# Patient Record
Sex: Male | Born: 1950 | Race: White | Hispanic: No | Marital: Married | Smoking: Never smoker
Health system: Southern US, Community
[De-identification: ages and names within clinical notes are randomized; demographics above are authoritative.]

## PROBLEM LIST (undated history)

## (undated) DIAGNOSIS — E872 Acidosis, unspecified: Secondary | ICD-10-CM

## (undated) DIAGNOSIS — Z87442 Personal history of urinary calculi: Secondary | ICD-10-CM

## (undated) DIAGNOSIS — I209 Angina pectoris, unspecified: Secondary | ICD-10-CM

## (undated) DIAGNOSIS — R296 Repeated falls: Secondary | ICD-10-CM

## (undated) DIAGNOSIS — I255 Ischemic cardiomyopathy: Secondary | ICD-10-CM

## (undated) DIAGNOSIS — K810 Acute cholecystitis: Secondary | ICD-10-CM

## (undated) DIAGNOSIS — I509 Heart failure, unspecified: Secondary | ICD-10-CM

## (undated) DIAGNOSIS — M545 Low back pain, unspecified: Secondary | ICD-10-CM

## (undated) DIAGNOSIS — K219 Gastro-esophageal reflux disease without esophagitis: Secondary | ICD-10-CM

## (undated) DIAGNOSIS — I2510 Atherosclerotic heart disease of native coronary artery without angina pectoris: Secondary | ICD-10-CM

## (undated) DIAGNOSIS — L03211 Cellulitis of face: Secondary | ICD-10-CM

## (undated) DIAGNOSIS — I10 Essential (primary) hypertension: Secondary | ICD-10-CM

## (undated) DIAGNOSIS — E119 Type 2 diabetes mellitus without complications: Secondary | ICD-10-CM

## (undated) DIAGNOSIS — E113492 Type 2 diabetes mellitus with severe nonproliferative diabetic retinopathy without macular edema, left eye: Secondary | ICD-10-CM

## (undated) DIAGNOSIS — R7989 Other specified abnormal findings of blood chemistry: Secondary | ICD-10-CM

## (undated) DIAGNOSIS — Z87820 Personal history of traumatic brain injury: Secondary | ICD-10-CM

## (undated) DIAGNOSIS — I639 Cerebral infarction, unspecified: Secondary | ICD-10-CM

## (undated) DIAGNOSIS — G629 Polyneuropathy, unspecified: Secondary | ICD-10-CM

## (undated) DIAGNOSIS — G8929 Other chronic pain: Secondary | ICD-10-CM

## (undated) DIAGNOSIS — E113391 Type 2 diabetes mellitus with moderate nonproliferative diabetic retinopathy without macular edema, right eye: Secondary | ICD-10-CM

## (undated) HISTORY — PX: CORONARY ARTERY BYPASS GRAFT: SHX141

## (undated) SURGERY — LOWER EXTREMITY ANGIOGRAPHY
Anesthesia: Moderate Sedation | Laterality: Right

---

## 2005-05-24 ENCOUNTER — Ambulatory Visit: Payer: Self-pay | Admitting: Family Medicine

## 2008-01-25 ENCOUNTER — Other Ambulatory Visit: Payer: Self-pay

## 2008-01-25 ENCOUNTER — Observation Stay: Payer: Self-pay | Admitting: Internal Medicine

## 2009-03-03 ENCOUNTER — Emergency Department: Payer: Self-pay | Admitting: Emergency Medicine

## 2009-03-05 ENCOUNTER — Emergency Department: Payer: Self-pay | Admitting: Emergency Medicine

## 2009-11-19 ENCOUNTER — Emergency Department: Payer: Self-pay | Admitting: Internal Medicine

## 2010-08-27 DIAGNOSIS — E119 Type 2 diabetes mellitus without complications: Secondary | ICD-10-CM

## 2010-08-27 DIAGNOSIS — E1169 Type 2 diabetes mellitus with other specified complication: Secondary | ICD-10-CM

## 2012-10-24 DIAGNOSIS — I639 Cerebral infarction, unspecified: Secondary | ICD-10-CM

## 2012-10-24 HISTORY — DX: Cerebral infarction, unspecified: I63.9

## 2014-07-04 ENCOUNTER — Emergency Department: Payer: Self-pay | Admitting: Emergency Medicine

## 2014-07-20 ENCOUNTER — Emergency Department: Payer: Self-pay | Admitting: Emergency Medicine

## 2014-08-27 ENCOUNTER — Ambulatory Visit: Payer: Self-pay | Admitting: Nurse Practitioner

## 2014-08-29 ENCOUNTER — Ambulatory Visit: Payer: Self-pay | Admitting: Nurse Practitioner

## 2014-10-16 ENCOUNTER — Ambulatory Visit: Payer: Self-pay | Admitting: Vascular Surgery

## 2015-10-25 DIAGNOSIS — Z951 Presence of aortocoronary bypass graft: Secondary | ICD-10-CM

## 2015-10-25 HISTORY — PX: CORONARY ARTERY BYPASS GRAFT: SHX141

## 2015-10-25 HISTORY — DX: Presence of aortocoronary bypass graft: Z95.1

## 2015-12-11 DIAGNOSIS — E113492 Type 2 diabetes mellitus with severe nonproliferative diabetic retinopathy without macular edema, left eye: Secondary | ICD-10-CM | POA: Insufficient documentation

## 2015-12-11 DIAGNOSIS — E113391 Type 2 diabetes mellitus with moderate nonproliferative diabetic retinopathy without macular edema, right eye: Secondary | ICD-10-CM | POA: Insufficient documentation

## 2016-07-29 DIAGNOSIS — I420 Dilated cardiomyopathy: Secondary | ICD-10-CM | POA: Insufficient documentation

## 2016-07-29 DIAGNOSIS — I255 Ischemic cardiomyopathy: Secondary | ICD-10-CM | POA: Insufficient documentation

## 2016-08-09 ENCOUNTER — Encounter
Admission: RE | Disposition: A | Discharge status: Home / Self Care | Payer: Self-pay | Source: Ambulatory Visit | Attending: Cardiology

## 2016-08-09 ENCOUNTER — Other Ambulatory Visit: Payer: Self-pay | Admitting: Cardiology

## 2016-08-09 ENCOUNTER — Ambulatory Visit
Admission: RE | Admit: 2016-08-09 | Discharge: 2016-08-09 | Disposition: A | Discharge status: Home / Self Care | Payer: BLUE CROSS/BLUE SHIELD | Source: Ambulatory Visit | Attending: Cardiology | Admitting: Cardiology

## 2016-08-09 ENCOUNTER — Ambulatory Visit: Admit: 2016-08-09 | Payer: BLUE CROSS/BLUE SHIELD | Admitting: Cardiology

## 2016-08-09 DIAGNOSIS — I255 Ischemic cardiomyopathy: Secondary | ICD-10-CM | POA: Insufficient documentation

## 2016-08-09 DIAGNOSIS — E113391 Type 2 diabetes mellitus with moderate nonproliferative diabetic retinopathy without macular edema, right eye: Secondary | ICD-10-CM | POA: Insufficient documentation

## 2016-08-09 DIAGNOSIS — Z8673 Personal history of transient ischemic attack (TIA), and cerebral infarction without residual deficits: Secondary | ICD-10-CM | POA: Diagnosis not present

## 2016-08-09 DIAGNOSIS — R809 Proteinuria, unspecified: Secondary | ICD-10-CM | POA: Insufficient documentation

## 2016-08-09 DIAGNOSIS — I420 Dilated cardiomyopathy: Secondary | ICD-10-CM | POA: Diagnosis not present

## 2016-08-09 DIAGNOSIS — I2510 Atherosclerotic heart disease of native coronary artery without angina pectoris: Secondary | ICD-10-CM | POA: Insufficient documentation

## 2016-08-09 DIAGNOSIS — Z7982 Long term (current) use of aspirin: Secondary | ICD-10-CM | POA: Diagnosis not present

## 2016-08-09 DIAGNOSIS — Z794 Long term (current) use of insulin: Secondary | ICD-10-CM | POA: Insufficient documentation

## 2016-08-09 DIAGNOSIS — R079 Chest pain, unspecified: Secondary | ICD-10-CM | POA: Diagnosis present

## 2016-08-09 DIAGNOSIS — E113492 Type 2 diabetes mellitus with severe nonproliferative diabetic retinopathy without macular edema, left eye: Secondary | ICD-10-CM | POA: Diagnosis not present

## 2016-08-09 DIAGNOSIS — E1169 Type 2 diabetes mellitus with other specified complication: Secondary | ICD-10-CM | POA: Diagnosis not present

## 2016-08-09 DIAGNOSIS — I10 Essential (primary) hypertension: Secondary | ICD-10-CM | POA: Insufficient documentation

## 2016-08-09 DIAGNOSIS — Z79899 Other long term (current) drug therapy: Secondary | ICD-10-CM | POA: Diagnosis not present

## 2016-08-09 DIAGNOSIS — I2582 Chronic total occlusion of coronary artery: Secondary | ICD-10-CM | POA: Insufficient documentation

## 2016-08-09 HISTORY — PX: CARDIAC CATHETERIZATION: SHX172

## 2016-08-09 HISTORY — DX: Atherosclerotic heart disease of native coronary artery without angina pectoris: I25.10

## 2016-08-09 HISTORY — DX: Cerebral infarction, unspecified: I63.9

## 2016-08-09 HISTORY — DX: Type 2 diabetes mellitus without complications: E11.9

## 2016-08-09 HISTORY — DX: Angina pectoris, unspecified: I20.9

## 2016-08-09 HISTORY — DX: Essential (primary) hypertension: I10

## 2016-08-09 SURGERY — LEFT HEART CATH AND CORONARY ANGIOGRAPHY
Anesthesia: Moderate Sedation | Laterality: Left

## 2016-08-09 SURGERY — LEFT HEART CATH
Anesthesia: Moderate Sedation

## 2016-08-09 MED ORDER — FUROSEMIDE 10 MG/ML IJ SOLN
INTRAMUSCULAR | Status: DC | PRN
  Administered 2016-08-09: 20 mg via INTRAVENOUS

## 2016-08-09 MED ORDER — SODIUM CHLORIDE 0.9% FLUSH: 3.0000 mL | INTRAVENOUS | Status: DC | PRN

## 2016-08-09 MED ORDER — SODIUM CHLORIDE 0.9 % WEIGHT BASED INFUSION: 1.0000 mL/kg/h | INTRAVENOUS | Status: DC

## 2016-08-09 MED ORDER — NITROGLYCERIN 2 % TD OINT
TOPICAL_OINTMENT | TRANSDERMAL | Status: DC | PRN
  Administered 2016-08-09: 2 [in_us] via TOPICAL

## 2016-08-09 MED ORDER — ASPIRIN EC 325 MG PO TBEC: 325.0000 mg | DELAYED_RELEASE_TABLET | Freq: Every day | ORAL | Status: DC

## 2016-08-09 MED ORDER — FUROSEMIDE 10 MG/ML IJ SOLN
INTRAMUSCULAR | Status: AC
  Filled 2016-08-09: qty 2

## 2016-08-09 MED ORDER — MIDAZOLAM HCL 2 MG/2ML IJ SOLN
INTRAMUSCULAR | Status: DC | PRN
  Administered 2016-08-09: 0.5 mg via INTRAVENOUS
  Administered 2016-08-09: 1.5 mg

## 2016-08-09 MED ORDER — SODIUM CHLORIDE 0.9 % IV SOLN: 250.0000 mL | INTRAVENOUS | Status: DC | PRN

## 2016-08-09 MED ORDER — SODIUM CHLORIDE 0.9% FLUSH: 3.0000 mL | Freq: Two times a day (BID) | INTRAVENOUS | Status: DC

## 2016-08-09 MED ORDER — IOPAMIDOL (ISOVUE-300) INJECTION 61%
INTRAVENOUS | Status: DC | PRN
  Administered 2016-08-09: 100 mL via INTRA_ARTERIAL

## 2016-08-09 MED ORDER — ONDANSETRON HCL 4 MG/2ML IJ SOLN: 4.0000 mg | Freq: Four times a day (QID) | INTRAMUSCULAR | Status: DC | PRN

## 2016-08-09 MED ORDER — MIDAZOLAM HCL 2 MG/2ML IJ SOLN
INTRAMUSCULAR | Status: AC
Start: 2016-08-09 — End: 2016-08-09
  Filled 2016-08-09: qty 2

## 2016-08-09 MED ORDER — FENTANYL CITRATE (PF) 100 MCG/2ML IJ SOLN
INTRAMUSCULAR | Status: DC | PRN
  Administered 2016-08-09: 25 ug
  Administered 2016-08-09: 75 ug

## 2016-08-09 MED ORDER — FENTANYL CITRATE (PF) 100 MCG/2ML IJ SOLN
INTRAMUSCULAR | Status: AC
  Filled 2016-08-09: qty 2

## 2016-08-09 MED ORDER — ASPIRIN 81 MG PO CHEW
81.0000 mg | CHEWABLE_TABLET | ORAL | Status: DC
Start: 2016-08-10 — End: 2016-08-09

## 2016-08-09 MED ORDER — ACETAMINOPHEN 325 MG PO TABS: 650.0000 mg | ORAL_TABLET | ORAL | Status: DC | PRN

## 2016-08-09 MED ORDER — ATORVASTATIN CALCIUM 20 MG PO TABS: 80.0000 mg | ORAL_TABLET | Freq: Every day | ORAL | Status: DC

## 2016-08-09 MED ORDER — SODIUM CHLORIDE 0.9 % IV SOLN: INTRAVENOUS | Status: DC

## 2016-08-09 MED ORDER — HEPARIN (PORCINE) IN NACL 2-0.9 UNIT/ML-% IJ SOLN
INTRAMUSCULAR | Status: AC
Start: 2016-08-09 — End: 2016-08-09
  Filled 2016-08-09: qty 500

## 2016-08-09 MED ORDER — NITROGLYCERIN 2 % TD OINT
TOPICAL_OINTMENT | TRANSDERMAL | Status: AC
Start: 2016-08-09 — End: 2016-08-09
  Filled 2016-08-09: qty 1

## 2016-08-09 SURGICAL SUPPLY — 8 items
CATH 5FR JL4 DIAGNOSTIC (CATHETERS) ×3 IMPLANT
CATH 5FR PIGTAIL DIAGNOSTIC (CATHETERS) ×2 IMPLANT
CATH INFINITI JR4 5F (CATHETERS) ×3 IMPLANT
KIT MANI 3VAL PERCEP (MISCELLANEOUS) ×3 IMPLANT
NEEDLE PERC 18GX7CM (NEEDLE) ×3 IMPLANT
PACK CARDIAC CATH (CUSTOM PROCEDURE TRAY) ×3 IMPLANT
SHEATH AVANTI 5FR X 11CM (SHEATH) ×3 IMPLANT
WIRE EMERALD 3MM-J .035X150CM (WIRE) ×3 IMPLANT

## 2016-08-09 NOTE — H&P (Signed)
Chief Complaint: Chief Complaint  Patient presents with  . Follow-up  stress echo  Date of Service: 07/29/2016 Date of Birth: [birth year] PCP: Maralyn Sago Burnis Medin, NP  History of Present Illness: [name] is a 65 y.o.male patient who presents for evaluation. Has history of exertional chest pain and shortness of breath. Patient underwent a stress echo which revealed reduced LV function EF 25%. EF reduces globally with exercise. He has mild MR trivial TR. Etiology of this is unclear. Will need cardiac catheterization to evaluate coronary anatomy as etiology. Risk and benefits of left cardiac catheterization were explained. Will need to improve medical therapy including evidence-based beta-blocker and afterload reduction. Low-sodium diet is recommended along with daily weights. Past Medical and Surgical History  Past Medical History Past Medical History:  Diagnosis Date  . Carotid artery stenosis 11/15  . Chronic lower back pain  w/ weakness down Rt leg & inability to flex Rt foot  . Concussion 09/15  2/2 slip & fall  . Diabetes mellitus, type 2 (CMS-HCC) ~2006  . Frequent falls  2/2 inability to flex Rt foot  . Hypertension ~2013  . Microalbuminuria due to type 2 diabetes mellitus (CMS-HCC)  . 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 Followed by [facility]  . Protrusion of lumbar intervertebral disc 11/15  w/ spinal stenosis. L3-S1  . Severe nonproliferative diabetic retinopathy of left eye (CMS-HCC) 12/11/2015  s/p antiveg-f injections. Followed by [facility].  . Stroke (CMS-HCC) June 2014  Lt parietal lobe lesion seen on Head CT, but no lesions seen on flu MRI.   Past Surgical History He has no past surgical history on file.   Medications and Allergies  Current Medications  Current Outpatient Prescriptions  Medication Sig Dispense Refill  . aspirin 81 MG EC tablet Take 81 mg by mouth once daily.  . BD  INSULIN SYRINGE HALF UNIT 0.3 mL 31 gauge x 5/16" Syrg Inject 1 Syringe subcutaneously 2 (two) times daily. 100 Syringe 5  . enalapril (VASOTEC) 10 MG tablet TAKE ONE (1) TABLET BY MOUTH EVERY DAY 90 tablet 1  . FREESTYLE LITE STRIPS test strip USE TWICE A DAY (TEST IN ROTATING PATTERN THROUGHOUT DAY) 100 each 5  . ibuprofen (ADVIL,MOTRIN) 200 MG tablet Take 200 mg by mouth every 6 (six) hours as needed for Pain.  . metFORMIN (GLUCOPHAGE) 1000 MG tablet TAKE ONE TABLET BY MOUTH TWICE DAILY WITH MEALS 180 tablet 1  . NOVOLIN 70/30 100 unit/mL (70-30) injection INJECT 20-24 UNITS SUBQ TWICE DAILY BEFORE MEALS. 24 UNITS BEFORE BREAKFAST, 20 UNITS BEFORE SUPPER 40 mL 6  . carvedilol (COREG) 3.125 MG tablet Take 1 tablet (3.125 mg total) by mouth 2 (two) times daily with meals. 60 tablet 11   No current facility-administered medications for this visit.   Allergies: Review of patient's allergies indicates no known allergies.  Social and Family History  Social History reports that he has never smoked. He has never used smokeless tobacco. He reports that he does not drink alcohol or use illicit drugs.  Family History Family History  Problem Relation Age of Onset  . Diabetes type II Mother  . Hypertension Mother  . Stroke Mother  Cause of death vs DM coma  . Alcohol abuse Father  . Heart attack Brother  . Coronary artery disease Brother  s/p CABG x3 (11/15)   Review of Systems  Review of Systems  Constitutional: Negative for chills, diaphoresis, fever, malaise/fatigue and weight loss.  HENT: Negative for congestion, ear discharge, hearing loss and tinnitus.  Eyes: Negative for blurred vision.  Respiratory: Positive for shortness of breath. Negative for cough, hemoptysis, sputum production and wheezing.  Cardiovascular: Positive for chest pain. Negative for palpitations, orthopnea, claudication, leg swelling and PND.  Gastrointestinal: Negative for abdominal pain, blood in stool,  constipation, diarrhea, heartburn, melena, nausea and vomiting.  Genitourinary: Negative for dysuria, frequency, hematuria and urgency.  Musculoskeletal: Negative for back pain, falls, joint pain and myalgias.  Skin: Negative for itching and rash.  Neurological: Negative for dizziness, tingling, focal weakness, loss of consciousness, weakness and headaches.  Endo/Heme/Allergies: Negative for polydipsia. Does not bruise/bleed easily.  Psychiatric/Behavioral: Negative for depression, memory loss and substance abuse. The patient is not nervous/anxious.   Physical Examination   Vitals:BP 140/82  Pulse 76  Resp 12  Ht 182.9 cm (6')  Wt (!) 115.7 kg (255 lb)  BMI 34.58 kg/m2 Ht:182.9 cm (6') Wt:(!) 115.7 kg (255 lb) ZOX:WRUE surface area is 2.42 meters squared. Body mass index is 34.58 kg/(m^2).  Wt Readings from Last 3 Encounters:  07/29/16 (!) 115.7 kg (255 lb)  07/21/16 (!) 115.7 kg (255 lb)  06/03/16 (!) 113.9 kg (251 lb)   BP Readings from Last 3 Encounters:  07/29/16 140/82  07/21/16 148/80  06/03/16 120/74   General appearance appears in no acute distress  Head Mouth and Eye exam Normocephalic, without obvious abnormality, atraumatic Dentition is good Eyes appear anicteric   Neck exam Thyroid: normal  Nodes: no obvious adenopathy  LUNGS Breath Sounds: Normal Percussion: Normal  CARDIOVASCULAR JVP CV wave: no HJR: no Elevation at 90 degrees: None Carotid Pulse: normal pulsation bilaterally Bruit: None Apex: apical impulse normal  Auscultation Rhythm: normal sinus rhythm S1: normal S2: normal Clicks: no Rub: no Murmurs: no murmurs  Gallop: None ABDOMEN Liver enlargement: no Pulsatile aorta: no Ascites: no Bruits: no  EXTREMITIES Clubbing: no Edema: trace to 1+ bilateral pedal edema Pulses: peripheral pulses symmetrical Femoral Bruits: no Amputation: no SKIN Rash: no Cyanosis: no Embolic phemonenon: no Bruising: no NEURO Alert and  Oriented to person, place and time: yes Non focal: yes  PSYCH: Pt appears to have normal affect  LABS REVIEWED Last 3 CBC results: Lab Results  Component Value Date  WBC 7.6 07/29/2016  WBC 6.9 11/25/2015  WBC 8.2 08/20/2014   Lab Results  Component Value Date  HGB 14.7 07/29/2016  HGB 13.9 (L) 11/25/2015  HGB 14.8 08/20/2014   Lab Results  Component Value Date  HCT 41.1 07/29/2016  HCT 39.9 (L) 11/25/2015  HCT 41.9 08/20/2014   Lab Results  Component Value Date  PLT 196 07/29/2016  PLT 206 11/25/2015  PLT 228 08/20/2014   Lab Results  Component Value Date  CREATININE 0.9 07/29/2016  BUN 21 07/29/2016  NA 136 07/29/2016  K 4.7 07/29/2016  CL 102 07/29/2016  CO2 27.9 07/29/2016   Lab Results  Component Value Date  HGBA1C 8.5 (H) 06/03/2016   Lab Results  Component Value Date  HDL 47.2 11/25/2015  HDL 37.0 08/20/2014   Lab Results  Component Value Date  LDLCALC 68 11/25/2015  LDLCALC 72 08/20/2014   Lab Results  Component Value Date  TRIG 75 11/25/2015  TRIG 131 08/20/2014   Lab Results  Component Value Date  ALT 30 11/25/2015  AST 23 11/25/2015  ALKPHOS 55 11/25/2015   Lab Results  Component Value Date  TSH 2.469 11/25/2015   Diagnostic Studies Reviewed:  EKG EKG demonstrated normal sinus rhythm, nonspecific  ST and T waves changes, Q waves in Inferior leads.  Assessment and Plan   65 y.o. male with  ICD-10-CM ICD-9-CM  1. Essential hypertension-low-sodium diet. Will follow for now I10 401.9  2. SOB (shortness of breath) on exertion-EF 25%. This is global but worsens with exercise. Concern over possible three-vessel coronary disease as etiology. We will proceed with left cardiac catheterization to evaluate. Will start on carvedilol at 3.125 mg twice daily and enalapril at 10 mg daily which had been previously started. R06.02 786.05  3. Type 2 diabetes mellitus without complication, without long-term current use of insulin  (CMS-HCC)-continue with metformin and insulin. Will hold metformin prior to catheterization until renal function can be documented post procedure. E11.9 250.00  4. Atypical chest pain--EF is reduced to 25%. There is some worsening with exercise. Concerned over possible ischemic etiology. Will treat with aspirin, evidence-based beta-blocker and afterload reduction proceed with left cardiac catheterization to evaluate coronary anatomy with further recognitions after this is complete. Will need to add a statin however will do this after the cardiac catheterization so that we are not starting 2 medications at once. Given his diabetes regardless of the coronary anatomy, his LDL needs to be less than 409. His current LDL is 68. R07.89 786.59   5. Essential (primary) hypertension-treated with carvedilol and enalapril and low-sodium DASH diet. I10 401.9  6. Cardiomyopathy, dilated (CMS-HCC)-we will pursue left cardiac catheterization to evaluate. As per above, carvedilol and ACE inhibitor. I42.0 425.4   Return in about 4 weeks (around 08/26/2016).  These notes generated with voice recognition software. I apologize for typographical errors.  Denton Ar, MD    H and P reviewed. No changes.

## 2016-08-09 NOTE — Discharge Instructions (Signed)
Angiogram, Care After °Refer to this sheet in the next few weeks. These instructions provide you with information about caring for yourself after your procedure. Your health care provider may also give you more specific instructions. Your treatment has been planned according to current medical practices, but problems sometimes occur. Call your health care provider if you have any problems or questions after your procedure. °WHAT TO EXPECT AFTER THE PROCEDURE °After your procedure, it is typical to have the following: °· Bruising at the catheter insertion site that usually fades within 1-2 weeks. °· Blood collecting in the tissue (hematoma) that may be painful to the touch. It should usually decrease in size and tenderness within 1-2 weeks. °HOME CARE INSTRUCTIONS °· Take medicines only as directed by your health care provider. °· You may shower 24-48 hours after the procedure or as directed by your health care provider. Remove the bandage (dressing) and gently wash the site with plain soap and water. Pat the area dry with a clean towel. Do not rub the site, because this may cause bleeding. °· Do not take baths, swim, or use a hot tub until your health care provider approves. °· Check your insertion site every day for redness, swelling, or drainage. °· Do not apply powder or lotion to the site. °· Do not lift over 10 lb (4.5 kg) for 5 days after your procedure or as directed by your health care provider. °· Ask your health care provider when it is okay to: °¨ Return to work or school. °¨ Resume usual physical activities or sports. °¨ Resume sexual activity. °· Do not drive home if you are discharged the same day as the procedure. Have someone else drive you. °· You may drive 24 hours after the procedure unless otherwise instructed by your health care provider. °· Do not operate machinery or power tools for 24 hours after the procedure or as directed by your health care provider. °· If your procedure was done as an  outpatient procedure, which means that you went home the same day as your procedure, a responsible adult should be with you for the first 24 hours after you arrive home. °· Keep all follow-up visits as directed by your health care provider. This is important. °SEEK MEDICAL CARE IF: °· You have a fever. °· You have chills. °· You have increased bleeding from the catheter insertion site. Hold pressure on the site. °SEEK IMMEDIATE MEDICAL CARE IF: °· You have unusual pain at the catheter insertion site. °· You have redness, warmth, or swelling at the catheter insertion site. °· You have drainage (other than a small amount of blood on the dressing) from the catheter insertion site. °· The catheter insertion site is bleeding, and the bleeding does not stop after 30 minutes of holding steady pressure on the site. °· The area near or just beyond the catheter insertion site becomes pale, cool, tingly, or numb. °  °This information is not intended to replace advice given to you by your health care provider. Make sure you discuss any questions you have with your health care provider. °  °Document Released: 04/28/2005 Document Revised: 10/31/2014 Document Reviewed: 03/13/2013 °Elsevier Interactive Patient Education ©2016 [employer] ° °

## 2016-08-10 ENCOUNTER — Encounter: Payer: Self-pay | Admitting: Cardiology

## 2016-08-24 DIAGNOSIS — I5022 Chronic systolic (congestive) heart failure: Secondary | ICD-10-CM | POA: Diagnosis present

## 2018-01-01 ENCOUNTER — Other Ambulatory Visit: Payer: Self-pay | Admitting: Student

## 2018-01-01 DIAGNOSIS — M5416 Radiculopathy, lumbar region: Secondary | ICD-10-CM

## 2018-01-10 ENCOUNTER — Ambulatory Visit
Admission: RE | Admit: 2018-01-10 | Discharge: 2018-01-10 | Disposition: A | Discharge status: Home / Self Care | Payer: Medicare Other | Source: Ambulatory Visit | Attending: Student | Admitting: Student

## 2018-01-10 DIAGNOSIS — M5127 Other intervertebral disc displacement, lumbosacral region: Secondary | ICD-10-CM | POA: Insufficient documentation

## 2018-01-10 DIAGNOSIS — M48061 Spinal stenosis, lumbar region without neurogenic claudication: Secondary | ICD-10-CM | POA: Diagnosis not present

## 2018-01-10 DIAGNOSIS — M5116 Intervertebral disc disorders with radiculopathy, lumbar region: Secondary | ICD-10-CM | POA: Diagnosis not present

## 2018-01-10 DIAGNOSIS — M5416 Radiculopathy, lumbar region: Secondary | ICD-10-CM

## 2018-01-24 DIAGNOSIS — G629 Polyneuropathy, unspecified: Secondary | ICD-10-CM | POA: Insufficient documentation

## 2019-03-16 ENCOUNTER — Emergency Department: Payer: Medicare Other

## 2019-03-16 ENCOUNTER — Encounter: Payer: Self-pay | Admitting: Emergency Medicine

## 2019-03-16 ENCOUNTER — Inpatient Hospital Stay
Admission: EM | Admit: 2019-03-16 | Discharge: 2019-03-18 | DRG: 603 | Disposition: A | Discharge status: Home / Self Care | Payer: Medicare Other | Attending: Internal Medicine | Admitting: Internal Medicine

## 2019-03-16 ENCOUNTER — Other Ambulatory Visit: Payer: Self-pay

## 2019-03-16 DIAGNOSIS — L03211 Cellulitis of face: Principal | ICD-10-CM | POA: Diagnosis present

## 2019-03-16 DIAGNOSIS — E871 Hypo-osmolality and hyponatremia: Secondary | ICD-10-CM | POA: Diagnosis present

## 2019-03-16 DIAGNOSIS — L03213 Periorbital cellulitis: Secondary | ICD-10-CM

## 2019-03-16 DIAGNOSIS — Z794 Long term (current) use of insulin: Secondary | ICD-10-CM

## 2019-03-16 DIAGNOSIS — R112 Nausea with vomiting, unspecified: Secondary | ICD-10-CM | POA: Diagnosis not present

## 2019-03-16 DIAGNOSIS — E1165 Type 2 diabetes mellitus with hyperglycemia: Secondary | ICD-10-CM | POA: Diagnosis present

## 2019-03-16 DIAGNOSIS — Z791 Long term (current) use of non-steroidal anti-inflammatories (NSAID): Secondary | ICD-10-CM

## 2019-03-16 DIAGNOSIS — I509 Heart failure, unspecified: Secondary | ICD-10-CM | POA: Diagnosis present

## 2019-03-16 DIAGNOSIS — I2510 Atherosclerotic heart disease of native coronary artery without angina pectoris: Secondary | ICD-10-CM | POA: Diagnosis present

## 2019-03-16 DIAGNOSIS — Z79899 Other long term (current) drug therapy: Secondary | ICD-10-CM

## 2019-03-16 DIAGNOSIS — K529 Noninfective gastroenteritis and colitis, unspecified: Secondary | ICD-10-CM | POA: Diagnosis present

## 2019-03-16 DIAGNOSIS — R21 Rash and other nonspecific skin eruption: Secondary | ICD-10-CM | POA: Diagnosis present

## 2019-03-16 DIAGNOSIS — M21371 Foot drop, right foot: Secondary | ICD-10-CM | POA: Diagnosis present

## 2019-03-16 DIAGNOSIS — Z1159 Encounter for screening for other viral diseases: Secondary | ICD-10-CM

## 2019-03-16 DIAGNOSIS — E860 Dehydration: Secondary | ICD-10-CM | POA: Diagnosis present

## 2019-03-16 DIAGNOSIS — Z7982 Long term (current) use of aspirin: Secondary | ICD-10-CM

## 2019-03-16 DIAGNOSIS — I110 Hypertensive heart disease with heart failure: Secondary | ICD-10-CM | POA: Diagnosis present

## 2019-03-16 DIAGNOSIS — I420 Dilated cardiomyopathy: Secondary | ICD-10-CM | POA: Diagnosis present

## 2019-03-16 DIAGNOSIS — M21372 Foot drop, left foot: Secondary | ICD-10-CM | POA: Diagnosis present

## 2019-03-16 DIAGNOSIS — Z8673 Personal history of transient ischemic attack (TIA), and cerebral infarction without residual deficits: Secondary | ICD-10-CM

## 2019-03-16 LAB — CBC
HCT: 50.7 % (ref 39.0–52.0)
Hemoglobin: 17.2 g/dL — ABNORMAL HIGH (ref 13.0–17.0)
MCH: 32.1 pg (ref 26.0–34.0)
MCHC: 33.9 g/dL (ref 30.0–36.0)
MCV: 94.6 fL (ref 80.0–100.0)
Platelets: 154 10*3/uL (ref 150–400)
RBC: 5.36 MIL/uL (ref 4.22–5.81)
RDW: 12.9 % (ref 11.5–15.5)
WBC: 14.7 10*3/uL — ABNORMAL HIGH (ref 4.0–10.5)
nRBC: 0 % (ref 0.0–0.2)

## 2019-03-16 LAB — COMPREHENSIVE METABOLIC PANEL
ALT: 34 U/L (ref 0–44)
AST: 22 U/L (ref 15–41)
Albumin: 4.2 g/dL (ref 3.5–5.0)
Alkaline Phosphatase: 86 U/L (ref 38–126)
Anion gap: 15 (ref 5–15)
BUN: 33 mg/dL — ABNORMAL HIGH (ref 8–23)
CO2: 22 mmol/L (ref 22–32)
Calcium: 9.2 mg/dL (ref 8.9–10.3)
Chloride: 93 mmol/L — ABNORMAL LOW (ref 98–111)
Creatinine, Ser: 1.17 mg/dL (ref 0.61–1.24)
GFR calc Af Amer: >60 mL/min (ref >60)
GFR calc non Af Amer: >60 mL/min (ref >60)
Glucose, Bld: 276 mg/dL — ABNORMAL HIGH (ref 70–99)
Potassium: 4.4 mmol/L (ref 3.5–5.1)
Sodium: 130 mmol/L — ABNORMAL LOW (ref 135–145)
Total Bilirubin: 2.3 mg/dL — ABNORMAL HIGH (ref 0.3–1.2)
Total Protein: 8.6 g/dL — ABNORMAL HIGH (ref 6.5–8.1)

## 2019-03-16 LAB — LIPASE, BLOOD: Lipase: 23 U/L (ref 11–51)

## 2019-03-16 LAB — TROPONIN I: Troponin I: 0.03 ng/mL (ref <0.03)

## 2019-03-16 LAB — LACTIC ACID, PLASMA: Lactic Acid, Venous: 2.7 mmol/L (ref 0.5–1.9)

## 2019-03-16 MED ORDER — ACETAMINOPHEN 500 MG PO TABS
ORAL_TABLET | ORAL | Status: AC
  Administered 2019-03-16: 1000 mg via ORAL
  Filled 2019-03-16: qty 2

## 2019-03-16 MED ORDER — SODIUM CHLORIDE 0.9 % IV BOLUS
1000.0000 mL | Freq: Once | INTRAVENOUS | Status: AC
  Administered 2019-03-16: 1000 mL via INTRAVENOUS

## 2019-03-16 MED ORDER — SODIUM CHLORIDE 0.9% FLUSH: 3.0000 mL | Freq: Once | INTRAVENOUS | Status: DC

## 2019-03-16 MED ORDER — ONDANSETRON HCL 4 MG/2ML IJ SOLN
4.0000 mg | Freq: Once | INTRAMUSCULAR | Status: DC | PRN
  Filled 2019-03-16: qty 2

## 2019-03-16 MED ORDER — ACETAMINOPHEN 500 MG PO TABS
1000.0000 mg | ORAL_TABLET | Freq: Once | ORAL | Status: AC
  Administered 2019-03-16: 1000 mg via ORAL

## 2019-03-16 NOTE — ED Notes (Signed)
CRITICAL LAB: LACTIC is 2.7, [employer], Dr. Alphonzo Lemmings notified, orders received

## 2019-03-16 NOTE — ED Triage Notes (Signed)
Patient presents to Emergency Department via [name] from home with complaints of  N/V/D//fever/poor appetite and fatigue since last Thursday. Pt reports falling on Wednesday without injury.     Pt reports/denies taking insulin and tylenol this morning.   Speaking in complete coherent sentences. No acute breathing distress noted.  Pt appears with red mark on forehead.  Mask on.   History of DM

## 2019-03-16 NOTE — ED Provider Notes (Signed)
[facility] Emergency Department Provider Note  ____________________________________________   I have reviewed the triage vital signs and the nursing notes. Where available I have reviewed prior notes and, if possible and indicated, outside hospital notes.    HISTORY  Chief Complaint Emesis; Nausea; Diarrhea; and Fever    HPI [name] is a 68 y.o. male  Who has a history of CAD, CHF, DM hypertension CVA, patient seen and evaluated during the coronavirus epidemic during a time with low staffing states over last 3 days has had vomiting and occasional fevers.  No significant diarrhea but also loose stools.  He only vomited x2 today but he feels dehydrated.  His sugars have been a little bit elevated as well.  He denies any abdominal pain chest pain or shortness of breath or cough.  He states that he has been vomiting for 3 days.  He states he had a noncyclical fall couple days ago because he felt generally weak.  He did not hit his head is an opinion he did not pass out that was on Wednesday.  It was a day he started feeling bad.  He has not had any headaches or focal numbness or weakness.    Past Medical History:  Diagnosis Date  . Anginal pain (HCC)   . Coronary artery disease   . Diabetes mellitus without complication (HCC)   . Hypertension   . Stroke [facility](HCC)     Patient Active Problem List   Diagnosis Date Noted  . Hypertension 08/09/2016  . Cardiomyopathy, dilated (HCC) 07/29/2016    Past Surgical History:  Procedure Laterality Date  . CARDIAC CATHETERIZATION Left 08/09/2016   Procedure: Left Heart Cath and Coronary Angiography;  Surgeon: Dalia HeadingKenneth A Fath, MD;  Location: [facility] INVASIVE CV LAB;  Service: Cardiovascular;  Laterality: Left;    Prior to Admission medications   Medication Sig Start Date End Date Taking? Authorizing Provider  aspirin EC 81 MG tablet Take 81 mg by mouth daily.   Yes [provider]  carvedilol (COREG) 3.125 MG  tablet Take 3.125 mg by mouth 2 (two) times daily with a meal.   Yes [provider]  empagliflozin (JARDIANCE) 25 MG TABS tablet Take 25 mg by mouth daily. 12/27/18  Yes [provider]  enalapril (VASOTEC) 10 MG tablet Take 10 mg by mouth every other day.   Yes [provider]  furosemide (LASIX) 80 MG tablet Take 80 mg by mouth 2 (two) times a day. 08/25/16  Yes [provider]  insulin NPH-regular Human (NOVOLIN 70/30) (70-30) 100 UNIT/ML injection Inject 20 Units into the skin daily with supper.   Yes [provider]  acetaminophen (TYLENOL) 325 MG tablet Take 650 mg by mouth every 4 (four) hours as needed for pain.    [provider]  atorvastatin (LIPITOR) 80 MG tablet Take 80 mg by mouth daily. 02/04/19   [provider]  hydrochlorothiazide (HYDRODIURIL) 12.5 MG tablet Take 12.5 mg by mouth daily. 02/20/19   [provider]  ibuprofen (ADVIL,MOTRIN) 200 MG tablet Take 200 mg by mouth every 6 (six) hours as needed.    [provider]  insulin NPH-regular Human (NOVOLIN 70/30) (70-30) 100 UNIT/ML injection Inject 24 Units into the skin daily with breakfast.    [provider]  metFORMIN (GLUCOPHAGE) 1000 MG tablet Take 1,000 mg by mouth 2 (two) times daily with a meal.    [provider]    Allergies Patient has no known allergies.  History reviewed. No pertinent family history.  Social History Social History   Tobacco Use  . Smoking status: Never Smoker  . Smokeless tobacco: Never Used  Substance Use Topics  . Alcohol use: No  . Drug use: No    Review of Systems Constitutional: No fever/chills Eyes: No visual changes. ENT: No sore throat. No stiff neck no neck pain Cardiovascular: Denies chest pain. Respiratory: Denies shortness of breath. Gastrointestinal:   no vomiting.  No diarrhea.  No constipation. Genitourinary: Negative for dysuria. Musculoskeletal: Negative lower  extremity swelling Skin: Negative for rash. Neurological: Negative for severe headaches, focal weakness or numbness.   ____________________________________________   PHYSICAL EXAM:  VITAL SIGNS: ED Triage Vitals  Enc Vitals Group     BP 03/16/19 2152 (!) 151/62     Pulse Rate 03/16/19 2152 94     Resp 03/16/19 2152 20     Temp 03/16/19 2152 (!) 102.9 F (39.4 C)     Temp Source 03/16/19 2152 Oral     SpO2 03/16/19 2152 94 %     Weight 03/16/19 2154 255 lb (115.7 kg)     Height 03/16/19 2154 6' (1.829 m)     Head Circumference --      Peak Flow --      Pain Score 03/16/19 2153 0     Pain Loc --      Pain Edu? --      Excl. in GC? --     Constitutional: Alert and oriented.  Chronically ill-appearing but not in particular distress at this moment speaking full sentences. Eyes: Conjunctivae are normal Head: Atraumatic HEENT: No congestion/rhinnorhea. Mucous membranes are moist.  Oropharynx non-erythematous Neck:   Nontender with no meningismus, no masses, no stridor Cardiovascular: Normal rate, regular rhythm. Grossly normal heart sounds.  Good peripheral circulation. Respiratory: Normal respiratory effort.  No retractions. Lungs CTAB. Abdominal: Soft and nontender. No distention. No guarding no rebound obesity noted. GU: Normal external genitalia no evidence of infection or Fournier's particularly Back:  There is no focal tenderness or step off.  there is no midline tenderness there are no lesions noted. there is no CVA tenderness Musculoskeletal: No lower extremity tenderness, no upper extremity tenderness. No joint effusions, no DVT signs strong distal pulses no edema Neurologic:  Normal speech and language. No gross focal neurologic deficits are appreciated.  Skin:  Skin is warm, dry and intact there is some erythema noted to his forehead which is nontender.  No fluctuance noted.Marland Kitchen Psychiatric: Mood and affect are normal. Speech and behavior are  normal.  ____________________________________________   LABS (all labs ordered are listed, but only abnormal results are displayed)  Labs Reviewed  COMPREHENSIVE METABOLIC PANEL - Abnormal; Notable for the following components:      Result Value   Sodium 130 (*)    Chloride 93 (*)    Glucose, Bld 276 (*)    BUN 33 (*)    Total Protein 8.6 (*)    Total Bilirubin 2.3 (*)    All other components within normal limits  CBC - Abnormal; Notable for the following components:   WBC 14.7 (*)    Hemoglobin 17.2 (*)    All other components within normal limits  LACTIC ACID, PLASMA - Abnormal; Notable for the following components:   Lactic Acid, Venous 2.7 (*)    All other components within normal limits  TROPONIN I - Abnormal; Notable for the following components:   Troponin I 0.03 (*)    All other  components within normal limits  CULTURE, BLOOD (ROUTINE X 2)  CULTURE, BLOOD (ROUTINE X 2)  SARS CORONAVIRUS 2 (HOSPITAL ORDER, PERFORMED IN [place] [facility])  LIPASE, BLOOD  URINALYSIS, COMPLETE (UACMP) WITH MICROSCOPIC  LACTIC ACID, PLASMA    Pertinent labs  results that were available during my care of the patient were reviewed by me and considered in my medical decision making (see chart for details). ____________________________________________  EKG  I personally interpreted any EKGs ordered by me or triage  ____________________________________________  RADIOLOGY  Pertinent labs & imaging results that were available during my care of the patient were reviewed by me and considered in my medical decision making (see chart for details). If possible, patient and/or family made aware of any abnormal findings.  Dg Chest Port 1 View  Result Date: 03/16/2019 CLINICAL DATA:  Nausea and vomiting EXAM: PORTABLE CHEST 1 VIEW COMPARISON:  01/25/2008 FINDINGS: Cardiac shadow is stable. Postsurgical changes are now seen. The lungs are clear bilaterally. No bony abnormality is noted.  IMPRESSION: No acute abnormality seen. Electronically Signed   By: Alcide Clever M.D.   On: 03/16/2019 22:54   ____________________________________________    PROCEDURES  Procedure(s) performed: None  Procedures  Critical Care performed: None  ____________________________________________   INITIAL IMPRESSION / ASSESSMENT AND PLAN / ED COURSE  Pertinent labs & imaging results that were available during my care of the patient were reviewed by me and considered in my medical decision making (see chart for details).  Patient here with vomiting and diarrhea for 3 days as well as fever.  Most likely viral etiology honestly.  He is nontender in his abdomen but he is a diabetic male who has unfortunately suffering from obesity which does limit my exam to some extent.  Likely will need CT.  Low suspicion for ACS, no chest pain no shortness of breath, chest x-ray is reassuring I have added a troponin which is borderline, lactic is mildly elevated although certainly not enough to indicate significant sepsis.  Given CHF we are giving him IV fluids without a 30/kg bolus to start with as his pressure and vitals are otherwise reassuring.  Coronavirus is a possibility, that is pending.  White count is somewhat elevated which is very nonspecific finding.  However usually in coronavirus the white count is not this high liver function tests are reassuring bili is slightly elevated which is nonspecific, he does not have right upper quadrant abdominal pain.  Sodium is low, we are giving him saline, this is likely secondary to vomiting illness.  Sugars are high but his CO2 is 22 and his anion gap is reassuring.  Lipase is normal.  We will continue resuscitation with fluids antiemetics antipyretics, coronavirus is pending and I will image his abdomen.  Also I will obtain EKG.    ____________________________________________   FINAL CLINICAL IMPRESSION(S) / ED DIAGNOSES  Final diagnoses:  None      This  chart was dictated using voice recognition software.  Despite best efforts to proofread,  errors can occur which can change meaning.      Jeanmarie Plant, MD 03/17/19 0001

## 2019-03-17 ENCOUNTER — Encounter: Payer: Self-pay | Admitting: Radiology

## 2019-03-17 ENCOUNTER — Emergency Department: Payer: Medicare Other

## 2019-03-17 DIAGNOSIS — I110 Hypertensive heart disease with heart failure: Secondary | ICD-10-CM | POA: Diagnosis present

## 2019-03-17 DIAGNOSIS — Z79899 Other long term (current) drug therapy: Secondary | ICD-10-CM | POA: Diagnosis not present

## 2019-03-17 DIAGNOSIS — I509 Heart failure, unspecified: Secondary | ICD-10-CM | POA: Diagnosis present

## 2019-03-17 DIAGNOSIS — E871 Hypo-osmolality and hyponatremia: Secondary | ICD-10-CM | POA: Diagnosis present

## 2019-03-17 DIAGNOSIS — Z7982 Long term (current) use of aspirin: Secondary | ICD-10-CM | POA: Diagnosis not present

## 2019-03-17 DIAGNOSIS — I420 Dilated cardiomyopathy: Secondary | ICD-10-CM | POA: Diagnosis present

## 2019-03-17 DIAGNOSIS — Z1159 Encounter for screening for other viral diseases: Secondary | ICD-10-CM | POA: Diagnosis not present

## 2019-03-17 DIAGNOSIS — R112 Nausea with vomiting, unspecified: Secondary | ICD-10-CM | POA: Diagnosis present

## 2019-03-17 DIAGNOSIS — M21371 Foot drop, right foot: Secondary | ICD-10-CM | POA: Diagnosis present

## 2019-03-17 DIAGNOSIS — Z791 Long term (current) use of non-steroidal anti-inflammatories (NSAID): Secondary | ICD-10-CM | POA: Diagnosis not present

## 2019-03-17 DIAGNOSIS — Z794 Long term (current) use of insulin: Secondary | ICD-10-CM | POA: Diagnosis not present

## 2019-03-17 DIAGNOSIS — E860 Dehydration: Secondary | ICD-10-CM | POA: Diagnosis present

## 2019-03-17 DIAGNOSIS — K529 Noninfective gastroenteritis and colitis, unspecified: Secondary | ICD-10-CM | POA: Diagnosis present

## 2019-03-17 DIAGNOSIS — E1165 Type 2 diabetes mellitus with hyperglycemia: Secondary | ICD-10-CM | POA: Diagnosis present

## 2019-03-17 DIAGNOSIS — M21372 Foot drop, left foot: Secondary | ICD-10-CM | POA: Diagnosis present

## 2019-03-17 DIAGNOSIS — L03211 Cellulitis of face: Secondary | ICD-10-CM | POA: Diagnosis present

## 2019-03-17 DIAGNOSIS — R21 Rash and other nonspecific skin eruption: Secondary | ICD-10-CM | POA: Diagnosis present

## 2019-03-17 DIAGNOSIS — I2510 Atherosclerotic heart disease of native coronary artery without angina pectoris: Secondary | ICD-10-CM | POA: Diagnosis present

## 2019-03-17 DIAGNOSIS — Z8673 Personal history of transient ischemic attack (TIA), and cerebral infarction without residual deficits: Secondary | ICD-10-CM | POA: Diagnosis not present

## 2019-03-17 LAB — BASIC METABOLIC PANEL
Anion gap: 16 — ABNORMAL HIGH (ref 5–15)
Anion gap: 13 (ref 5–15)
BUN: 32 mg/dL — ABNORMAL HIGH (ref 8–23)
BUN: 33 mg/dL — ABNORMAL HIGH (ref 8–23)
CO2: 22 mmol/L (ref 22–32)
CO2: 22 mmol/L (ref 22–32)
Calcium: 8.5 mg/dL — ABNORMAL LOW (ref 8.9–10.3)
Calcium: 8.1 mg/dL — ABNORMAL LOW (ref 8.9–10.3)
Chloride: 96 mmol/L — ABNORMAL LOW (ref 98–111)
Chloride: 97 mmol/L — ABNORMAL LOW (ref 98–111)
Creatinine, Ser: 1.08 mg/dL (ref 0.61–1.24)
Creatinine, Ser: 1.1 mg/dL (ref 0.61–1.24)
GFR calc Af Amer: >60 mL/min (ref >60)
GFR calc Af Amer: >60 mL/min (ref >60)
GFR calc non Af Amer: >60 mL/min (ref >60)
GFR calc non Af Amer: >60 mL/min (ref >60)
Glucose, Bld: 216 mg/dL — ABNORMAL HIGH (ref 70–99)
Glucose, Bld: 192 mg/dL — ABNORMAL HIGH (ref 70–99)
Potassium: 4.2 mmol/L (ref 3.5–5.1)
Potassium: 3.9 mmol/L (ref 3.5–5.1)
Sodium: 134 mmol/L — ABNORMAL LOW (ref 135–145)
Sodium: 132 mmol/L — ABNORMAL LOW (ref 135–145)

## 2019-03-17 LAB — URINALYSIS, COMPLETE (UACMP) WITH MICROSCOPIC
Bilirubin Urine: NEGATIVE
Glucose, UA: >=500 mg/dL — AB
Ketones, ur: 80 mg/dL — AB
Leukocytes,Ua: NEGATIVE
Nitrite: NEGATIVE
Protein, ur: 30 mg/dL — AB
Specific Gravity, Urine: 1.029 (ref 1.005–1.030)
Squamous Epithelial / HPF: NONE SEEN (ref 0–5)
pH: 5 (ref 5.0–8.0)

## 2019-03-17 LAB — TROPONIN I: Troponin I: 0.03 ng/mL (ref <0.03)

## 2019-03-17 LAB — GLUCOSE, CAPILLARY
Glucose-Capillary: 197 mg/dL — ABNORMAL HIGH (ref 70–99)
Glucose-Capillary: 206 mg/dL — ABNORMAL HIGH (ref 70–99)
Glucose-Capillary: 189 mg/dL — ABNORMAL HIGH (ref 70–99)
Glucose-Capillary: 147 mg/dL — ABNORMAL HIGH (ref 70–99)

## 2019-03-17 LAB — LACTIC ACID, PLASMA
Lactic Acid, Venous: 2 mmol/L (ref 0.5–1.9)
Lactic Acid, Venous: 2.3 mmol/L (ref 0.5–1.9)

## 2019-03-17 LAB — CBC
HCT: 47 % (ref 39.0–52.0)
Hemoglobin: 15.8 g/dL (ref 13.0–17.0)
MCH: 31.7 pg (ref 26.0–34.0)
MCHC: 33.6 g/dL (ref 30.0–36.0)
MCV: 94.4 fL (ref 80.0–100.0)
Platelets: 141 10*3/uL — ABNORMAL LOW (ref 150–400)
RBC: 4.98 MIL/uL (ref 4.22–5.81)
RDW: 13 % (ref 11.5–15.5)
WBC: 13.7 10*3/uL — ABNORMAL HIGH (ref 4.0–10.5)
nRBC: 0 % (ref 0.0–0.2)

## 2019-03-17 LAB — SARS CORONAVIRUS 2 BY RT PCR (HOSPITAL ORDER, PERFORMED IN ~~LOC~~ HOSPITAL LAB): SARS Coronavirus 2: NEGATIVE

## 2019-03-17 MED ORDER — SODIUM CHLORIDE 0.9 % IV SOLN: INTRAVENOUS | Status: DC

## 2019-03-17 MED ORDER — CLINDAMYCIN HCL 150 MG PO CAPS
600.0000 mg | ORAL_CAPSULE | Freq: Three times a day (TID) | ORAL | Status: DC
  Filled 2019-03-17 (×2): qty 4

## 2019-03-17 MED ORDER — ENALAPRIL MALEATE 10 MG PO TABS
10.0000 mg | ORAL_TABLET | ORAL | Status: DC
  Administered 2019-03-17: 10 mg via ORAL
  Filled 2019-03-17: qty 1

## 2019-03-17 MED ORDER — CLINDAMYCIN PHOSPHATE 600 MG/50ML IV SOLN
600.0000 mg | Freq: Once | INTRAVENOUS | Status: AC
  Administered 2019-03-17: 600 mg via INTRAVENOUS
  Filled 2019-03-17: qty 50

## 2019-03-17 MED ORDER — ENOXAPARIN SODIUM 40 MG/0.4ML ~~LOC~~ SOLN
40.0000 mg | SUBCUTANEOUS | Status: DC
  Administered 2019-03-17: 40 mg via SUBCUTANEOUS
  Filled 2019-03-17: qty 0.4

## 2019-03-17 MED ORDER — IOPAMIDOL (ISOVUE-370) INJECTION 76%
100.0000 mL | Freq: Once | INTRAVENOUS | Status: AC | PRN
  Administered 2019-03-17: 100 mL via INTRAVENOUS
  Filled 2019-03-17: qty 100

## 2019-03-17 MED ORDER — INSULIN ASPART 100 UNIT/ML ~~LOC~~ SOLN
0.0000 [IU] | Freq: Three times a day (TID) | SUBCUTANEOUS | Status: DC
  Administered 2019-03-17: 3 [IU] via SUBCUTANEOUS
  Administered 2019-03-17: 5 [IU] via SUBCUTANEOUS
  Administered 2019-03-17 – 2019-03-18 (×2): 3 [IU] via SUBCUTANEOUS
  Filled 2019-03-17 (×4): qty 1

## 2019-03-17 MED ORDER — CLINDAMYCIN HCL 300 MG PO CAPS: 600.0000 mg | ORAL_CAPSULE | Freq: Three times a day (TID) | ORAL | 0 refills | Status: AC

## 2019-03-17 MED ORDER — INSULIN ASPART 100 UNIT/ML ~~LOC~~ SOLN: 0.0000 [IU] | Freq: Every day | SUBCUTANEOUS | Status: DC

## 2019-03-17 MED ORDER — INSULIN ASPART PROT & ASPART (70-30 MIX) 100 UNIT/ML ~~LOC~~ SUSP
20.0000 [IU] | Freq: Every day | SUBCUTANEOUS | Status: DC
  Administered 2019-03-17: 20 [IU] via SUBCUTANEOUS
  Filled 2019-03-17: qty 10

## 2019-03-17 MED ORDER — ADULT MULTIVITAMIN W/MINERALS CH
1.0000 | ORAL_TABLET | Freq: Every day | ORAL | Status: DC
  Administered 2019-03-17 – 2019-03-18 (×2): 1 via ORAL
  Filled 2019-03-17 (×2): qty 1

## 2019-03-17 MED ORDER — CLINDAMYCIN PHOSPHATE 600 MG/50ML IV SOLN
600.0000 mg | Freq: Three times a day (TID) | INTRAVENOUS | Status: DC
  Filled 2019-03-17 (×2): qty 50

## 2019-03-17 MED ORDER — ATORVASTATIN CALCIUM 20 MG PO TABS
80.0000 mg | ORAL_TABLET | Freq: Every day | ORAL | Status: DC
  Administered 2019-03-17: 80 mg via ORAL
  Filled 2019-03-17: qty 4

## 2019-03-17 MED ORDER — SODIUM CHLORIDE 0.9 % IV SOLN
INTRAVENOUS | Status: DC
  Administered 2019-03-17 – 2019-03-18 (×3): via INTRAVENOUS

## 2019-03-17 MED ORDER — METFORMIN HCL 500 MG PO TABS
1000.0000 mg | ORAL_TABLET | Freq: Two times a day (BID) | ORAL | Status: DC
  Administered 2019-03-17 – 2019-03-18 (×3): 1000 mg via ORAL
  Filled 2019-03-17 (×3): qty 2

## 2019-03-17 MED ORDER — INSULIN ASPART PROT & ASPART (70-30 MIX) 100 UNIT/ML ~~LOC~~ SUSP
24.0000 [IU] | Freq: Every day | SUBCUTANEOUS | Status: DC
  Administered 2019-03-17 – 2019-03-18 (×2): 24 [IU] via SUBCUTANEOUS
  Filled 2019-03-17 (×2): qty 10

## 2019-03-17 MED ORDER — ASPIRIN EC 81 MG PO TBEC
81.0000 mg | DELAYED_RELEASE_TABLET | Freq: Every day | ORAL | Status: DC
  Administered 2019-03-17 – 2019-03-18 (×2): 81 mg via ORAL
  Filled 2019-03-17 (×2): qty 1

## 2019-03-17 MED ORDER — CARVEDILOL 3.125 MG PO TABS
3.1250 mg | ORAL_TABLET | Freq: Two times a day (BID) | ORAL | Status: DC
  Administered 2019-03-17 – 2019-03-18 (×3): 3.125 mg via ORAL
  Filled 2019-03-17 (×3): qty 1

## 2019-03-17 MED ORDER — CLINDAMYCIN PHOSPHATE 600 MG/50ML IV SOLN
600.0000 mg | Freq: Three times a day (TID) | INTRAVENOUS | Status: DC
  Administered 2019-03-17 – 2019-03-18 (×3): 600 mg via INTRAVENOUS
  Filled 2019-03-17 (×5): qty 50

## 2019-03-17 MED ORDER — IOHEXOL 240 MG/ML SOLN
50.0000 mL | Freq: Once | INTRAMUSCULAR | Status: AC | PRN
  Administered 2019-03-17: 50 mL via ORAL
  Filled 2019-03-17: qty 50

## 2019-03-17 NOTE — Progress Notes (Signed)
Patient ID: [name], male   DOB: 03-28-1951, 68 y.o.   MRN: [id number] patient seen and examined. Came in with fever nausea vomiting and diarrhea. Our feels a lot better after IV hydration. Fever is low grade 99.5. Patient is able to tolerate PO diet. No more nausea vomiting or diarrhea. Unable to obtain stool for C. diff. Has facial cellulitis continue IV clindamycin. Lactic acid remains elevated at 2.3. Will continue IV clindamycin and IV hydration. Patient wanted to go home. Recommended he stay another day get IV fluids and ensure lactic acid trends down before discharging him.

## 2019-03-17 NOTE — Plan of Care (Signed)
  Problem: Clinical Measurements: Goal: Ability to avoid or minimize complications of infection will improve Outcome: Progressing   Problem: Skin Integrity: Goal: Skin integrity will improve Outcome: Progressing   Problem: Education: Goal: Knowledge of General Education information will improve Description Including pain rating scale, medication(s)/side effects and non-pharmacologic comfort measures Outcome: Progressing   Problem: Clinical Measurements: Goal: Respiratory complications will improve Outcome: Progressing Goal: Cardiovascular complication will be avoided Outcome: Progressing   Problem: Safety: Goal: Ability to remain free from injury will improve Outcome: Progressing   Problem: Skin Integrity: Goal: Risk for impaired skin integrity will decrease Outcome: Progressing

## 2019-03-17 NOTE — Progress Notes (Signed)
Dr. Allena Katz informed of lactic acid of 2.3.  New orders received.

## 2019-03-17 NOTE — ED Notes (Signed)
Admitting provider at bedside.

## 2019-03-17 NOTE — Progress Notes (Signed)
Writer provided status update to pt's wife, Larita Fife.

## 2019-03-17 NOTE — H&P (Addendum)
[facility] -  at [facility]   PATIENT NAME: Alexander [name]    MR#:  [id number]  DATE OF BIRTH:  [birth year]  DATE OF ADMISSION:  03/16/2019  PRIMARY CARE PHYSICIAN: Bayard Males Hermenia Fiscal, NP   REQUESTING/REFERRING PHYSICIAN: Ileana Roup, MD  CHIEF COMPLAINT:   Chief Complaint  Patient presents with   Emesis   Nausea   Diarrhea   Fever    HISTORY OF PRESENT ILLNESS:  Alexander [name]  is a 68 y.o. male with a known history of coronary artery disease, diabetes mellitus, hypertension, stroke.  He presented to the emergency room complaining of fever chills with nausea, vomiting, and mild diarrhea over the last 3 days now with increasing weakness and malaise.  Patient reports having fallen on 03/13/2019 on an outdoor wooden deck.  He does not recall hitting his head or loss of consciousness at that time.  He has a history of diabetes and has noted elevated glucose levels over the last 3 days.  He denies abdominal pain, shortness of breath, cough, chest pain.  He denies headache or unilateral weakness.  He denies numbness or tingling-paresthesias.  He denies diplopia or vision disturbance.  Glucose is 276 on his arrival to the emergency room.  Lactic acid is 2.0.  However patient is noted to have leukocytosis with WBC 14.7 and fever of 103 on arrival.  Sodium is 130.  Troponin is 0.03.  Patient is noted to have a noticeable red raised rash on his face consisting of his nose, the bridge of his nose, above both eyebrows on his lower forehead and extending into bilateral temporal areas with mild tenderness on palpation as well as warmth.  There are no associated lesions seen or drainage.  Patient has had no new exposures.  He denies use of new chemicals, soaps, shampoos, detergents.  Patient denies increased shortness of breath or dysphagia.  He has no history of environmental allergies.  CT head was completed with no evidence of abnormality.  Abdominal pelvis CT was  completed with no acute abnormal findings.  We have admitted him to the hospitalist service for continued management  PAST MEDICAL HISTORY:   Past Medical History:  Diagnosis Date   Anginal pain (HCC)    Coronary artery disease    Diabetes mellitus without complication (HCC)    Hypertension    Stroke [facility])     PAST SURGICAL HISTORY:   Past Surgical History:  Procedure Laterality Date   CARDIAC CATHETERIZATION Left 08/09/2016   Procedure: Left Heart Cath and Coronary Angiography;  Surgeon: Dalia Heading, MD;  Location: [facility] INVASIVE CV LAB;  Service: Cardiovascular;  Laterality: Left;    SOCIAL HISTORY:   Social History   Tobacco Use   Smoking status: Never Smoker   Smokeless tobacco: Never Used  Substance Use Topics   Alcohol use: No    FAMILY HISTORY:  History reviewed. No pertinent family history.  DRUG ALLERGIES:  No Known Allergies  REVIEW OF SYSTEMS:   ROS  Constitutional: Positive for fever.  HENT: Negative for congestion, ear pain, sinus pain, sore throat and tinnitus.   Eyes: Negative for blurred vision, double vision, photophobia, pain, discharge and redness.  Respiratory: Negative for cough, hemoptysis, sputum production, shortness of breath, wheezing and stridor.   Cardiovascular: Negative for chest pain, palpitations, orthopnea, leg swelling and PND.  Gastrointestinal: Positive for nausea and vomiting. Negative for abdominal pain, blood in stool, constipation and diarrhea.  Genitourinary: Negative for dysuria, flank pain, frequency, hematuria  and urgency.  Musculoskeletal: Positive for falls Larey Seat on 5/20 /20). Negative for back pain, joint pain, myalgias and neck pain.  Skin: Positive for rash (facial). Negative for itching.  Neurological: Negative for dizziness, tingling, speech change, focal weakness, seizures, loss of consciousness, weakness and headaches.  Psychiatric/Behavioral: Negative.  Negative for depression.    MEDICATIONS AT  HOME:   Prior to Admission medications   Medication Sig Start Date End Date Taking? Authorizing Provider  aspirin EC 81 MG tablet Take 81 mg by mouth daily.   Yes [provider]  atorvastatin (LIPITOR) 80 MG tablet Take 80 mg by mouth daily. 02/04/19  Yes [provider]  carvedilol (COREG) 3.125 MG tablet Take 3.125 mg by mouth 2 (two) times daily with a meal.   Yes [provider]  empagliflozin (JARDIANCE) 25 MG TABS tablet Take 25 mg by mouth daily. 12/27/18  Yes [provider]  enalapril (VASOTEC) 10 MG tablet Take 10 mg by mouth every other day.   Yes [provider]  furosemide (LASIX) 80 MG tablet Take 80 mg by mouth 2 (two) times a day. 08/25/16  Yes [provider]  hydrochlorothiazide (HYDRODIURIL) 12.5 MG tablet Take 12.5 mg by mouth daily. 02/20/19  Yes [provider]  insulin NPH-regular Human (NOVOLIN 70/30) (70-30) 100 UNIT/ML injection Inject 24 Units into the skin daily with breakfast.   Yes [provider]  insulin NPH-regular Human (NOVOLIN 70/30) (70-30) 100 UNIT/ML injection Inject 20 Units into the skin daily with supper.   Yes [provider]  metFORMIN (GLUCOPHAGE) 1000 MG tablet Take 1,000 mg by mouth 2 (two) times daily with a meal.   Yes [provider]  acetaminophen (TYLENOL) 325 MG tablet Take 650 mg by mouth every 4 (four) hours as needed for pain.    [provider]  ibuprofen (ADVIL,MOTRIN) 200 MG tablet Take 200 mg by mouth every 6 (six) hours as needed.    [provider]      VITAL SIGNS:  Blood pressure (!) 132/56, pulse 86, temperature 99.6 F (37.6 C), temperature source Oral, resp. rate 16, height 6' (1.829 m), weight 115.7 kg, SpO2 95 %.  PHYSICAL EXAMINATION:  Physical Exam GENERAL:  68 y.o.-year-old patient lying in the bed with no acute distress.  EYES: Pupils equal, round, reactive to light and accommodation. No scleral icterus. Extraocular  muscles intact.  HEENT: Head atraumatic, normocephalic. Oropharynx and nasopharynx clear.  NECK:  Supple, no jugular venous distention. No thyroid enlargement, no tenderness.  LUNGS: Normal breath sounds bilaterally, no wheezing, rales,rhonchi or crepitation. No use of accessory muscles of respiration.  CARDIOVASCULAR: Regular rate and rhythm, S1, S2 normal. No murmurs, rubs, or gallops.  ABDOMEN: Soft, nondistended, nontender. Bowel sounds present. No organomegaly or mass.  EXTREMITIES: No pedal edema, cyanosis, or clubbing.  NEUROLOGIC: Cranial nerves II through XII are intact. Muscle strength 5/5 in all extremities. Sensation intact. Gait not checked.  PSYCHIATRIC: The patient is alert and oriented x 3.  Normal affect and good eye contact. SKIN: Edema and erythema of his nose and lower forehead as well as bilateral temple areas        LABORATORY PANEL:   CBC Recent Labs  Lab 03/16/19 2221  WBC 14.7*  HGB 17.2*  HCT 50.7  PLT 154   ------------------------------------------------------------------------------------------------------------------  Chemistries  Recent Labs  Lab 03/16/19 2221  NA 130*  K 4.4  CL 93*  CO2 22  GLUCOSE 276*  BUN 33*  CREATININE 1.17  CALCIUM 9.2  AST 22  ALT 34  ALKPHOS 86  BILITOT 2.3*   ------------------------------------------------------------------------------------------------------------------  Cardiac Enzymes Recent Labs  Lab 03/16/19 2221  TROPONINI 0.03*   ------------------------------------------------------------------------------------------------------------------  RADIOLOGY:  Ct Head Wo Contrast  Result Date: 03/17/2019 CLINICAL DATA:  Fall with vomiting EXAM: CT HEAD WITHOUT CONTRAST TECHNIQUE: Contiguous axial images were obtained from the base of the skull through the vertex without intravenous contrast. COMPARISON:  None. FINDINGS: Brain: No evidence of acute infarction, hemorrhage, hydrocephalus,  extra-axial collection or mass lesion/mass effect. Age related volume loss is again noted. Vascular: No hyperdense vessel or unexpected calcification. Skull: Normal. Negative for fracture or focal lesion. Sinuses/Orbits: There is mucosal thickening of the ethmoid air cells bilaterally. The remaining paranasal sinuses and mastoid air cells are essentially clear. Other: None. IMPRESSION: 1. No acute intracranial abnormality detected. 2. Chronic microvascular ischemic changes with age related volume loss. Electronically Signed   By: Katherine Mantle M.D.   On: 03/17/2019 02:10   Ct Abdomen Pelvis W Contrast  Result Date: 03/17/2019 CLINICAL DATA:  68 y/o  M; vomiting and occasional fevers. EXAM: CT ABDOMEN AND PELVIS WITH CONTRAST TECHNIQUE: Multidetector CT imaging of the abdomen and pelvis was performed using the standard protocol following bolus administration of intravenous contrast. CONTRAST:  ISOVUE-370 IOPAMIDOL (ISOVUE-370) INJECTION 76% COMPARISON:  None. FINDINGS: Lower chest: No acute abnormality. Hepatobiliary: No focal liver abnormality is seen. No gallstones, gallbladder wall thickening, or biliary dilatation. Pancreas: Unremarkable. No pancreatic ductal dilatation or surrounding inflammatory changes. Spleen: Normal in size without focal abnormality. Adrenals/Urinary Tract: Adrenal glands are unremarkable. Left kidney lower pole nonobstructing 4 mm stone. Otherwise kidneys are normal, without additional renal calculi, focal lesion, or hydronephrosis. Bladder is unremarkable. Stomach/Bowel: Stomach is within normal limits. Appendix appears normal. No evidence of bowel wall thickening, distention, or inflammatory changes. Vascular/Lymphatic: Aortic atherosclerosis. No enlarged abdominal or pelvic lymph nodes. Reproductive: Prostate is unremarkable. Other: No abdominal wall hernia or abnormality. No abdominopelvic ascites. Musculoskeletal: No acute or significant osseous findings. Thoracolumbar  spondylosis with prominent lower lumbar disc and facet degenerative changes. IMPRESSION: 1. No acute process identified. 2. Left kidney lower pole nonobstructing 4 mm stone. 3. Aortic Atherosclerosis (ICD10-I70.0). Electronically Signed   By: Mitzi Hansen M.D.   On: 03/17/2019 02:06   Dg Chest Port 1 View  Result Date: 03/16/2019 CLINICAL DATA:  Nausea and vomiting EXAM: PORTABLE CHEST 1 VIEW COMPARISON:  01/25/2008 FINDINGS: Cardiac shadow is stable. Postsurgical changes are now seen. The lungs are clear bilaterally. No bony abnormality is noted. IMPRESSION: No acute abnormality seen. Electronically Signed   By: Alcide Clever M.D.   On: 03/16/2019 22:54      IMPRESSION AND PLAN:   1. facial cellulitis - Patient received IV clindamycin while in the emergency room.  This has been continued by the hospitalist service.  Blood cultures are pending.  Will adjust therapy based on results - We will continue to monitor closely for changes in cellulitic areas - We will treat fever with Tylenol and nausea vomiting with IV antiemetic - We will treat pain if needed with analgesic  2.  Leukocytosis - Likely secondary to #1 -  IV antibiotic therapy initiated with clindamycin - We will repeat CBC and BMP in the a.m. - Await blood culture results  3. diabetes mellitus with hyperglycemia - Moderate sliding scale insulin - Hemoglobin A1c pending  4.  Dehydration - May be secondary to cellulitis - Patient received 1 L normal saline bolus in the  emergency room.  He has normal saline infusing through peripheral IV at 100 cc/h.  Will monitor hydration closely as patient has a history of hypertension, stroke, and dilated cardiomyopathy. - Telemetry monitoring - Repeat CBC and BMP in the a.m. and continue to monitor sodium level closely with mild hyponatremia (130 ) on arrival.    5.  Generalized weakness secondary to dehydration and infectious process - We will consult physical therapy for  supportive care   DVT and PPI prophylactic therapy initiated  All the records are reviewed and case discussed with ED provider. The plan of care was discussed in details with the patient (and family). I answered all questions. The patient agreed to proceed with the above mentioned plan. Further management will depend upon hospital course.   CODE STATUS: Full code  TOTAL TIME TAKING CARE OF THIS PATIENT: 45 minutes.    Alexander [name] CRNP on 03/17/2019 at 5:29 AM  Pager - [phone]  After 6pm go to [email or web address] - Social research officer, government  [facility]  Office  [phone]  CC: Primary care physician; Myrene Buddy, NP   Note: This dictation was prepared with Dragon dictation along with smaller phrase technology. Any transcriptional errors that result from this process are unintentional.

## 2019-03-17 NOTE — ED Notes (Addendum)
CRITICAL LAB: LACTIC is 2.0, [employer], Dr. Alphonzo Lemmings notified, orders received

## 2019-03-17 NOTE — Discharge Instructions (Addendum)

## 2019-03-17 NOTE — ED Notes (Addendum)
ED TO INPATIENT HANDOFF REPORT  ED Nurse Name and Phone #: Danelle Earthly [phone]  S Name/Age/Gender Alexander [name] 68 y.o. male Room/Bed: ED35A/ED35A  Code Status   Code Status: Prior  Home/SNF/Other Home Patient oriented to: self, place, time and situation Is this baseline? Yes   Triage Complete: Triage complete  Chief Complaint nvd  Triage Note Patient presents to Emergency Department via [facility] from home with complaints of  N/V/D//fever/poor appetite and fatigue since last Thursday. Pt reports falling on Wednesday without injury.     Pt reports/denies taking insulin and tylenol this morning.   Speaking in complete coherent sentences. No acute breathing distress noted.  Pt appears with red mark on forehead.  Mask on.   History of DM     Allergies No Known Allergies  Level of Care/Admitting Diagnosis ED Disposition    ED Disposition Condition Comment   Admit  Hospital Area: [facility] [[id number]]  Level of Care: Med-Surg [16]  Covid Evaluation: N/A  Diagnosis: Facial cellulitis [[id number]]  Admitting Physician: Willadean Carol DODD [[id number]]  Attending Physician: Willadean Carol DODD [[id number]]  Estimated length of stay: past midnight tomorrow  Certification:: I certify this patient will need inpatient services for at least 2 midnights  PT Class (Do Not Modify): Inpatient [101]  PT Acc Code (Do Not Modify): Private [1]       B Medical/Surgery History Past Medical History:  Diagnosis Date  . Anginal pain (HCC)   . Coronary artery disease   . Diabetes mellitus without complication (HCC)   . Hypertension   . Stroke [facility])    Past Surgical History:  Procedure Laterality Date  . CARDIAC CATHETERIZATION Left 08/09/2016   Procedure: Left Heart Cath and Coronary Angiography;  Surgeon: Dalia Heading, MD;  Location: [facility] INVASIVE CV LAB;  Service: Cardiovascular;  Laterality: Left;     A IV Location/Drains/Wounds Patient Lines/Drains/Airways Status    Active Line/Drains/Airways    Name:   Placement date:   Placement time:   Site:   Days:   Peripheral IV 03/16/19 Left Wrist   03/16/19    2148    Wrist   1   Peripheral IV 03/16/19 Right Antecubital   03/16/19    2215    Antecubital   1          Intake/Output Last 24 hours  Intake/Output Summary (Last 24 hours) at 03/17/2019 0514 Last data filed at 03/17/2019 0342 Gross per 24 hour  Intake 1050 ml  Output 500 ml  Net 550 ml    Labs/Imaging Results for orders placed or performed during the hospital encounter of 03/16/19 (from the past 48 hour(s))  Lipase, blood     Status: None   Collection Time: 03/16/19 10:21 PM  Result Value Ref Range   Lipase 23 11 - 51 U/L    Comment: Performed at [facility], [address] [place], [place] [address]  Comprehensive metabolic panel     Status: Abnormal   Collection Time: 03/16/19 10:21 PM  Result Value Ref Range   Sodium 130 (L) 135 - 145 mmol/L   Potassium 4.4 3.5 - 5.1 mmol/L   Chloride 93 (L) 98 - 111 mmol/L   CO2 22 22 - 32 mmol/L   Glucose, Bld 276 (H) 70 - 99 mg/dL   BUN 33 (H) 8 - 23 mg/dL   Creatinine, Ser 0.94 0.61 - 1.24 mg/dL   Calcium 9.2 8.9 - 07.6 mg/dL   Total Protein 8.6 (H)  6.5 - 8.1 g/dL   Albumin 4.2 3.5 - 5.0 g/dL   AST 22 15 - 41 U/L   ALT 34 0 - 44 U/L   Alkaline Phosphatase 86 38 - 126 U/L   Total Bilirubin 2.3 (H) 0.3 - 1.2 mg/dL   GFR calc non Af Amer >60 >60 mL/min   GFR calc Af Amer >60 >60 mL/min   Anion gap 15 5 - 15    Comment: Performed at [facility], [address] [place], [place] [address]  CBC     Status: Abnormal   Collection Time: 03/16/19 10:21 PM  Result Value Ref Range   WBC 14.7 (H) 4.0 - 10.5 K/uL   RBC 5.36 4.22 - 5.81 MIL/uL   Hemoglobin 17.2 (H) 13.0 - 17.0 g/dL   HCT 47.8 29.5 - 62.1 %   MCV 94.6 80.0 - 100.0 fL   MCH 32.1 26.0 - 34.0 pg   MCHC 33.9 30.0 - 36.0 g/dL   RDW 30.8 65.7 - 84.6 %   Platelets 154 150 - 400 K/uL   nRBC 0.0 0.0 - 0.2 %     Comment: Performed at [facility], [address] [place], [place] [address]  Lactic acid, plasma     Status: Abnormal   Collection Time: 03/16/19 10:21 PM  Result Value Ref Range   Lactic Acid, Venous 2.7 (HH) 0.5 - 1.9 mmol/L    Comment: CRITICAL RESULT CALLED TO, READ BACK BY AND VERIFIED WITH Bronsen Serano WEBSTER ON 03/16/19 AT [address] [facility] Performed at [facility], [address] [place], [place] [address]   SARS Coronavirus 2 (CEPHEID- Performed in [facility] hospital lab), Hosp Order     Status: None   Collection Time: 03/16/19 10:21 PM  Result Value Ref Range   SARS Coronavirus 2 NEGATIVE NEGATIVE    Comment: (NOTE) If result is NEGATIVE SARS-CoV-2 target nucleic acids are NOT DETECTED. The SARS-CoV-2 RNA is generally detectable in upper and lower  respiratory specimens during the acute phase of infection. The lowest  concentration of SARS-CoV-2 viral copies this assay can detect is 250  copies / mL. A negative result does not preclude SARS-CoV-2 infection  and should not be used as the sole basis for treatment or other  patient management decisions.  A negative result may occur with  improper specimen collection / handling, submission of specimen other  than nasopharyngeal swab, presence of viral mutation(s) within the  areas targeted by this assay, and inadequate number of viral copies  (<250 copies / mL). A negative result must be combined with clinical  observations, patient history, and epidemiological information. If result is POSITIVE SARS-CoV-2 target nucleic acids are DETECTED. The SARS-CoV-2 RNA is generally detectable in upper and lower  respiratory specimens dur ing the acute phase of infection.  Positive  results are indicative of active infection with SARS-CoV-2.  Clinical  correlation with patient history and other diagnostic information is  necessary to determine patient infection status.  Positive results do  not rule out bacterial infection or  co-infection with other viruses. If result is PRESUMPTIVE POSTIVE SARS-CoV-2 nucleic acids MAY BE PRESENT.   A presumptive positive result was obtained on the submitted specimen  and confirmed on repeat testing.  While 2019 novel coronavirus  (SARS-CoV-2) nucleic acids may be present in the submitted sample  additional confirmatory testing may be necessary for epidemiological  and / or clinical management purposes  to differentiate between  SARS-CoV-2 and other Sarbecovirus currently known  to infect humans.  If clinically indicated additional testing with an alternate test  methodology [phone] is advised. The SARS-CoV-2 RNA is generally  detectable in upper and lower respiratory sp ecimens during the acute  phase of infection. The expected result is Negative. Fact Sheet for Patients:  BoilerBrush.com.cyhttps://www.fda.gov/media/136312/download Fact Sheet for Healthcare Providers: https://pope.com/https://www.fda.gov/media/136313/download This test is not yet approved or cleared by the [place] FDA and has been authorized for detection and/or diagnosis of SARS-CoV-2 by FDA under an Emergency Use Authorization (EUA).  This EUA will remain in effect (meaning this test can be used) for the duration of the COVID-19 declaration under Section 564(b)(1) of the Act, 21 U.S.C. section 360bbb-3(b)(1), unless the authorization is terminated or revoked sooner. Performed at [facility], [address] [place], [place] [address]   Troponin I - Add-On to previous collection     Status: Abnormal   Collection Time: 03/16/19 10:21 PM  Result Value Ref Range   Troponin I 0.03 (HH) <0.03 ng/mL    Comment: CRITICAL RESULT CALLED TO, READ BACK BY AND VERIFIED WITH Oceane Fosse WEBSTER ON 03/16/19 AT [facility] [facility] Performed at [facility], [address] [place], [place] [address]   Urinalysis, Complete w Microscopic     Status: Abnormal   Collection Time: 03/17/19 12:24 AM  Result Value Ref Range   Color, Urine YELLOW  (A) YELLOW   APPearance CLEAR (A) CLEAR   Specific Gravity, Urine 1.029 1.005 - 1.030   pH 5.0 5.0 - 8.0   Glucose, UA >=500 (A) NEGATIVE mg/dL   Hgb urine dipstick MODERATE (A) NEGATIVE   Bilirubin Urine NEGATIVE NEGATIVE   Ketones, ur 80 (A) NEGATIVE mg/dL   Protein, ur 30 (A) NEGATIVE mg/dL   Nitrite NEGATIVE NEGATIVE   Leukocytes,Ua NEGATIVE NEGATIVE   RBC / HPF 0-5 0 - 5 RBC/hpf   WBC, UA 0-5 0 - 5 WBC/hpf   Bacteria, UA RARE (A) NONE SEEN   Squamous Epithelial / LPF NONE SEEN 0 - 5    Comment: Performed at [facility], [address] [place], [place] [address]  Lactic acid, plasma     Status: Abnormal   Collection Time: 03/17/19 12:24 AM  Result Value Ref Range   Lactic Acid, Venous 2.0 (HH) 0.5 - 1.9 mmol/L    Comment: CRITICAL RESULT CALLED TO, READ BACK BY AND VERIFIED WITH Geronimo Diliberto WEBSTER ON 03/17/19 AT [address] [facility] Performed at [facility], [address] [place], [place] [address]    Ct Head Wo Contrast  Result Date: 03/17/2019 CLINICAL DATA:  Fall with vomiting EXAM: CT HEAD WITHOUT CONTRAST TECHNIQUE: Contiguous axial images were obtained from the base of the skull through the vertex without intravenous contrast. COMPARISON:  None. FINDINGS: Brain: No evidence of acute infarction, hemorrhage, hydrocephalus, extra-axial collection or mass lesion/mass effect. Age related volume loss is again noted. Vascular: No hyperdense vessel or unexpected calcification. Skull: Normal. Negative for fracture or focal lesion. Sinuses/Orbits: There is mucosal thickening of the ethmoid air cells bilaterally. The remaining paranasal sinuses and mastoid air cells are essentially clear. Other: None. IMPRESSION: 1. No acute intracranial abnormality detected. 2. Chronic microvascular ischemic changes with age related volume loss. Electronically Signed   By: Katherine Mantlehristopher  Green M.D.   On: 03/17/2019 02:10   Ct Abdomen Pelvis W Contrast  Result Date: 03/17/2019 CLINICAL DATA:  68 y/o   M; vomiting and occasional fevers. EXAM: CT ABDOMEN AND PELVIS WITH CONTRAST TECHNIQUE: Multidetector CT imaging of the abdomen and  pelvis was performed using the standard protocol following bolus administration of intravenous contrast. CONTRAST:  ISOVUE-370 IOPAMIDOL (ISOVUE-370) INJECTION 76% COMPARISON:  None. FINDINGS: Lower chest: No acute abnormality. Hepatobiliary: No focal liver abnormality is seen. No gallstones, gallbladder wall thickening, or biliary dilatation. Pancreas: Unremarkable. No pancreatic ductal dilatation or surrounding inflammatory changes. Spleen: Normal in size without focal abnormality. Adrenals/Urinary Tract: Adrenal glands are unremarkable. Left kidney lower pole nonobstructing 4 mm stone. Otherwise kidneys are normal, without additional renal calculi, focal lesion, or hydronephrosis. Bladder is unremarkable. Stomach/Bowel: Stomach is within normal limits. Appendix appears normal. No evidence of bowel wall thickening, distention, or inflammatory changes. Vascular/Lymphatic: Aortic atherosclerosis. No enlarged abdominal or pelvic lymph nodes. Reproductive: Prostate is unremarkable. Other: No abdominal wall hernia or abnormality. No abdominopelvic ascites. Musculoskeletal: No acute or significant osseous findings. Thoracolumbar spondylosis with prominent lower lumbar disc and facet degenerative changes. IMPRESSION: 1. No acute process identified. 2. Left kidney lower pole nonobstructing 4 mm stone. 3. Aortic Atherosclerosis (ICD10-I70.0). Electronically Signed   By: Mitzi Hansen M.D.   On: 03/17/2019 02:06   Dg Chest Port 1 View  Result Date: 03/16/2019 CLINICAL DATA:  Nausea and vomiting EXAM: PORTABLE CHEST 1 VIEW COMPARISON:  01/25/2008 FINDINGS: Cardiac shadow is stable. Postsurgical changes are now seen. The lungs are clear bilaterally. No bony abnormality is noted. IMPRESSION: No acute abnormality seen. Electronically Signed   By: Alcide Clever M.D.   On:  03/16/2019 22:54    Pending Labs Unresulted Labs (From admission, onward)    Start     Ordered   03/17/19 0305  Gastrointestinal Panel by PCR , Stool  (Gastrointestinal Panel by PCR, Stool)  Once,   STAT     03/17/19 0304   03/17/19 0305  C Difficile Quick Screen w PCR reflex  (Gastrointestinal Panel by PCR, Stool)  Once, for 24 hours,   STAT     03/17/19 0304   03/16/19 2211  Blood culture (routine x 2)  BLOOD CULTURE X 2,   STAT     03/16/19 2210   Signed and Held  Hemoglobin A1c  Once,   R    Comments:  To assess prior glycemic control    Signed and Held   Signed and Held  HIV antibody (Routine Testing)  Once,   R     Signed and Held   Signed and Held  CBC  (enoxaparin (LOVENOX)    CrCl >/= 30 ml/min)  Once,   R    Comments:  Baseline for enoxaparin therapy IF NOT ALREADY DRAWN.  Notify MD if PLT < 100 K.    Signed and Held   Signed and Held  Creatinine, serum  (enoxaparin (LOVENOX)    CrCl >/= 30 ml/min)  Once,   R    Comments:  Baseline for enoxaparin therapy IF NOT ALREADY DRAWN.    Signed and Held   Signed and Held  Creatinine, serum  (enoxaparin (LOVENOX)    CrCl >/= 30 ml/min)  Weekly,   R    Comments:  while on enoxaparin therapy    Signed and Held   Signed and Held  Basic metabolic panel  Tomorrow morning,   R     Signed and Held   Signed and Held  CBC  Tomorrow morning,   R     Signed and Held          Vitals/Pain Today's Vitals   03/17/19 0144 03/17/19 0444 03/17/19 0502 03/17/19 0510  BP: Marland Kitchen)  151/62   (!) 132/56  Pulse: 87   86  Resp: 16   16  Temp: 99.5 F (37.5 C)   99.3 F (37.4 C)  TempSrc: Oral   Oral  SpO2: 96%   95%  Weight:      Height:      PainSc: 0-No pain Asleep 0-No pain 0-No pain    Isolation Precautions Enteric precautions (UV disinfection)  Medications Medications  sodium chloride flush (NS) 0.9 % injection 3 mL (3 mLs Intravenous Not Given 03/16/19 2333)  ondansetron (ZOFRAN) injection 4 mg (has no administration in time  range)  sodium chloride 0.9 % bolus 1,000 mL (0 mLs Intravenous Stopped 03/17/19 0100)  acetaminophen (TYLENOL) tablet 1,000 mg (1,000 mg Oral Given 03/16/19 2330)  iohexol (OMNIPAQUE) 240 MG/ML injection 50 mL (50 mLs Oral Contrast Given 03/17/19 0037)  iopamidol (ISOVUE-370) 76 % injection 100 mL (100 mLs Intravenous Contrast Given 03/17/19 0136)  clindamycin (CLEOCIN) IVPB 600 mg (0 mg Intravenous Stopped 03/17/19 0342)    Mobility walks Low fall risk   Focused Assessments Cardiac Assessment Handoff:  Cardiac Rhythm: Normal sinus rhythm Lab Results  Component Value Date   TROPONINI 0.03 (HH) 03/16/2019   No results found for: DDIMER Does the Patient currently have chest pain? No     R Recommendations: See Admitting Provider Note  Report given to:   Additional Notes:    Pt has used his cell phone to keep his wife updated

## 2019-03-17 NOTE — Evaluation (Signed)
Physical Therapy Evaluation Patient Details Name: Alexander [name] MRN: [id number] DOB: 01-31-1951 Today's Date: 03/17/2019   History of Present Illness   68 y.o. male with a known history of coronary artery disease, diabetes mellitus, hypertension, stroke.  He presented to the emergency room complaining of fever chills with nausea, vomiting, and mild diarrhea over the last 3 days now with increasing weakness and malaise.  Patient reports having fallen on 03/13/2019 on an outdoor wooden deck.     Clinical Impression  Pt alert, oriented, slightly impulsive with mobility, and unaware/unaccepting of deficits in balance/gait. Pt provided PLOF, stated he lives in a two story home with at least 20 steps to get in, wife available 24/7, works full time Statistician), independent. Does endorse 2 falls in the last 6 months.  Pt demonstrated bed mobility minA (handheld assist to scoot laterally in bed). Stated he had some dizziness sitting EOB that resolved quickly. Good sitting balance, was able to don shoes. Of note, pt unable to clear toes from ground, little to no dorsiflexion bilaterally, pt stated they have been like that for a very long time. Sit <> stand x2 this session without AD, CGA. First attempt pt lost his balance posteriorly, able to correct with use of UE and assistance from PT, PT instructed pt to return to sitting EOB. Second attempt pt able to maintain standing, CGA. When ambulating towards door, pt constantly reaching for UE bilaterally, improved with IV pole. Pt ambulated ~130ft with IV pole and CGA. Pt presented with steppage gait bilaterally due to bilateraly foot drop. Also exhibited narrow base of support, mild-mod gait path deviations, and mild LOB; able to correct with UE support against walls/ IV. Improvement noted in balance with time.  Overall the patient demonstrated deficits (see "PT Problem List") that impede the patient's functional abilities, safety, and mobility and would benefit from  skilled PT intervention. Pt educated about potential benefit of AFOs to address foot drop, pt resistant due to these restricting driving abilities. Recommendation HHPT with supervision for mobility/OOB for safety concerns.       Follow Up Recommendations Home health PT;Supervision for mobility/OOB    Equipment Recommendations  Other (comment)(SPC though pt insistent he does not need an AD)    Recommendations for Other Services       Precautions / Restrictions Precautions Precautions: Fall Restrictions Weight Bearing Restrictions: No      Mobility  Bed Mobility Overal bed mobility: Needs Assistance Bed Mobility: Supine to Sit     Supine to sit: Min assist     General bed mobility comments: PT utilized handheld assist to scoot laterally towards side of bed.  Transfers Overall transfer level: Needs assistance Equipment used: None Transfers: Sit to/from Stand Sit to Stand: Min guard         General transfer comment: first attempt pt loss balance posteriorly, able to correct with use of bed rail, PT instructed pt to sit, rest. Second attempt CGA pt reaching for outside support, no LOB noted.   Ambulation/Gait Ambulation/Gait assistance: Min guard Gait Distance (Feet): 180 Feet Assistive device: IV Pole       General Gait Details: Pt presented with steppage gait bilaterally due to bilateraly foot drop. Also exhibited narrow base of support, mild-mod gait path deviations, and mild LOB; able to correct with UE support against walls/ IV. Improvement noted in balance with time.   Stairs            Wheelchair Mobility    Modified Rankin (Stroke  Patients Only)       Balance Overall balance assessment: Needs assistance Sitting-balance support: Feet supported Sitting balance-Leahy Scale: Fair       Standing balance-Leahy Scale: Poor Standing balance comment: Pt needed at least unilateral UE support during mobility though pt not receptive to deficits                              Pertinent Vitals/Pain Pain Assessment: No/denies pain    Home Living Family/patient expects to be discharged to:: Private residence Living Arrangements: Spouse/significant other Available Help at Discharge: Family;Available 24 hours/day Type of Home: House Home Access: Stairs to enter Entrance Stairs-Rails: Left Entrance Stairs-Number of Steps: 20 Home Layout: Two level Home Equipment: None Additional Comments: endorses 2 falls in the last 6 months 1 precipitated this admission    Prior Function Level of Independence: Independent         Comments: Pt stated that he was previously independent, works full time, Administrator, [employer]   Dominant Hand: Right    Extremity/Trunk Assessment   Upper Extremity Assessment Upper Extremity Assessment: Overall WFL for tasks assessed    Lower Extremity Assessment Lower Extremity Assessment: (Pt unable to dorsiflexion bilaterally. 4/5 leg strength otherwise)    Cervical / Trunk Assessment Cervical / Trunk Assessment: Normal  Communication   Communication: No difficulties  Cognition Arousal/Alertness: Awake/alert Behavior During Therapy: WFL for tasks assessed/performed Overall Cognitive Status: Within Functional Limits for tasks assessed                                        General Comments      Exercises     Assessment/Plan    PT Assessment Patient needs continued PT services  PT Problem List Decreased strength;Decreased mobility;Decreased safety awareness;Decreased range of motion;Decreased activity tolerance;Decreased balance;Decreased knowledge of use of DME       PT Treatment Interventions DME instruction;Functional mobility training;Patient/family education;Balance training;Gait training;Therapeutic activities;Neuromuscular re-education;Stair training;Therapeutic exercise    PT Goals (Current goals can be found in the Care Plan section)  Acute Rehab PT  Goals Patient Stated Goal: to get his strength back PT Goal Formulation: With patient Time For Goal Achievement: 03/31/19 Potential to Achieve Goals: Good    Frequency Min 2X/week   Barriers to discharge        Co-evaluation               AM-PAC PT "6 Clicks" Mobility  Outcome Measure Help needed turning from your back to your side while in a flat bed without using bedrails?: A Little Help needed moving from lying on your back to sitting on the side of a flat bed without using bedrails?: A Little Help needed moving to and from a bed to a chair (including a wheelchair)?: A Little Help needed standing up from a chair using your arms (e.g., wheelchair or bedside chair)?: A Little Help needed to walk in hospital room?: A Little Help needed climbing 3-5 steps with a railing? : A Lot 6 Click Score: 17    End of Session Equipment Utilized During Treatment: Gait belt Activity Tolerance: Patient tolerated treatment well Patient left: with call bell/phone within reach;with chair alarm set;in chair Nurse Communication: Mobility status PT Visit Diagnosis: Unsteadiness on feet (R26.81);Difficulty in walking, not elsewhere classified (R26.2);History of falling (Z91.81)    Time:  7628-3151 PT Time Calculation (min) (ACUTE ONLY): 26 min   Charges:   PT Evaluation $PT Eval Low Complexity: 1 Low PT Treatments $Therapeutic Exercise: 8-22 mins      Olga Coaster PT, DPT 3:39 PM,03/17/19 [phone]

## 2019-03-17 NOTE — Progress Notes (Signed)
Family Meeting Note  Advance Directive:no  Today a meeting took place with the Patient.  Patient is able to participate.   The following clinical team members were present during this meeting:MD  The following were discussed:Patient's diagnosis: sepsis, Patient's progosis: Unable to determine and Goals for treatment: Full Code  Additional follow-up to be provided: prn  Time spent during discussion:20 minutes  Trishna Cwik D Francely Craw, MD  

## 2019-03-17 NOTE — Plan of Care (Signed)
  Problem: Clinical Measurements: Goal: Ability to avoid or minimize complications of infection will improve Outcome: Progressing   Problem: Skin Integrity: Goal: Skin integrity will improve Outcome: Progressing   Problem: Education: Goal: Knowledge of General Education information will improve Description Including pain rating scale, medication(s)/side effects and non-pharmacologic comfort measures Outcome: Progressing   Problem: Health Behavior/Discharge Planning: Goal: Ability to manage health-related needs will improve Outcome: Progressing   Problem: Clinical Measurements: Goal: Ability to maintain clinical measurements within normal limits will improve Outcome: Progressing Goal: Will remain free from infection Outcome: Progressing Goal: Diagnostic test results will improve Outcome: Progressing Goal: Respiratory complications will improve Outcome: Progressing Goal: Cardiovascular complication will be avoided Outcome: Progressing   Problem: Nutrition: Goal: Adequate nutrition will be maintained Outcome: Progressing   Problem: Safety: Goal: Ability to remain free from injury will improve Outcome: Progressing   Problem: Skin Integrity: Goal: Risk for impaired skin integrity will decrease Outcome: Progressing

## 2019-03-18 LAB — LACTIC ACID, PLASMA: Lactic Acid, Venous: 1.2 mmol/L (ref 0.5–1.9)

## 2019-03-18 LAB — GLUCOSE, CAPILLARY: Glucose-Capillary: 180 mg/dL — ABNORMAL HIGH (ref 70–99)

## 2019-03-18 NOTE — Plan of Care (Signed)
  Problem: Clinical Measurements: Goal: Ability to avoid or minimize complications of infection will improve Outcome: Completed/Met   Problem: Skin Integrity: Goal: Skin integrity will improve Outcome: Completed/Met   Problem: Education: Goal: Knowledge of General Education information will improve Description Including pain rating scale, medication(s)/side effects and non-pharmacologic comfort measures Outcome: Completed/Met   Problem: Health Behavior/Discharge Planning: Goal: Ability to manage health-related needs will improve Outcome: Completed/Met   Problem: Clinical Measurements: Goal: Ability to maintain clinical measurements within normal limits will improve Outcome: Completed/Met Goal: Will remain free from infection Outcome: Completed/Met Goal: Diagnostic test results will improve Outcome: Completed/Met Goal: Respiratory complications will improve Outcome: Completed/Met Goal: Cardiovascular complication will be avoided Outcome: Completed/Met   Problem: Nutrition: Goal: Adequate nutrition will be maintained Outcome: Completed/Met   Problem: Safety: Goal: Ability to remain free from injury will improve Outcome: Completed/Met   Problem: Skin Integrity: Goal: Risk for impaired skin integrity will decrease Outcome: Completed/Met

## 2019-03-18 NOTE — Discharge Summary (Signed)
[facility] at [facility]   PATIENT NAME: Alexander [name]    MR#:  [id number]  DATE OF BIRTH:  [birth year]  DATE OF ADMISSION:  03/16/2019 ADMITTING PHYSICIAN: Campbell Stall, MD  DATE OF DISCHARGE: 03/17/2018  PRIMARY CARE PHYSICIAN: Myrene Buddy, NP    ADMISSION DIAGNOSIS:  Dehydration [E86.0] Periorbital cellulitis, unspecified laterality [L03.213]  DISCHARGE DIAGNOSIS:   Acute gastroenteritis--suspected viral Facial cellulitis--improved SECONDARY DIAGNOSIS:   Past Medical History:  Diagnosis Date  . Anginal pain (HCC)   . Coronary artery disease   . Diabetes mellitus without complication (HCC)   . Hypertension   . Stroke [facility])     HOSPITAL COURSE:  Alexander [name]  is a 68 y.o. male with a known history of coronary artery disease, diabetes mellitus, hypertension, stroke.  He presented to the emergency room complaining of fever chills with nausea, vomiting, and mild diarrhea over the last 3 days now with increasing weakness and malaise  1. facial cellulitis - Patient received IV clindamycin while in the emergency room. --change to oral clindamycin -  Blood cultures are negative in 2 days.  Will adjust therapy based on results - much improved redness -no fever and wbc better -pt overall feels better  2.  Leukocytosis - Likely secondary to #1 -  IV antibiotic therapy initiated with clindamycin   3. diabetes mellitus with hyperglycemia - Moderate sliding scale insulin - Hemoglobin A1c pending  4. Acute gastroenteritis and mild Dehydration - May be secondary to GI losses - -appears well hydrated now -symptoms resolved -lactic acid much improved.  5.  Generalized weakness secondary to dehydration and infectious process -reviewed physical therapy note for bilateral foot drop -discussed with patient. He has had it for many years. He does not want any workup done. He'll follow-up with primary care physician if  needed.  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 Patient does not want physical therapy be arranged at home.   Overall feels at baseline. Will discharge him to home. Patient improved remarkably more quickly than expected.  CONSULTS OBTAINED:    DRUG ALLERGIES:  No Known Allergies  DISCHARGE MEDICATIONS:   Allergies as of 03/18/2019   No Known Allergies     Medication List    STOP taking these medications   ibuprofen 200 MG tablet Commonly known as:  ADVIL     TAKE these medications   acetaminophen 325 MG tablet Commonly known as:  TYLENOL Take 650 mg by mouth every 4 (four) hours as needed for pain.   aspirin EC 81 MG tablet Take 81 mg by mouth daily.   atorvastatin 80 MG tablet Commonly known as:  LIPITOR Take 80 mg by mouth daily.   carvedilol 3.125 MG tablet Commonly known as:  COREG Take 3.125 mg by mouth 2 (two) times daily with a meal.   clindamycin 300 MG capsule Commonly known as:  CLEOCIN Take 2 capsules (600 mg total) by mouth 3 (three) times daily for 7 days.   enalapril 10 MG tablet Commonly known as:  VASOTEC Take 10 mg by mouth every other day.   furosemide 80 MG tablet Commonly known as:  LASIX Take 80 mg by mouth 2 (two) times a day.   hydrochlorothiazide 12.5 MG tablet Commonly known as:  HYDRODIURIL Take 12.5 mg by mouth daily.   insulin NPH-regular Human (70-30) 100 UNIT/ML injection Inject 24 Units into the skin daily with breakfast.   insulin NPH-regular Human (70-30) 100 UNIT/ML  injection Inject 20 Units into the skin daily with supper.   Jardiance 25 MG Tabs tablet Generic drug:  empagliflozin Take 25 mg by mouth daily.   metFORMIN 1000 MG tablet Commonly known as:  GLUCOPHAGE Take 1,000 mg by mouth 2 (two) times daily with a meal.       If you experience worsening of your admission symptoms, develop shortness of breath, life threatening emergency, suicidal or homicidal thoughts you must seek  medical attention immediately by calling 911 or calling your MD immediately  if symptoms less severe.  You Must read complete instructions/literature along with all the possible adverse reactions/side effects for all the Medicines you take and that have been prescribed to you. Take any new Medicines after you have completely understood and accept all the possible adverse reactions/side effects.   Please note  You were cared for by a hospitalist during your hospital stay. If you have any questions about your discharge medications or the care you received while you were in the hospital after you are discharged, you can call the unit and asked to speak with the hospitalist on call if the hospitalist that took care of you is not available. Once you are discharged, your primary care physician will handle any further medical issues. Please note that NO REFILLS for any discharge medications will be authorized once you are discharged, as it is imperative that you return to your primary care physician (or establish a relationship with a primary care physician if you do not have one) for your aftercare needs so that they can reassess your need for medications and monitor your lab values. Today   SUBJECTIVE   Doing well good breakfast. No nausea vomiting or diarrhea.  redness on the face improved remarkably  VITAL SIGNS:  Blood pressure (!) 113/59, pulse 62, temperature (!) 97.3 F (36.3 C), temperature source Oral, resp. rate 14, height 6' (1.829 m), weight 112.7 kg, SpO2 97 %.  I/O:    Intake/Output Summary (Last 24 hours) at 03/18/2019 0944 Last data filed at 03/18/2019 0334 Gross per 24 hour  Intake 2383.92 ml  Output 450 ml  Net 1933.92 ml    PHYSICAL EXAMINATION:  GENERAL:  68 y.o.-year-old patient lying in the bed with no acute distress. Obese EYES: Pupils equal, round, reactive to light and accommodation. No scleral icterus. Extraocular muscles intact.  HEENT: Head atraumatic,  normocephalic. Oropharynx and nasopharynx clear. Facial cellulitis over the forehead and nose improving NECK:  Supple, no jugular venous distention. No thyroid enlargement, no tenderness.  LUNGS: Normal breath sounds bilaterally, no wheezing, rales,rhonchi or crepitation. No use of accessory muscles of respiration.  CARDIOVASCULAR: S1, S2 normal. No murmurs, rubs, or gallops.  ABDOMEN: Soft, non-tender, non-distended. Bowel sounds present. No organomegaly or mass.  EXTREMITIES: No pedal edema, cyanosis, or clubbing.  NEUROLOGIC: Cranial nerves II through XII are intact. Muscle strength 5/5 in all extremities. Sensation intact. Gait not checked.  PSYCHIATRIC: The patient is alert and oriented x 3.  SKIN: No obvious rash, lesion, or ulcer.   DATA REVIEW:   CBC  Recent Labs  Lab 03/17/19 0712  WBC 13.7*  HGB 15.8  HCT 47.0  PLT 141*    Chemistries  Recent Labs  Lab 03/16/19 2221  03/17/19 1306  NA 130*   < > 132*  K 4.4   < > 3.9  CL 93*   < > 97*  CO2 22   < > 22  GLUCOSE 276*   < > 192*  BUN 33*   < > 33*  CREATININE 1.17   < > 1.10  CALCIUM 9.2   < > 8.1*  AST 22  --   --   ALT 34  --   --   ALKPHOS 86  --   --   BILITOT 2.3*  --   --    < > = values in this interval not displayed.    Microbiology Results   Recent Results (from the past 240 hour(s))  Blood culture (routine x 2)     Status: None (Preliminary result)   Collection Time: 03/16/19 10:21 PM  Result Value Ref Range Status   Specimen Description BLOOD LEFT ANTECUBITAL  Final   Special Requests   Final    BOTTLES DRAWN AEROBIC AND ANAEROBIC Blood Culture adequate volume   Culture   Final    NO GROWTH 2 DAYS Performed at [facility], [address] [place], [place] [address]    Report Status PENDING  Incomplete  Blood culture (routine x 2)     Status: None (Preliminary result)   Collection Time: 03/16/19 10:21 PM  Result Value Ref Range Status   Specimen Description BLOOD LEFT WRIST  Final    Special Requests   Final    BOTTLES DRAWN AEROBIC AND ANAEROBIC Blood Culture results may not be optimal due to an excessive volume of blood received in culture bottles   Culture   Final    NO GROWTH 2 DAYS Performed at [facility], [address], [place], [place] [address]    Report Status PENDING  Incomplete  SARS Coronavirus 2 (CEPHEID- Performed in [facility] hospital lab), Hosp Order     Status: None   Collection Time: 03/16/19 10:21 PM  Result Value Ref Range Status   SARS Coronavirus 2 NEGATIVE NEGATIVE Final    Comment: (NOTE) If result is NEGATIVE SARS-CoV-2 target nucleic acids are NOT DETECTED. The SARS-CoV-2 RNA is generally detectable in upper and lower  respiratory specimens during the acute phase of infection. The lowest  concentration of SARS-CoV-2 viral copies this assay can detect is 250  copies / mL. A negative result does not preclude SARS-CoV-2 infection  and should not be used as the sole basis for treatment or other  patient management decisions.  A negative result may occur with  improper specimen collection / handling, submission of specimen other  than nasopharyngeal swab, presence of viral mutation(s) within the  areas targeted by this assay, and inadequate number of viral copies  (<250 copies / mL). A negative result must be combined with clinical  observations, patient history, and epidemiological information. If result is POSITIVE SARS-CoV-2 target nucleic acids are DETECTED. The SARS-CoV-2 RNA is generally detectable in upper and lower  respiratory specimens dur ing the acute phase of infection.  Positive  results are indicative of active infection with SARS-CoV-2.  Clinical  correlation with patient history and other diagnostic information is  necessary to determine patient infection status.  Positive results do  not rule out bacterial infection or co-infection with other viruses. If result is PRESUMPTIVE POSTIVE SARS-CoV-2 nucleic  acids MAY BE PRESENT.   A presumptive positive result was obtained on the submitted specimen  and confirmed on repeat testing.  While 2019 novel coronavirus  (SARS-CoV-2) nucleic acids may be present in the submitted sample  additional confirmatory testing may be necessary for epidemiological  and / or clinical management purposes  to differentiate between  SARS-CoV-2 and other Sarbecovirus currently  known to infect humans.  If clinically indicated additional testing with an alternate test  methodology [phone]) is advised. The SARS-CoV-2 RNA is generally  detectable in upper and lower respiratory sp ecimens during the acute  phase of infection. The expected result is Negative. Fact Sheet for Patients:  BoilerBrush.com.cy Fact Sheet for Healthcare Providers: https://pope.com/ This test is not yet approved or cleared by the [place] FDA and has been authorized for detection and/or diagnosis of SARS-CoV-2 by FDA under an Emergency Use Authorization (EUA).  This EUA will remain in effect (meaning this test can be used) for the duration of the COVID-19 declaration under Section 564(b)(1) of the Act, 21 U.S.C. section 360bbb-3(b)(1), unless the authorization is terminated or revoked sooner. Performed at [facility], [address] [place], [place] [address]     RADIOLOGY:  Ct Head Wo Contrast  Result Date: 03/17/2019 CLINICAL DATA:  Fall with vomiting EXAM: CT HEAD WITHOUT CONTRAST TECHNIQUE: Contiguous axial images were obtained from the base of the skull through the vertex without intravenous contrast. COMPARISON:  None. FINDINGS: Brain: No evidence of acute infarction, hemorrhage, hydrocephalus, extra-axial collection or mass lesion/mass effect. Age related volume loss is again noted. Vascular: No hyperdense vessel or unexpected calcification. Skull: Normal. Negative for fracture or focal lesion. Sinuses/Orbits: There is  mucosal thickening of the ethmoid air cells bilaterally. The remaining paranasal sinuses and mastoid air cells are essentially clear. Other: None. IMPRESSION: 1. No acute intracranial abnormality detected. 2. Chronic microvascular ischemic changes with age related volume loss. Electronically Signed   By: Katherine Mantle M.D.   On: 03/17/2019 02:10   Ct Abdomen Pelvis W Contrast  Result Date: 03/17/2019 CLINICAL DATA:  68 y/o  M; vomiting and occasional fevers. EXAM: CT ABDOMEN AND PELVIS WITH CONTRAST TECHNIQUE: Multidetector CT imaging of the abdomen and pelvis was performed using the standard protocol following bolus administration of intravenous contrast. CONTRAST:  ISOVUE-370 IOPAMIDOL (ISOVUE-370) INJECTION 76% COMPARISON:  None. FINDINGS: Lower chest: No acute abnormality. Hepatobiliary: No focal liver abnormality is seen. No gallstones, gallbladder wall thickening, or biliary dilatation. Pancreas: Unremarkable. No pancreatic ductal dilatation or surrounding inflammatory changes. Spleen: Normal in size without focal abnormality. Adrenals/Urinary Tract: Adrenal glands are unremarkable. Left kidney lower pole nonobstructing 4 mm stone. Otherwise kidneys are normal, without additional renal calculi, focal lesion, or hydronephrosis. Bladder is unremarkable. Stomach/Bowel: Stomach is within normal limits. Appendix appears normal. No evidence of bowel wall thickening, distention, or inflammatory changes. Vascular/Lymphatic: Aortic atherosclerosis. No enlarged abdominal or pelvic lymph nodes. Reproductive: Prostate is unremarkable. Other: No abdominal wall hernia or abnormality. No abdominopelvic ascites. Musculoskeletal: No acute or significant osseous findings. Thoracolumbar spondylosis with prominent lower lumbar disc and facet degenerative changes. IMPRESSION: 1. No acute process identified. 2. Left kidney lower pole nonobstructing 4 mm stone. 3. Aortic Atherosclerosis (ICD10-I70.0). Electronically  Signed   By: Mitzi Hansen M.D.   On: 03/17/2019 02:06   Dg Chest Port 1 View  Result Date: 03/16/2019 CLINICAL DATA:  Nausea and vomiting EXAM: PORTABLE CHEST 1 VIEW COMPARISON:  01/25/2008 FINDINGS: Cardiac shadow is stable. Postsurgical changes are now seen. The lungs are clear bilaterally. No bony abnormality is noted. IMPRESSION: No acute abnormality seen. Electronically Signed   By: Alcide Clever M.D.   On: 03/16/2019 22:54     CODE STATUS:     Code Status Orders  (From admission, onward)         Start     Ordered   03/17/19 0602  Full code  Continuous     03/17/19 0601        Code Status History    Date Active Date Inactive Code Status Order ID Comments User Context   08/09/2016 1358 08/09/2016 1903 Full Code [id number]  Dalia Heading, MD Inpatient      TOTAL TIME TAKING CARE OF THIS PATIENT: *40* minutes.    Enedina Finner M.D on 03/18/2019 at 9:44 AM  Between 7am to 6pm - Pager - [phone] After 6pm go to [email or web address] - Social research officer, government  [facility]  Office  [phone]  CC: Primary care physician; Bayard Males Hermenia Fiscal, NP

## 2019-03-18 NOTE — Plan of Care (Signed)
  Problem: Clinical Measurements: Goal: Ability to avoid or minimize complications of infection will improve Outcome: Progressing   Problem: Skin Integrity: Goal: Skin integrity will improve Outcome: Progressing   Problem: Education: Goal: Knowledge of General Education information will improve Description Including pain rating scale, medication(s)/side effects and non-pharmacologic comfort measures Outcome: Progressing   Problem: Safety: Goal: Ability to remain free from injury will improve Outcome: Progressing   Problem: Skin Integrity: Goal: Risk for impaired skin integrity will decrease Outcome: Progressing

## 2019-03-20 LAB — HIV ANTIBODY (ROUTINE TESTING W REFLEX): HIV Screen 4th Generation wRfx: NONREACTIVE

## 2019-03-21 LAB — CULTURE, BLOOD (ROUTINE X 2)
Culture: NO GROWTH
Culture: NO GROWTH
Special Requests: ADEQUATE

## 2020-08-17 DIAGNOSIS — Z8673 Personal history of transient ischemic attack (TIA), and cerebral infarction without residual deficits: Secondary | ICD-10-CM | POA: Insufficient documentation

## 2021-01-06 IMAGING — CT CT HEAD WITHOUT CONTRAST
3 series · 16 of 47 positions shown, 19 images · non-contrast
Comparison: None.

CLINICAL DATA: Fall with vomiting

EXAM:
CT HEAD WITHOUT CONTRAST
TECHNIQUE: Contiguous axial images were obtained from the base of the skull
through the vertex without intravenous contrast.

[Series 2: head wo · axial · 0.46mm/px · z∈[-126,+4]mm · 10 of 32 slices shown, 13 images]
[im 3/32  brain]
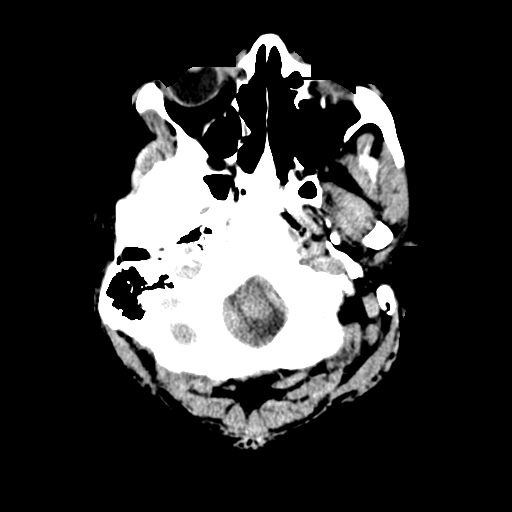
[im 3/32  bone]
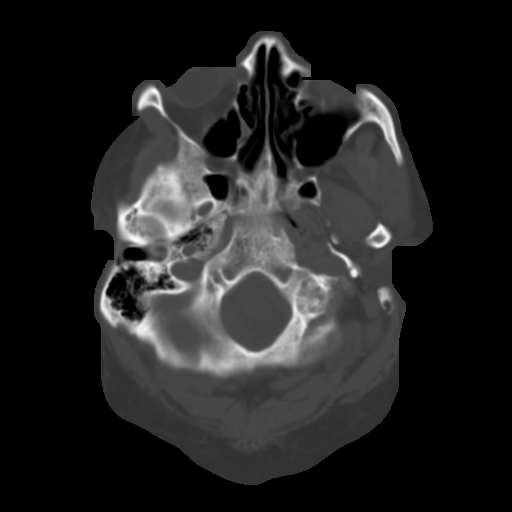
[im 6/32  brain]
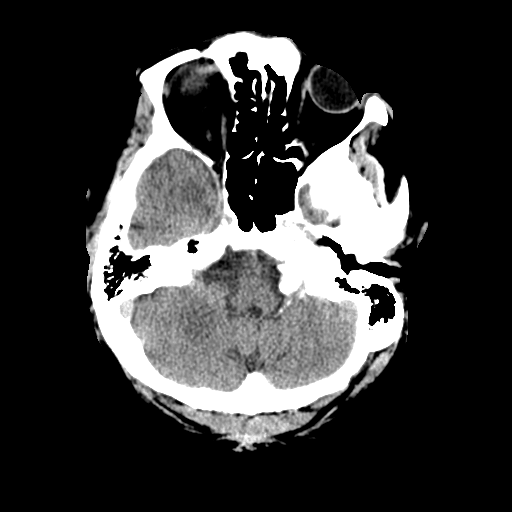
[im 9/32  brain]
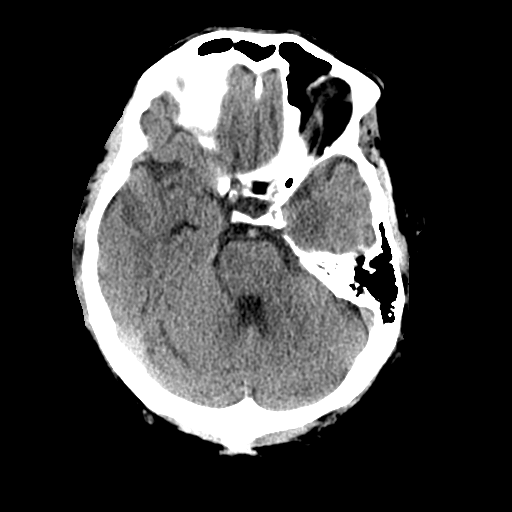
[im 11/32  brain]
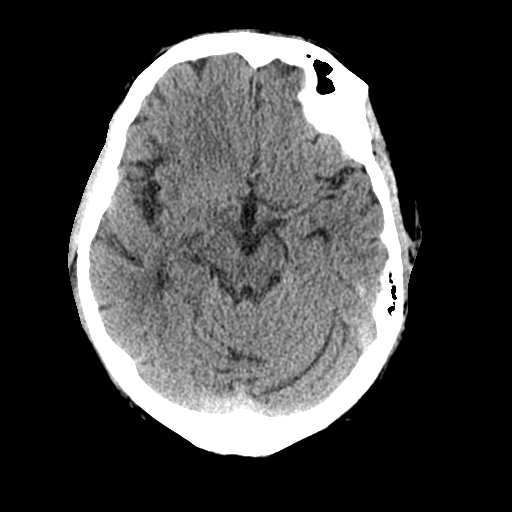
[im 14/32  brain]
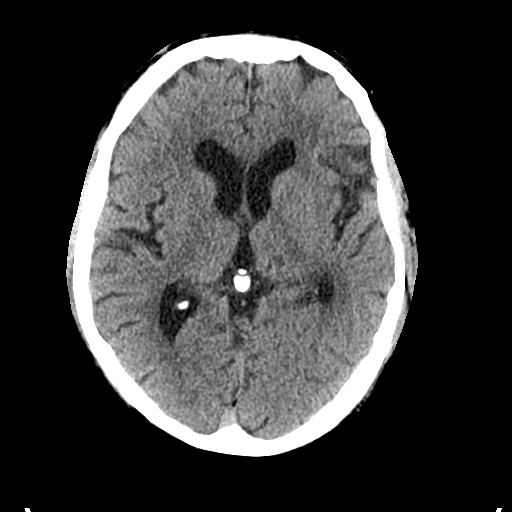
[im 14/32  bone]
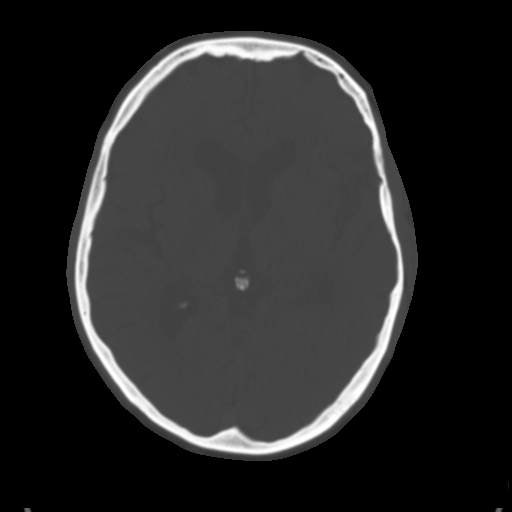
[im 18/32  brain]
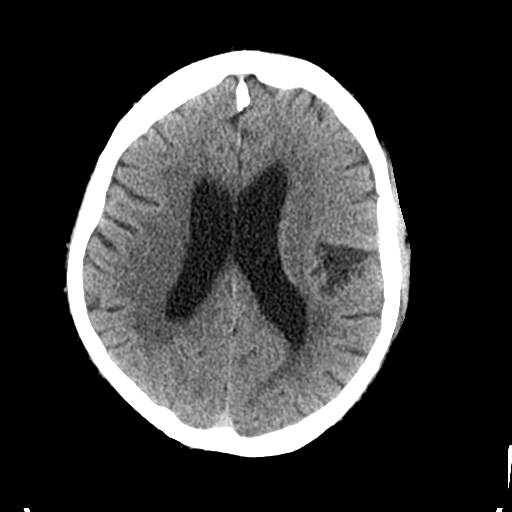
[im 21/32  brain]
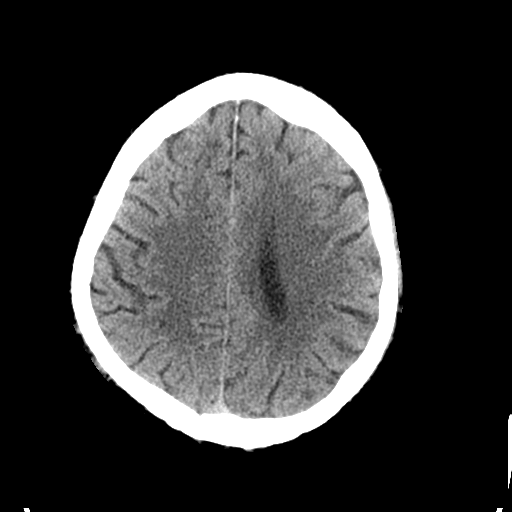
[im 24/32  brain]
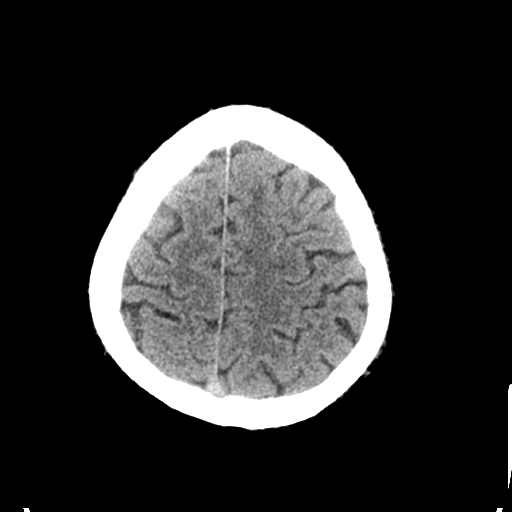
[im 26/32  brain]
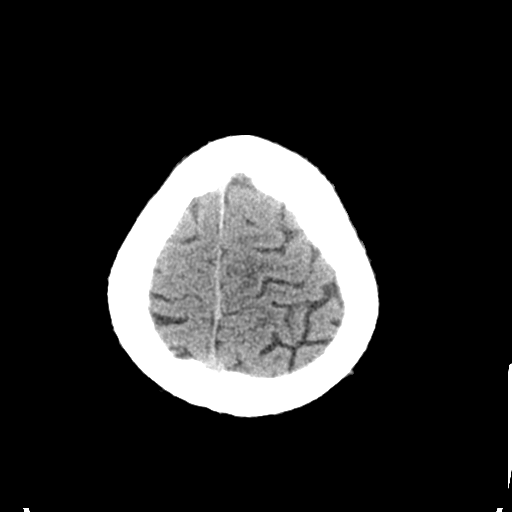
[im 26/32  bone]
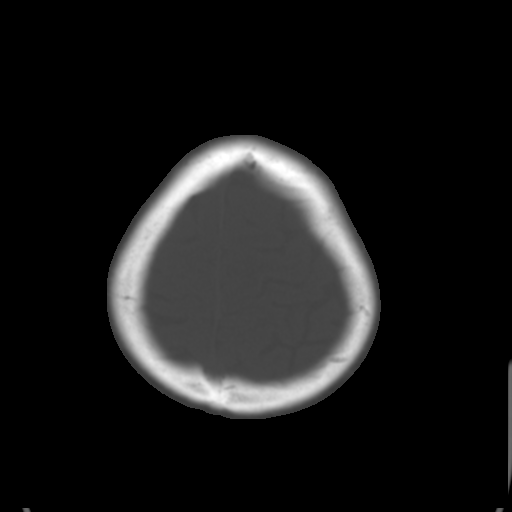
[im 29/32  brain]
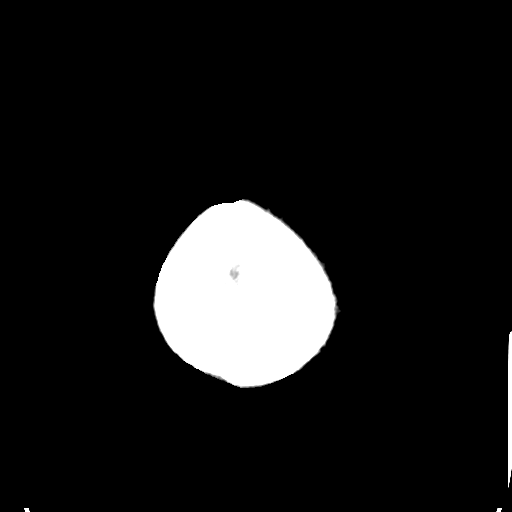

[Series 4: coronal soft tissue · coronal · 0.33mm/px · 3 of 66 slices shown]
[im 22/66  brain]
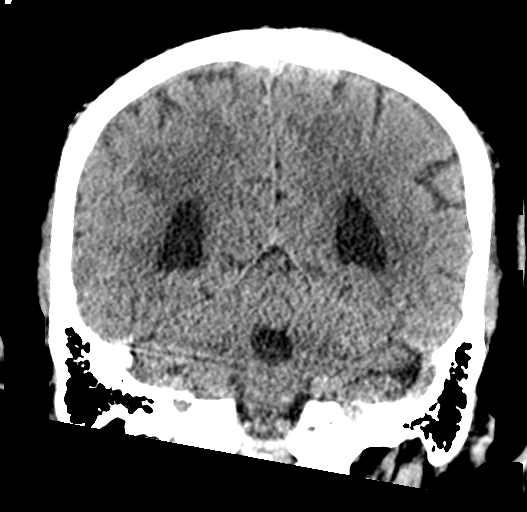
[im 29/66  brain]
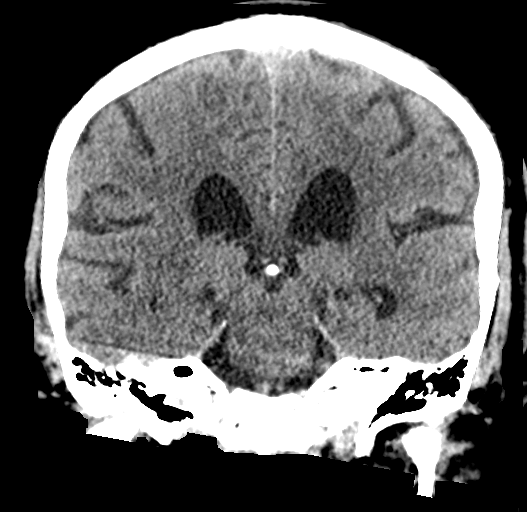
[im 37/66  brain]
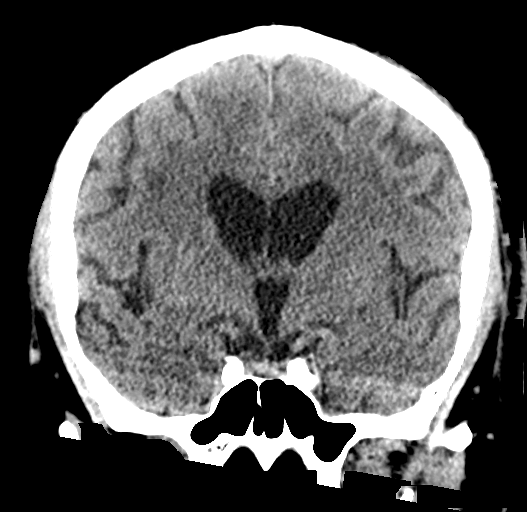

[Series 5: sagittal soft tissue · sagittal · 0.33mm/px · 3 of 58 slices shown]
[im 23/58  brain]
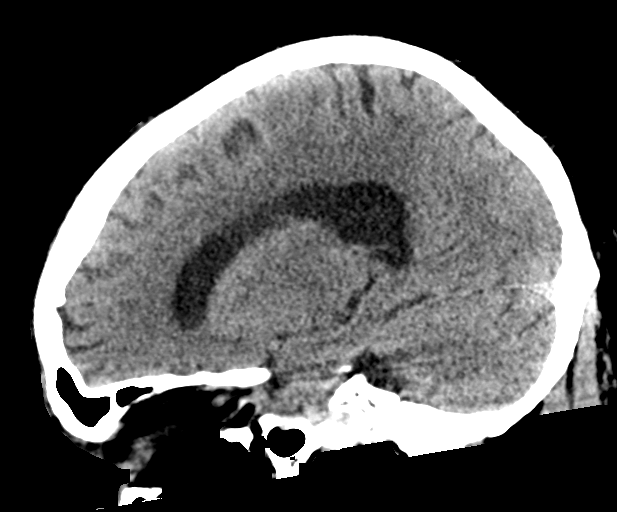
[im 29/58  brain]
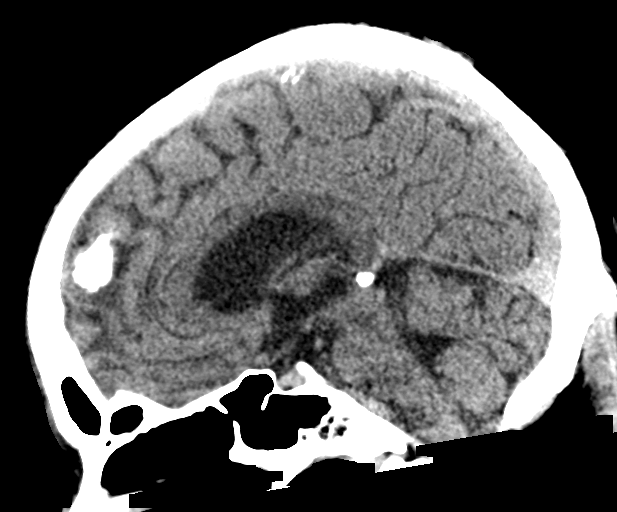
[im 36/58  brain]
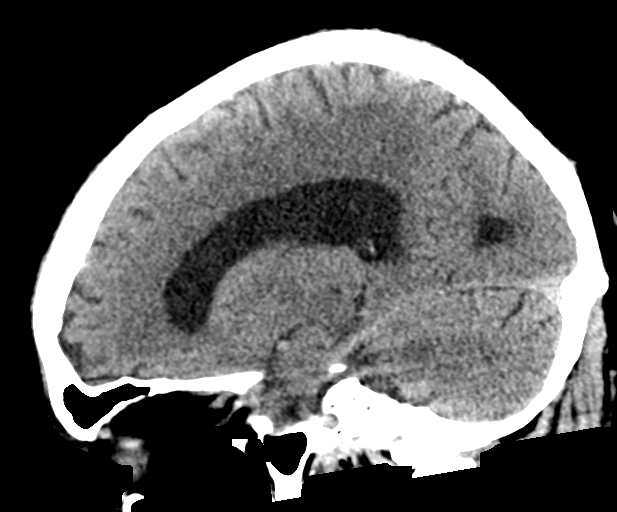

[16 of 47 positions shown; findings below may reference images not displayed]

FINDINGS: Brain: No evidence of acute infarction, hemorrhage, hydrocephalus,
extra-axial collection or mass lesion/mass effect. Age related
volume loss is again noted.

Vascular: No hyperdense vessel or unexpected calcification.

Skull: Normal. Negative for fracture or focal lesion.

Sinuses/Orbits: There is mucosal thickening of the ethmoid air cells
bilaterally. The remaining paranasal sinuses and mastoid air cells
are essentially clear.

Other: None.
IMPRESSION: 1. No acute intracranial abnormality detected.
2. Chronic microvascular ischemic changes with age related volume
loss.

## 2021-01-29 ENCOUNTER — Other Ambulatory Visit: Admission: RE | Admit: 2021-01-29 | Payer: Medicare Other | Source: Ambulatory Visit

## 2021-02-01 ENCOUNTER — Encounter: Payer: Self-pay | Admitting: *Deleted

## 2021-02-02 ENCOUNTER — Encounter: Payer: Self-pay | Admitting: *Deleted

## 2021-02-02 ENCOUNTER — Ambulatory Visit: Payer: Medicare Other | Admitting: Anesthesiology

## 2021-02-02 ENCOUNTER — Ambulatory Visit
Admission: RE | Admit: 2021-02-02 | Discharge: 2021-02-02 | Disposition: A | Discharge status: Home / Self Care | Payer: Medicare Other | Attending: Gastroenterology | Admitting: Gastroenterology

## 2021-02-02 ENCOUNTER — Other Ambulatory Visit: Payer: Self-pay

## 2021-02-02 ENCOUNTER — Encounter
Admission: RE | Disposition: A | Discharge status: Home / Self Care | Payer: Self-pay | Source: Home / Self Care | Attending: Gastroenterology

## 2021-02-02 DIAGNOSIS — E119 Type 2 diabetes mellitus without complications: Secondary | ICD-10-CM | POA: Insufficient documentation

## 2021-02-02 DIAGNOSIS — Z1211 Encounter for screening for malignant neoplasm of colon: Secondary | ICD-10-CM | POA: Diagnosis not present

## 2021-02-02 DIAGNOSIS — Z5309 Procedure and treatment not carried out because of other contraindication: Secondary | ICD-10-CM | POA: Diagnosis not present

## 2021-02-02 DIAGNOSIS — Z7982 Long term (current) use of aspirin: Secondary | ICD-10-CM | POA: Insufficient documentation

## 2021-02-02 DIAGNOSIS — I5022 Chronic systolic (congestive) heart failure: Secondary | ICD-10-CM | POA: Insufficient documentation

## 2021-02-02 DIAGNOSIS — Z794 Long term (current) use of insulin: Secondary | ICD-10-CM | POA: Diagnosis not present

## 2021-02-02 DIAGNOSIS — E785 Hyperlipidemia, unspecified: Secondary | ICD-10-CM | POA: Diagnosis not present

## 2021-02-02 DIAGNOSIS — Z8673 Personal history of transient ischemic attack (TIA), and cerebral infarction without residual deficits: Secondary | ICD-10-CM | POA: Diagnosis not present

## 2021-02-02 DIAGNOSIS — I110 Hypertensive heart disease with heart failure: Secondary | ICD-10-CM | POA: Diagnosis not present

## 2021-02-02 DIAGNOSIS — Z79899 Other long term (current) drug therapy: Secondary | ICD-10-CM | POA: Insufficient documentation

## 2021-02-02 HISTORY — PX: COLONOSCOPY WITH PROPOFOL: SHX5780

## 2021-02-02 HISTORY — DX: Personal history of urinary calculi: Z87.442

## 2021-02-02 LAB — GLUCOSE, CAPILLARY: Glucose-Capillary: 166 mg/dL — ABNORMAL HIGH (ref 70–99)

## 2021-02-02 SURGERY — COLONOSCOPY WITH PROPOFOL
Anesthesia: General

## 2021-02-02 MED ORDER — SODIUM CHLORIDE 0.9 % IV SOLN: INTRAVENOUS | Status: DC

## 2021-02-02 MED ORDER — PROPOFOL 10 MG/ML IV BOLUS
INTRAVENOUS | Status: DC | PRN
  Administered 2021-02-02: 50 mg via INTRAVENOUS

## 2021-02-02 MED ORDER — PROPOFOL 500 MG/50ML IV EMUL
INTRAVENOUS | Status: DC | PRN
  Administered 2021-02-02: 125 ug/kg/min via INTRAVENOUS

## 2021-02-02 NOTE — Interval H&P Note (Signed)
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  The various methods of treatment have been discussed with the patient and family. After consideration of risks, benefits and other options for treatment, the patient has consented to  Procedure(s): COLONOSCOPY WITH PROPOFOL (N/A) as a surgical intervention.  The patient's history has been reviewed, patient examined, no change in status, stable for surgery.  I have reviewed the patient's chart and labs.  Questions were answered to the patient's satisfaction.     Regis Bill  Ok to proceed with colonoscopy

## 2021-02-02 NOTE — Transfer of Care (Addendum)
Immediate Anesthesia Transfer of Care Note  Patient: Alexander [name]  Procedure(s) Performed: COLONOSCOPY WITH PROPOFOL (N/A )  Patient Location: PACU  Anesthesia Type:General  Level of Consciousness: sedated  Airway & Oxygen Therapy: Patient Spontanous Breathing and Patient connected to nasal cannula oxygen  Post-op Assessment: Report given to RN and Post -op Vital signs reviewed and stable  Post vital signs: Reviewed and stable  Last Vitals:  Vitals Value Taken Time  BP 101/63 02/02/21 0903  Temp 35.9 C 02/02/21 0901  Pulse 66 02/02/21 0904  Resp 26 02/02/21 0904  SpO2 95 % 02/02/21 0904  Vitals shown include unvalidated device data.  Last Pain:  Vitals:   02/02/21 0901  TempSrc: Temporal  PainSc:          Complications: No complications documented.

## 2021-02-02 NOTE — Anesthesia Postprocedure Evaluation (Signed)
Anesthesia Post Note  Patient: Alexander [name]  Procedure(s) Performed: COLONOSCOPY WITH PROPOFOL (N/A )  Patient location during evaluation: Endoscopy Anesthesia Type: General Level of consciousness: awake and alert Pain management: pain level controlled Vital Signs Assessment: post-procedure vital signs reviewed and stable Respiratory status: spontaneous breathing, nonlabored ventilation, respiratory function stable and patient connected to nasal cannula oxygen Cardiovascular status: blood pressure returned to baseline and stable Postop Assessment: no apparent nausea or vomiting Anesthetic complications: no   No complications documented.   Last Vitals:  Vitals:   02/02/21 0815 02/02/21 0901  BP: 138/87 101/63  Pulse: 78 67  Resp: 18 16  Temp: (!) 36.4 C (!) 35.9 C  SpO2: 98% 96%    Last Pain:  Vitals:   02/02/21 0921  TempSrc:   PainSc: 0-No pain                 Lenard Simmer

## 2021-02-02 NOTE — Progress Notes (Signed)
Aborted procedure due to poor prep °

## 2021-02-02 NOTE — Anesthesia Preprocedure Evaluation (Signed)
Anesthesia Evaluation  Patient identified by MRN, date of birth, ID band Patient awake    Reviewed: Allergy & Precautions, H&P , NPO status , Patient's Chart, lab work & pertinent test results, reviewed documented beta blocker date and time   History of Anesthesia Complications Negative for: history of anesthetic complications  Airway Mallampati: IV  TM Distance: >3 FB Neck ROM: full    Dental no notable dental hx. (+) Dental Advidsory Given, Missing, Teeth Intact   Pulmonary neg pulmonary ROS,    Pulmonary exam normal breath sounds clear to auscultation       Cardiovascular Exercise Tolerance: Good hypertension, (-) angina+ CAD and + CABG  (-) Past MI Normal cardiovascular exam(-) dysrhythmias (-) Valvular Problems/Murmurs Rhythm:regular Rate:Normal     Neuro/Psych neg Seizures TIAnegative psych ROS   GI/Hepatic negative GI ROS, Neg liver ROS,   Endo/Other  diabetes  Renal/GU negative Renal ROS  negative genitourinary   Musculoskeletal   Abdominal   Peds  Hematology negative hematology ROS (+)   Anesthesia Other Findings Past Medical History: No date: Anginal pain (HCC) No date: Coronary artery disease No date: Diabetes mellitus without complication (HCC) No date: History of kidney stones No date: Hypertension No date: Stroke (HCC)   Reproductive/Obstetrics negative OB ROS                             Anesthesia Physical Anesthesia Plan  ASA: III  Anesthesia Plan: General   Post-op Pain Management:    Induction: Intravenous  PONV Risk Score and Plan: 2 and TIVA and Propofol infusion  Airway Management Planned: Natural Airway and Nasal Cannula  Additional Equipment:   Intra-op Plan:   Post-operative Plan:   Informed Consent: I have reviewed the patients History and Physical, chart, labs and discussed the procedure including the risks, benefits and alternatives for the  proposed anesthesia with the patient or authorized representative who has indicated his/her understanding and acceptance.     Dental Advisory Given  Plan Discussed with: Anesthesiologist, CRNA and Surgeon  Anesthesia Plan Comments:         Anesthesia Quick Evaluation

## 2021-02-02 NOTE — Op Note (Signed)
[facility]enter[facility] Patient Name: Alexander [name] Procedure Date: 02/02/2021 8:09 AM MRN: [id number] Account #: [id number] Date of Birth: January 19, 1951 Admit Type: Outpatient Age: 70 Room: [facility] ENDO ROOM 1 Gender: Male Note Status: Finalized Procedure:             Colonoscopy Indications:           Screening for colorectal malignant neoplasm Providers:             Eather Colas MD, MD Medicines:             Monitored Anesthesia Care Complications:         No immediate complications. Procedure:             Pre-Anesthesia Assessment:                        - Prior to the procedure, a History and Physical was                         performed, and patient medications and allergies were                         reviewed. The patient is competent. The risks and                         benefits of the procedure and the sedation options and                         risks were discussed with the patient. All questions                         were answered and informed consent was obtained.                         Patient identification and proposed procedure were                         verified by the physician, the nurse, the anesthetist                         and the technician in the endoscopy suite. Mental                         Status Examination: alert and oriented. Airway                         Examination: normal oropharyngeal airway and neck                         mobility. Respiratory Examination: clear to                         auscultation. CV Examination: normal. Prophylactic                         Antibiotics: The patient does not require prophylactic                         antibiotics. Prior Anticoagulants: The patient has  taken no previous anticoagulant or antiplatelet                         agents. ASA Grade Assessment: III - A patient with                         severe systemic disease. After reviewing the risks and                          benefits, the patient was deemed in satisfactory                         condition to undergo the procedure. The anesthesia                         plan was to use monitored anesthesia care (MAC).                         Immediately prior to administration of medications,                         the patient was re-assessed for adequacy to receive                         sedatives. The heart rate, respiratory rate, oxygen                         saturations, blood pressure, adequacy of pulmonary                         ventilation, and response to care were monitored                         throughout the procedure. The physical status of the                         patient was re-assessed after the procedure.                        After obtaining informed consent, the colonoscope was                         passed under direct vision. Throughout the procedure,                         the patient's blood pressure, pulse, and oxygen                         saturations were monitored continuously. The                         Colonoscope was introduced through the anus with the                         intention of advancing to the cecum. The scope was                         advanced to the sigmoid colon before the procedure  was                         aborted. Medications were given. The colonoscopy was                         aborted due to poor bowel prep with stool present. Findings:      The perianal and digital rectal examinations were normal.      A large amount of semi-solid stool was found in the recto-sigmoid colon,       precluding visualization. Impression:            - The procedure was aborted due to poor bowel prep                         with stool present.                        - Stool in the recto-sigmoid colon.                        - No specimens collected. Recommendation:        - Discharge patient to home.                        - Resume previous  diet.                        - Continue present medications.                        - Repeat colonoscopy at the next available appointment                         for screening purposes.                        - Return to referring physician as previously                         scheduled. Procedure Code(s):     --- Professional ---                        X9147, 53, Colorectal cancer screening; colonoscopy on                         individual not meeting criteria for high risk Diagnosis Code(s):     --- Professional ---                        Z12.11, Encounter for screening for malignant neoplasm                         of colon CPT copyright 2019 [employer]. All rights reserved. The codes documented in this report are preliminary and upon coder review may  be revised to meet current compliance requirements. Eather Colas MD, MD 02/02/2021 8:55:43 AM Number of Addenda: 0 Note Initiated On: 02/02/2021 8:09 AM Total Procedure Duration: 0 hours 2 minutes 0 seconds  Estimated Blood Loss:  Estimated blood loss: none.      [facility]

## 2021-02-02 NOTE — H&P (Signed)
Outpatient short stay form Pre-procedure 02/02/2021 8:18 AM Merlyn Lot MD, MPH  Primary Physician: NP Gauger  Reason for visit:  Screening Colonoscopy  History of present illness:   70 y/o gentleman with history of HFrEF, DM II, and HLD here for screening colonoscopy. No family history of GI malignancies. No blood thinners except aspirin. No abdominal surgeries.    Current Facility-Administered Medications:  .  0.9 %  sodium chloride infusion, , Intravenous, Continuous, Tim Corriher, Rossie Muskrat, MD  Medications Prior to Admission  Medication Sig Dispense Refill Last Dose  . aspirin EC 81 MG tablet Take 81 mg by mouth daily.   Past Week at Unknown time  . atorvastatin (LIPITOR) 80 MG tablet Take 80 mg by mouth daily.   Past Week at Unknown time  . carvedilol (COREG) 3.125 MG tablet Take 3.125 mg by mouth 2 (two) times daily with a meal.   Past Week at Unknown time  . empagliflozin (JARDIANCE) 25 MG TABS tablet Take 25 mg by mouth daily.   Past Week at Unknown time  . enalapril (VASOTEC) 10 MG tablet Take 10 mg by mouth every other day.   Past Week at Unknown time  . furosemide (LASIX) 80 MG tablet Take 80 mg by mouth 2 (two) times a day.   Past Week at Unknown time  . hydrochlorothiazide (HYDRODIURIL) 12.5 MG tablet Take 12.5 mg by mouth daily.   Past Week at Unknown time  . insulin NPH-regular Human (NOVOLIN 70/30) (70-30) 100 UNIT/ML injection Inject 28 Units into the skin daily with breakfast. 24 units in the evening with dinner   02/01/2021 at Unknown time  . Semaglutide (OZEMPIC, 1 MG/DOSE, [place]) Inject 0.75 mLs into the skin once a week.     Marland Kitchen acetaminophen (TYLENOL) 325 MG tablet Take 650 mg by mouth every 4 (four) hours as needed for pain.        No Known Allergies   Past Medical History:  Diagnosis Date  . Anginal pain (HCC)   . Coronary artery disease   . Diabetes mellitus without complication (HCC)   . History of kidney stones   . Hypertension   . Stroke [facility])      Review of systems:  Otherwise negative.    Physical Exam  Gen: Alert, oriented. Appears stated age.  HEENT:PERRLA. Lungs: No respiratory distress CV: RRR Abd: soft, benign, no masses Ext: No edema    Planned procedures: Proceed with colonoscopy. The patient understands the nature of the planned procedure, indications, risks, alternatives and potential complications including but not limited to bleeding, infection, perforation, damage to internal organs and possible oversedation/side effects from anesthesia. The patient agrees and gives consent to proceed.  Please refer to procedure notes for findings, recommendations and patient disposition/instructions.     Merlyn Lot MD, MPH Gastroenterology 02/02/2021  8:18 AM

## 2021-02-03 ENCOUNTER — Encounter: Payer: Self-pay | Admitting: Gastroenterology

## 2021-07-10 ENCOUNTER — Other Ambulatory Visit: Payer: Self-pay

## 2021-07-10 ENCOUNTER — Emergency Department: Payer: Medicare Other

## 2021-07-10 ENCOUNTER — Emergency Department
Admission: EM | Admit: 2021-07-10 | Discharge: 2021-07-11 | Disposition: A | Discharge status: Home / Self Care | Payer: Medicare Other | Attending: Emergency Medicine | Admitting: Emergency Medicine

## 2021-07-10 DIAGNOSIS — E119 Type 2 diabetes mellitus without complications: Secondary | ICD-10-CM | POA: Diagnosis not present

## 2021-07-10 DIAGNOSIS — M25561 Pain in right knee: Secondary | ICD-10-CM | POA: Insufficient documentation

## 2021-07-10 DIAGNOSIS — Z79899 Other long term (current) drug therapy: Secondary | ICD-10-CM | POA: Insufficient documentation

## 2021-07-10 DIAGNOSIS — I10 Essential (primary) hypertension: Secondary | ICD-10-CM | POA: Insufficient documentation

## 2021-07-10 DIAGNOSIS — I2510 Atherosclerotic heart disease of native coronary artery without angina pectoris: Secondary | ICD-10-CM | POA: Insufficient documentation

## 2021-07-10 DIAGNOSIS — S82001A Unspecified fracture of right patella, initial encounter for closed fracture: Secondary | ICD-10-CM

## 2021-07-10 DIAGNOSIS — Y990 Civilian activity done for income or pay: Secondary | ICD-10-CM | POA: Insufficient documentation

## 2021-07-10 DIAGNOSIS — Z951 Presence of aortocoronary bypass graft: Secondary | ICD-10-CM | POA: Insufficient documentation

## 2021-07-10 DIAGNOSIS — W19XXXA Unspecified fall, initial encounter: Secondary | ICD-10-CM

## 2021-07-10 DIAGNOSIS — S8991XA Unspecified injury of right lower leg, initial encounter: Secondary | ICD-10-CM | POA: Diagnosis present

## 2021-07-10 DIAGNOSIS — S82044A Nondisplaced comminuted fracture of right patella, initial encounter for closed fracture: Secondary | ICD-10-CM | POA: Diagnosis not present

## 2021-07-10 DIAGNOSIS — Z7982 Long term (current) use of aspirin: Secondary | ICD-10-CM | POA: Insufficient documentation

## 2021-07-10 DIAGNOSIS — Y92007 Garden or yard of unspecified non-institutional (private) residence as the place of occurrence of the external cause: Secondary | ICD-10-CM | POA: Insufficient documentation

## 2021-07-10 DIAGNOSIS — Z794 Long term (current) use of insulin: Secondary | ICD-10-CM | POA: Insufficient documentation

## 2021-07-10 DIAGNOSIS — W010XXA Fall on same level from slipping, tripping and stumbling without subsequent striking against object, initial encounter: Secondary | ICD-10-CM | POA: Diagnosis not present

## 2021-07-10 LAB — COMPREHENSIVE METABOLIC PANEL
ALT: 42 U/L (ref 0–44)
AST: 21 U/L (ref 15–41)
Albumin: 4 g/dL (ref 3.5–5.0)
Alkaline Phosphatase: 63 U/L (ref 38–126)
Anion gap: 12 (ref 5–15)
BUN: 26 mg/dL — ABNORMAL HIGH (ref 8–23)
CO2: 20 mmol/L — ABNORMAL LOW (ref 22–32)
Calcium: 9 mg/dL (ref 8.9–10.3)
Chloride: 103 mmol/L (ref 98–111)
Creatinine, Ser: 0.83 mg/dL (ref 0.61–1.24)
GFR, Estimated: >60 mL/min (ref >60)
Glucose, Bld: 163 mg/dL — ABNORMAL HIGH (ref 70–99)
Potassium: 3.7 mmol/L (ref 3.5–5.1)
Sodium: 135 mmol/L (ref 135–145)
Total Bilirubin: 1.5 mg/dL — ABNORMAL HIGH (ref 0.3–1.2)
Total Protein: 6.9 g/dL (ref 6.5–8.1)

## 2021-07-10 LAB — CBC WITH DIFFERENTIAL/PLATELET
Abs Immature Granulocytes: 0.04 10*3/uL (ref 0.00–0.07)
Basophils Absolute: 0 10*3/uL (ref 0.0–0.1)
Basophils Relative: 0 %
Eosinophils Absolute: 0.1 10*3/uL (ref 0.0–0.5)
Eosinophils Relative: 1 %
HCT: 47.4 % (ref 39.0–52.0)
Hemoglobin: 16.3 g/dL (ref 13.0–17.0)
Immature Granulocytes: 0 %
Lymphocytes Relative: 14 %
Lymphs Abs: 1.6 10*3/uL (ref 0.7–4.0)
MCH: 32.4 pg (ref 26.0–34.0)
MCHC: 34.4 g/dL (ref 30.0–36.0)
MCV: 94.2 fL (ref 80.0–100.0)
Monocytes Absolute: 1.3 10*3/uL — ABNORMAL HIGH (ref 0.1–1.0)
Monocytes Relative: 11 %
Neutro Abs: 8.6 10*3/uL — ABNORMAL HIGH (ref 1.7–7.7)
Neutrophils Relative %: 74 %
Platelets: 159 10*3/uL (ref 150–400)
RBC: 5.03 MIL/uL (ref 4.22–5.81)
RDW: 13.1 % (ref 11.5–15.5)
WBC: 11.6 10*3/uL — ABNORMAL HIGH (ref 4.0–10.5)
nRBC: 0 % (ref 0.0–0.2)

## 2021-07-10 NOTE — ED Notes (Signed)
ED Provider at bedside. 

## 2021-07-10 NOTE — ED Notes (Signed)
Patient reports fell while going to bathroom 4-5 days ago, reports couple hours later while he was at work and landed on his knees, states left knee took most of hit.  Reports over past 2 days his right knee has been hurting and swelling and today unable to bear weight.  Right knee is swollen, red and warm to touch.

## 2021-07-10 NOTE — ED Triage Notes (Signed)
Patient reports falls x2 this past week.  Reports now with pain, difficulty walking, swelling and heat to right knee.

## 2021-07-11 MED ORDER — OXYCODONE HCL 5 MG PO TABS
5.0000 mg | ORAL_TABLET | Freq: Once | ORAL | Status: AC
  Administered 2021-07-11: 5 mg via ORAL
  Filled 2021-07-11: qty 1

## 2021-07-11 MED ORDER — ACETAMINOPHEN 500 MG PO TABS
1000.0000 mg | ORAL_TABLET | Freq: Once | ORAL | Status: AC
  Administered 2021-07-11: 1000 mg via ORAL
  Filled 2021-07-11: qty 2

## 2021-07-11 MED ORDER — SENNA 8.6 MG PO TABS: 1.0000 | ORAL_TABLET | Freq: Every evening | ORAL | 0 refills | Status: DC | PRN

## 2021-07-11 MED ORDER — OXYCODONE HCL 5 MG PO TABS: 5.0000 mg | ORAL_TABLET | Freq: Three times a day (TID) | ORAL | 0 refills | Status: AC | PRN

## 2021-07-11 MED ORDER — ACETAMINOPHEN 500 MG PO TABS: 1000.0000 mg | ORAL_TABLET | Freq: Three times a day (TID) | ORAL | 0 refills | Status: AC | PRN

## 2021-07-11 NOTE — ED Notes (Signed)
Patient removed knee braces, stated it felt too tight and was causing increased pain.

## 2021-07-11 NOTE — ED Notes (Signed)
Patient unable to ambulate safely with crutches.  Instructed patient on use of walker and is able to ambulate much safer.  MD notified.

## 2021-07-11 NOTE — ED Provider Notes (Signed)
[facility] Emergency Department Provider Note  ____________________________________________  Time seen: Approximately 12:19 AM  I have reviewed the triage vital signs and the nursing notes.   HISTORY  Chief Complaint Fall   HPI Alexander [name] is a 70 y.o. male presents for evaluation of right knee pain.  Patient reports that 4 days ago he had 2 falls.  He is slipped in the middle of the night while going to the bathroom and then had a fall while working in the yard.  Since then he has had progressively worsening right knee pain and swelling.  Right now he reports that he has been unable to bear weight since earlier today because of severe pain.  The pain is mild with elevation but 10 out of 10 with weightbearing.  He denies any headache, neck pain, back pain, chest pain, abdominal pain.   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 Authorizing Provider  acetaminophen (TYLENOL) 500 MG tablet Take 2 tablets (1,000 mg total) by mouth every 8 (eight) hours as needed for mild pain, moderate pain,  fever or headache. 07/11/21 07/11/22 Yes Kandra Graven, [place], MD  oxyCODONE (ROXICODONE) 5 MG immediate release tablet Take 1 tablet (5 mg total) by mouth every 8 (eight) hours as needed. 07/11/21 07/11/22 Yes Argusta Mcgann, [place], MD  senna (SENOKOT) 8.6 MG TABS tablet Take 1 tablet (8.6 mg total) by mouth at bedtime as needed for mild constipation. 07/11/21  Yes Don Perking, [place], MD  aspirin EC 81 MG tablet Take 81 mg by mouth daily.    [provider]  atorvastatin (LIPITOR) 80 MG tablet Take 80 mg by mouth daily. 02/04/19   [provider]  carvedilol (COREG) 3.125 MG tablet Take 3.125 mg by mouth 2 (two) times daily with a meal.    [provider]  empagliflozin (JARDIANCE) 25 MG TABS tablet Take 25 mg by mouth daily. 12/27/18   [provider]  enalapril (VASOTEC) 10 MG tablet Take 10 mg by mouth every other day.    [provider]  furosemide (LASIX) 80 MG tablet Take 80 mg by mouth 2 (two) times a day. 08/25/16   [provider]  hydrochlorothiazide (HYDRODIURIL) 12.5 MG tablet Take 12.5 mg by mouth daily. 02/20/19   [provider]  insulin NPH-regular Human (NOVOLIN 70/30) (70-30) 100 UNIT/ML injection Inject 28 Units into the skin daily with breakfast. 24 units in the evening with dinner    [provider]  Semaglutide (OZEMPIC, 1 MG/DOSE, [place]) Inject 0.75 mLs into the skin once a week.  [provider]    Allergies Patient has no known allergies.  No family history on file.  Social History Social History   Tobacco Use   Smoking status: Never   Smokeless tobacco: Never  Substance Use Topics   Alcohol use: No   Drug use: No    Review of Systems  Constitutional: Negative for fever. Eyes: Negative for visual changes. ENT: Negative for facial injury or neck injury Cardiovascular: Negative for chest injury. Respiratory: Negative for shortness of breath. Negative for chest wall injury. Gastrointestinal:  Negative for abdominal pain or injury. Genitourinary: Negative for dysuria. Musculoskeletal: Negative for back injury, + R knee pain Skin: Negative for laceration/abrasions. Neurological: Negative for head injury.   ____________________________________________   PHYSICAL EXAM:  VITAL SIGNS: ED Triage Vitals  Enc Vitals Group     BP 07/10/21 1922 120/64     Pulse Rate 07/10/21 1922 70     Resp 07/10/21 1922 18     Temp 07/10/21 1922 98.3 F (36.8 C)     Temp Source 07/10/21 1922 Oral     SpO2 07/10/21 1922 95 %     Weight 07/10/21 1924 244 lb (110.7 kg)     Height 07/10/21 1924 6' (1.829 m)     Head Circumference --      Peak Flow --      Pain Score 07/10/21 1924 3     Pain Loc --      Pain Edu? --      Excl. in GC? --     Constitutional: Alert and oriented. No acute distress. Does not appear intoxicated. HEENT Head: Normocephalic and atraumatic. Face: No facial bony tenderness. Stable midface Ears: No hemotympanum bilaterally. No Battle sign Eyes: No eye injury. PERRL. No raccoon eyes Nose: Nontender. No epistaxis. No rhinorrhea Mouth/Throat: Mucous membranes are moist. No oropharyngeal blood. No dental injury. Airway patent without stridor. Normal voice. Neck: no C-collar. No midline c-spine tenderness.  Cardiovascular: Normal rate, regular rhythm. Normal and symmetric distal pulses are present in all extremities. Pulmonary/Chest: Chest wall is stable and nontender to palpation/compression. Normal respiratory effort. Breath sounds are normal. No crepitus.  Abdominal: Soft, nontender, non distended. Musculoskeletal: Swollen R knee with superficial abrasion, diffuse tenderness of the patella with no obvious deformity, no tenderness of the tibia, fibula, and femur. Nontender with normal full range of motion in all other extremities including bilateral hips. No deformities. No thoracic or lumbar midline spinal tenderness. Pelvis is stable. Skin: Skin is warm, dry and intact.  No abrasions or contutions. Psychiatric: Speech and behavior are appropriate. Neurological: Normal speech and language. Moves all extremities to command. No gross focal neurologic deficits are appreciated.  Glascow Coma Score: 4 - Opens eyes on own 6 - Follows simple motor commands 5 - Alert and oriented GCS: 15   ____________________________________________   LABS (all labs ordered are listed, but only abnormal results are displayed)  Labs Reviewed  CBC WITH DIFFERENTIAL/PLATELET - Abnormal; Notable for the following components:      Result Value   WBC 11.6 (*)    Neutro Abs 8.6 (*)    Monocytes Absolute 1.3 (*)    All other components within normal limits  COMPREHENSIVE METABOLIC PANEL - Abnormal; Notable for the following components:   CO2 20 (*)    Glucose, Bld 163 (*)    BUN 26 (*)    Total Bilirubin 1.5 (*)    All other components within normal limits   ____________________________________________  EKG  none  ____________________________________________  RADIOLOGY  I have personally reviewed the images performed during this visit and I agree with the Radiologist's read.   Interpretation by Radiologist:  DG Knee Complete 4 Views Right  Result Date: 07/10/2021 CLINICAL DATA:  Pain and swelling after fall EXAM: RIGHT KNEE - COMPLETE 4+ VIEW COMPARISON:  None. FINDINGS: Prepatellar soft tissue swelling. No joint effusion. No evidence of fracture or dislocation about joint. The patella is irregular on the oblique projection but no clear fracture identified. IMPRESSION: 1. Prepatellar soft tissue swelling. 2. No joint effusion. 3. No clear fracture identified. irregularity of patella. Recommend physical exam of the patella Electronically Signed   By: Genevive Bi M.D.   On: 07/10/2021 19:54     ____________________________________________   PROCEDURES  Procedure(s) performed: None Procedures Critical Care performed:   None ____________________________________________   INITIAL IMPRESSION / ASSESSMENT AND PLAN / ED COURSE  70 y.o. male presents for evaluation of right knee pain status post 2 falls 4 days ago.  Patient has significant swelling of the right knee with diffuse tenderness over the patella, no erythema or warmth.  There is a superficial abrasion.  X-ray of the knee concerning for irregular patella but no obvious fracture identified.  Based on physical exam I do believe patient might have a patella fracture.  Patella seems to be riding at the normal anatomical location.  We will put patient on a knee immobilizer, crutches, pain control, and follow-up with orthopedics for further evaluation.  Recommended nonweightbearing until seen by Ortho.      ____________________________________________  Please note:  Patient was evaluated in Emergency Department today for the symptoms described in the history of present illness. Patient was evaluated in the context of the global COVID-19 pandemic, which necessitated consideration that the patient might be at risk for infection with the SARS-CoV-2 virus that causes COVID-19. Institutional protocols and algorithms that pertain to the evaluation of patients at risk for COVID-19 are in a state of rapid change based on information released by regulatory bodies including the CDC and federal and state organizations. These policies and algorithms were followed during the patient's care in the ED.  Some ED evaluations and interventions may be delayed as a result of limited staffing during the pandemic.   ____________________________________________   FINAL CLINICAL IMPRESSION(S) / ED DIAGNOSES   Final diagnoses:  Fall, initial encounter  Closed nondisplaced fracture of right patella, unspecified fracture morphology, initial encounter      NEW MEDICATIONS STARTED DURING THIS VISIT:  ED Discharge Orders          Ordered    acetaminophen (TYLENOL) 500 MG tablet   Every 8 hours PRN        07/11/21 0208    oxyCODONE (ROXICODONE) 5 MG immediate release tablet  Every 8 hours PRN        07/11/21 0208    senna (SENOKOT) 8.6 MG TABS tablet  At bedtime PRN        07/11/21 0208             Note:  This document was prepared using Dragon voice recognition software and may include unintentional dictation errors.    Nita Sickle, MD 07/11/21 [id number]

## 2021-07-11 NOTE — Discharge Instructions (Signed)
Pain control: Take tylenol 1000mg every 8 hours. Take 5mg of oxycodone every 6 hours for breakthrough pain. If you need the oxycodone make sure to take one senokot as well to prevent constipation.  Do not drink alcohol, drive or participate in any other potentially dangerous activities while taking this medication as it may make you sleepy. Do not take this medication with any other sedating medications, either prescription or over-the-counter.  

## 2021-07-19 ENCOUNTER — Ambulatory Visit: Admit: 2021-07-19 | Payer: Medicare Other

## 2021-07-19 SURGERY — COLONOSCOPY WITH PROPOFOL
Anesthesia: General

## 2022-11-08 ENCOUNTER — Other Ambulatory Visit: Payer: Self-pay | Admitting: Ophthalmology

## 2022-11-08 DIAGNOSIS — H3582 Retinal ischemia: Secondary | ICD-10-CM

## 2022-11-15 ENCOUNTER — Ambulatory Visit
Admission: RE | Admit: 2022-11-15 | Discharge: 2022-11-15 | Disposition: A | Discharge status: Home / Self Care | Payer: Medicare Other | Source: Ambulatory Visit | Attending: Ophthalmology | Admitting: Ophthalmology

## 2022-11-15 DIAGNOSIS — H3582 Retinal ischemia: Secondary | ICD-10-CM | POA: Diagnosis present

## 2023-01-26 ENCOUNTER — Other Ambulatory Visit: Payer: Self-pay

## 2023-01-26 ENCOUNTER — Encounter: Payer: Self-pay | Admitting: Ophthalmology

## 2023-01-27 NOTE — Anesthesia Preprocedure Evaluation (Addendum)
Anesthesia Evaluation  Patient identified by MRN, date of birth, ID band Patient awake    Reviewed: Allergy & Precautions, H&P , NPO status , Patient's Chart, lab work & pertinent test results, reviewed documented beta blocker date and time   Airway Mallampati: IV  TM Distance: <3 FB Neck ROM: Full    Dental   Teeth are chipped and cracked:   Pulmonary neg pulmonary ROS   Pulmonary exam normal breath sounds clear to auscultation       Cardiovascular Exercise Tolerance: Good hypertension, Pt. on home beta blockers and Pt. on medications + CAD and +CHF  negative cardio ROS Normal cardiovascular exam Rhythm:Regular Rate:Normal     Neuro/Psych TIACVA, No Residual Symptoms  negative psych ROS   GI/Hepatic negative GI ROS, Neg liver ROS,GERD  ,,  Endo/Other  negative endocrine ROSdiabetes, Type 2, Insulin Dependent    Renal/GU negative Renal ROSHx kidney stones  negative genitourinary   Musculoskeletal negative musculoskeletal ROS (+)    Abdominal   Peds negative pediatric ROS (+)  Hematology negative hematology ROS (+)   Anesthesia Other Findings Coronary artery disease  Diabetes mellitus without complication Hypertension  Patient reports previous TIA, no residual, states he was told he had a stroke Anginal pain  History of kidney stones CHF (congestive heart failure) GERD (gastroesophageal reflux disease)    Reproductive/Obstetrics negative OB ROS                             Anesthesia Physical Anesthesia Plan  ASA: 3  Anesthesia Plan: MAC   Post-op Pain Management:    Induction: Intravenous  PONV Risk Score and Plan:   Airway Management Planned: Natural Airway and Nasal Cannula  Additional Equipment:   Intra-op Plan:   Post-operative Plan:   Informed Consent: I have reviewed the patients History and Physical, chart, labs and discussed the procedure including the risks,  benefits and alternatives for the proposed anesthesia with the patient or authorized representative who has indicated his/her understanding and acceptance.     Dental Advisory Given  Plan Discussed with: Anesthesiologist, CRNA and Surgeon  Anesthesia Plan Comments: (Patient consented for risks of anesthesia including but not limited to:  - adverse reactions to medications - damage to eyes, teeth, lips or other oral mucosa - nerve damage due to positioning  - sore throat or hoarseness - Damage to heart, brain, nerves, lungs, other parts of body or loss of life  Patient voiced understanding.)        Anesthesia Quick Evaluation

## 2023-02-03 NOTE — Discharge Instructions (Signed)

## 2023-02-06 ENCOUNTER — Ambulatory Visit: Payer: Medicare Other | Admitting: Anesthesiology

## 2023-02-06 ENCOUNTER — Encounter: Payer: Self-pay | Admitting: Ophthalmology

## 2023-02-06 ENCOUNTER — Other Ambulatory Visit: Payer: Self-pay

## 2023-02-06 ENCOUNTER — Encounter
Admission: RE | Disposition: A | Discharge status: Home / Self Care | Payer: Self-pay | Source: Home / Self Care | Attending: Ophthalmology

## 2023-02-06 ENCOUNTER — Ambulatory Visit
Admission: RE | Admit: 2023-02-06 | Discharge: 2023-02-06 | Disposition: A | Discharge status: Home / Self Care | Payer: Medicare Other | Attending: Ophthalmology | Admitting: Ophthalmology

## 2023-02-06 DIAGNOSIS — H2511 Age-related nuclear cataract, right eye: Secondary | ICD-10-CM | POA: Insufficient documentation

## 2023-02-06 DIAGNOSIS — Z794 Long term (current) use of insulin: Secondary | ICD-10-CM | POA: Insufficient documentation

## 2023-02-06 DIAGNOSIS — E1136 Type 2 diabetes mellitus with diabetic cataract: Secondary | ICD-10-CM | POA: Insufficient documentation

## 2023-02-06 DIAGNOSIS — Z8673 Personal history of transient ischemic attack (TIA), and cerebral infarction without residual deficits: Secondary | ICD-10-CM | POA: Diagnosis not present

## 2023-02-06 DIAGNOSIS — H4089 Other specified glaucoma: Secondary | ICD-10-CM | POA: Insufficient documentation

## 2023-02-06 DIAGNOSIS — I2510 Atherosclerotic heart disease of native coronary artery without angina pectoris: Secondary | ICD-10-CM | POA: Diagnosis not present

## 2023-02-06 DIAGNOSIS — H42 Glaucoma in diseases classified elsewhere: Secondary | ICD-10-CM | POA: Diagnosis not present

## 2023-02-06 DIAGNOSIS — I110 Hypertensive heart disease with heart failure: Secondary | ICD-10-CM | POA: Insufficient documentation

## 2023-02-06 DIAGNOSIS — E1139 Type 2 diabetes mellitus with other diabetic ophthalmic complication: Secondary | ICD-10-CM | POA: Diagnosis not present

## 2023-02-06 DIAGNOSIS — I509 Heart failure, unspecified: Secondary | ICD-10-CM | POA: Insufficient documentation

## 2023-02-06 HISTORY — PX: CATARACT EXTRACTION W/PHACO: SHX586

## 2023-02-06 HISTORY — DX: Heart failure, unspecified: I50.9

## 2023-02-06 HISTORY — DX: Gastro-esophageal reflux disease without esophagitis: K21.9

## 2023-02-06 LAB — GLUCOSE, CAPILLARY: Glucose-Capillary: 144 mg/dL — ABNORMAL HIGH (ref 70–99)

## 2023-02-06 SURGERY — PHACOEMULSIFICATION, CATARACT, WITH IOL INSERTION
Anesthesia: Monitor Anesthesia Care | Site: Eye | Laterality: Right

## 2023-02-06 MED ORDER — PROPOFOL 500 MG/50ML IV EMUL
INTRAVENOUS | Status: DC | PRN
  Administered 2023-02-06: 40 mg via INTRAVENOUS

## 2023-02-06 MED ORDER — ARMC OPHTHALMIC DILATING DROPS
1.0000 | OPHTHALMIC | Status: DC | PRN
  Administered 2023-02-06 (×3): 1 via OPHTHALMIC

## 2023-02-06 MED ORDER — NEOMYCIN-POLYMYXIN-DEXAMETH 3.5-10000-0.1 OP OINT
TOPICAL_OINTMENT | OPHTHALMIC | Status: DC | PRN
  Administered 2023-02-06: 1 via OPHTHALMIC

## 2023-02-06 MED ORDER — SIGHTPATH DOSE#1 BSS IO SOLN
INTRAOCULAR | Status: DC | PRN
  Administered 2023-02-06: 86 mL via OPHTHALMIC

## 2023-02-06 MED ORDER — LIDOCAINE HCL (PF) 2 % IJ SOLN
INTRAOCULAR | Status: DC | PRN
  Administered 2023-02-06: 1 mL via INTRAOCULAR

## 2023-02-06 MED ORDER — MOXIFLOXACIN HCL 0.5 % OP SOLN
OPHTHALMIC | Status: DC | PRN
  Administered 2023-02-06: .2 mL via OPHTHALMIC

## 2023-02-06 MED ORDER — LIDOCAINE HCL 2 % IJ SOLN
INTRAMUSCULAR | Status: DC | PRN
  Administered 2023-02-06: 4 mL via OPHTHALMIC

## 2023-02-06 MED ORDER — LACTATED RINGERS IV SOLN: INTRAVENOUS | Status: DC

## 2023-02-06 MED ORDER — TETRACAINE HCL 0.5 % OP SOLN
1.0000 [drp] | OPHTHALMIC | Status: DC | PRN
  Administered 2023-02-06 (×3): 1 [drp] via OPHTHALMIC

## 2023-02-06 MED ORDER — SIGHTPATH DOSE#1 BSS IO SOLN
INTRAOCULAR | Status: DC | PRN
  Administered 2023-02-06 (×3): 15 mL

## 2023-02-06 MED ORDER — MIDAZOLAM HCL 2 MG/2ML IJ SOLN
INTRAMUSCULAR | Status: DC | PRN
  Administered 2023-02-06 (×2): 1 mg via INTRAVENOUS

## 2023-02-06 MED ORDER — FENTANYL CITRATE (PF) 100 MCG/2ML IJ SOLN
INTRAMUSCULAR | Status: DC | PRN
  Administered 2023-02-06 (×2): 50 ug via INTRAVENOUS

## 2023-02-06 MED ORDER — SIGHTPATH DOSE#1 NA HYALUR & NA CHOND-NA HYALUR IO KIT
PACK | INTRAOCULAR | Status: DC | PRN
  Administered 2023-02-06: 1 via OPHTHALMIC

## 2023-02-06 SURGICAL SUPPLY — 23 items
ALLOGRAFT TUTOPLST SCER0.5X1.0 (Tissue) IMPLANT
CATARACT SUITE SIGHTPATH (MISCELLANEOUS) ×1 IMPLANT
CORD BIP STRL DISP 12FT (MISCELLANEOUS) IMPLANT
DISSECTOR HYDRO NUCLEUS 50X22 (MISCELLANEOUS) ×1 IMPLANT
ERASER HMR WETFIELD 18G (MISCELLANEOUS) IMPLANT
FEE CATARACT SUITE SIGHTPATH (MISCELLANEOUS) ×1 IMPLANT
GLOVE SURG GAMMEX PI TX LF 7.5 (GLOVE) ×1 IMPLANT
GLOVE SURG SYN 8.5  E (GLOVE) ×1
GLOVE SURG SYN 8.5 E (GLOVE) ×1 IMPLANT
GLOVE SURG SYN 8.5 PF PI (GLOVE) ×1 IMPLANT
LENS IOL TECNIS EYHANCE 19.0 (Intraocular Lens) IMPLANT
NDL FILTER BLUNT 18X1 1/2 (NEEDLE) ×1 IMPLANT
NDL RETROBULBAR 25GX1.5 STRL (NEEDLE) IMPLANT
NEEDLE FILTER BLUNT 18X1 1/2 (NEEDLE) ×1 IMPLANT
PROTECTOR LASIK FLAP (MISCELLANEOUS) IMPLANT
SUT ETHILON 10-0 CS-B-6CS-B-6 (SUTURE) ×1
SUT ETHILON 9-0 (SUTURE) IMPLANT
SUT VICRYL 7 0 TG140 8 (SUTURE) IMPLANT
SUTURE EHLN 10-0 CS-B-6CS-B-6 (SUTURE) IMPLANT
SYR 3ML LL SCALE MARK (SYRINGE) ×1 IMPLANT
SYR 5ML LL (SYRINGE) ×1 IMPLANT
TUTOPLAST SCIERA 0.5X1.0 (Tissue) ×1 IMPLANT
VALVE GLAUCOMA AHMED (Prosthesis & Implant Heart) IMPLANT

## 2023-02-06 NOTE — Anesthesia Postprocedure Evaluation (Signed)
Anesthesia Post Note  Patient: [name]  Procedure(s) Performed: CATARACT EXTRACTION PHACO AND INTRAOCULAR LENS PLACEMENT (IOC) AHMED TUBE SHUNT W/ TUTOPLAST RIGHT DIABETIC  3.94   00:42.6 (Right: Eye)  Patient location during evaluation: PACU Anesthesia Type: MAC Level of consciousness: awake and alert Pain management: pain level controlled Vital Signs Assessment: post-procedure vital signs reviewed and stable Respiratory status: spontaneous breathing, nonlabored ventilation, respiratory function stable and patient connected to nasal cannula oxygen Cardiovascular status: stable and blood pressure returned to baseline Postop Assessment: no apparent nausea or vomiting Anesthetic complications: no   No notable events documented.   Last Vitals:  Vitals:   02/06/23 0856 02/06/23 0902  BP: (!) 121/53 (!) 126/54  Pulse: (!) 51 (!) 53  Resp: 11 15  Temp: 36.5 C 36.5 C  SpO2: 97% 94%    Last Pain:  Vitals:   02/06/23 0902  TempSrc:   PainSc: 0-No pain                 Winford Hehn C Shalamar Crays

## 2023-02-06 NOTE — Transfer of Care (Signed)
Immediate Anesthesia Transfer of Care Note  Patient: Alexander [name]  Procedure(s) Performed: CATARACT EXTRACTION PHACO AND INTRAOCULAR LENS PLACEMENT (IOC) AHMED TUBE SHUNT W/ TUTOPLAST RIGHT DIABETIC  3.94   00:42.6 (Right: Eye)  Patient Location: PACU  Anesthesia Type: MAC  Level of Consciousness: awake, alert  and patient cooperative  Airway and Oxygen Therapy: Patient Spontanous Breathing and Patient connected to supplemental oxygen  Post-op Assessment: Post-op Vital signs reviewed, Patient's Cardiovascular Status Stable, Respiratory Function Stable, Patent Airway and No signs of Nausea or vomiting  Post-op Vital Signs: Reviewed and stable  Complications: No notable events documented.

## 2023-02-06 NOTE — H&P (Signed)
[facility]   Primary Care Physician:  Myrene Buddy, NP Ophthalmologist: Dr. Willey Blade  Pre-Procedure History & Physical: HPI:  Alexander [name] is a 72 y.o. male here for cataract surgery.   Past Medical History:  Diagnosis Date   Anginal pain    CHF (congestive heart failure)    Coronary artery disease    Diabetes mellitus without complication    Type 2   GERD (gastroesophageal reflux disease)    History of kidney stones    Hypertension    Stroke    mini, tingling right hand    Past Surgical History:  Procedure Laterality Date   CARDIAC CATHETERIZATION Left 08/09/2016   Procedure: Left Heart Cath and Coronary Angiography;  Surgeon: Dalia Heading, MD;  Location: [facility] INVASIVE CV LAB;  Service: Cardiovascular;  Laterality: Left;   COLONOSCOPY WITH PROPOFOL N/A 02/02/2021   Procedure: COLONOSCOPY WITH PROPOFOL;  Surgeon: Regis Bill, MD;  Location: [facility] ENDOSCOPY;  Service: Endoscopy;  Laterality: N/A;   CORONARY ARTERY BYPASS GRAFT     2017 x5    Prior to Admission medications   Medication Sig Start Date End Date Taking? Authorizing Provider  aspirin EC 81 MG tablet Take 81 mg by mouth daily.   Yes [provider]  atorvastatin (LIPITOR) 80 MG tablet Take 80 mg by mouth daily. 02/04/19  Yes [provider]  carvedilol (COREG) 3.125 MG tablet Take 3.125 mg by mouth 2 (two) times daily with a meal.   Yes [provider]  empagliflozin (JARDIANCE) 25 MG TABS tablet Take 25 mg by mouth daily. 12/27/18  Yes [provider]  enalapril (VASOTEC) 10 MG tablet Take 10 mg by mouth every other day.   Yes [provider]  furosemide (LASIX) 80 MG tablet Take 80 mg by mouth 2 (two) times a day. 08/25/16  Yes [provider]  hydrochlorothiazide (HYDRODIURIL) 12.5 MG tablet Take 12.5 mg by mouth daily. 02/20/19  Yes [provider]  insulin NPH-regular Human (NOVOLIN 70/30) (70-30) 100 UNIT/ML injection  Inject 28 Units into the skin daily with breakfast. 24 units in the evening with dinner   Yes [provider]  Semaglutide (OZEMPIC, 1 MG/DOSE, [place]) Inject 0.75 mLs into the skin once a week. Due on Thursday   Yes [provider]  senna (SENOKOT) 8.6 MG TABS tablet Take 1 tablet (8.6 mg total) by mouth at bedtime as needed for mild constipation. 07/11/21  Yes Don Perking, [place], MD    Allergies as of 01/23/2023   (No Known Allergies)    History reviewed. No pertinent family history.  Social History   Socioeconomic History   Marital status: Married    Spouse name: Not on file   Number of children: Not on file   Years of education: Not on file   Highest education level: Not on file  Occupational History   Not on file  Tobacco Use   Smoking status: Never   Smokeless tobacco: Never  Substance and Sexual Activity   Alcohol use: No   Drug use: No   Sexual activity: Not on file  Other Topics Concern   Not on file  Social History Narrative   Not on file   Social Determinants of Health   Financial Resource Strain: Not on file  Food Insecurity: Not on file  Transportation Needs: Not on file  Physical Activity: Not on file  Stress: Not on file  Social Connections: Not on file  Intimate Partner Violence:  Not on file    Review of Systems: See HPI, otherwise negative ROS  Physical Exam: BP (!) 144/77   Pulse 61   Temp 97.6 F (36.4 C) (Temporal)   Resp 14   Ht 6' (1.829 m)   Wt 112.5 kg   SpO2 97%   BMI 33.63 kg/m  General:   Alert, cooperative in NAD Head:  Normocephalic and atraumatic. Respiratory:  Normal work of breathing. Cardiovascular:  RRR  Impression/Plan: Alexander [name] is here for cataract surgery.  Risks, benefits, limitations, and alternatives regarding cataract surgery have been reviewed with the patient.  Questions have been answered.  All parties agreeable.   Willey Blade, MD  02/06/2023, 7:16 AM

## 2023-02-06 NOTE — Op Note (Signed)
OPERATIVE NOTE  Alexander [name] [id number] 02/06/2023   PREOPERATIVE DIAGNOSIS:   1.  Neovascular glaucoma, right eye.  2.  Nuclear sclerotic cataract, right eye.   POSTOPERATIVE DIAGNOSIS:     1.  Neovascular glaucoma, right eye.   2.  Nuclear sclerotic cataract, right eye.   PROCEDURE:   1.  Ahmed tube shunt and placement of tutoplast CPT 66180 2.  Phacoemulsification of cataract and placement of intraocular lens. CPT [phone]  IMPLANT: Implant Name Type Inv. Item Serial No. Manufacturer Lot No. LRB No. Used Action  VALVE GLAUCOMA AHMED - [id number] Prosthesis & Implant Heart VALVE GLAUCOMA AHMED [id number] [employer] [id number] Right 1 Implanted  TUTOPLAST SCIERA 0.5X1.0 - [id number] Tissue TUTOPLAST SCIERA 0.5X1.0 [id number] [facility] PRODUCTS  Right 1 Implanted  LENS IOL TECNIS EYHANCE 19.0 - [id number] Intraocular Lens LENS IOL TECNIS EYHANCE 19.0 [id number] [employer]  Right 1 Implanted       SURGEON:  Willey Blade, MD, MPH   ANESTHESIA:  MAC and retrobulbar block with 50%/50% mix of 0.75% bupivicaine and 2% preservative free lidocaine with a small amount of vitrase.  ESTIMATED BLOOD LOSS: <1 mL   COMPLICATIONS:  None.  ANESTHESIOLOGIST: Anesthesiologist: Marisue Humble, MD CRNA: Barbette Hair, CRNA  ULTRASOUND TIME: 0 minutes 42 seconds.  CDE 3.94     DESCRIPTION OF PROCEDURE:  The patient was identified in the holding room and transported to the operating room and placed in the supine position.  A time out was called identifiying the right eye as the operative eye and a retrobulbar block was administered in the standard fashion.  It was prepped and draped in the usual sterile ophthalmic fashion.  A 1.0 millimeter clear-corneal paracentesis was made at the 10:30 position. 0.5 ml of preservative-free 1% lidocaine with epinephrine was injected into the anterior chamber. The anterior chamber was filled with Healon 5 viscoelastic.  A 2.4 millimeter keratome was used to make a  near-clear corneal incision at the 8:00 position.  A curvilinear capsulorrhexis was made with a cystotome and capsulorrhexis forceps.  Balanced salt solution was used to hydrodissect and hydrodelineate the nucleus.   Phacoemulsification was then used in stop and chop fashion to remove the lens nucleus and epinucleus.  The remaining cortex was then removed using the irrigation and aspiration handpiece. Healon was then placed into the capsular bag to distend it for lens placement.  A lens was then injected into the capsular bag.  The remaining viscoelastic was aspirated.   Wounds were hydrated with balanced salt solution.  The anterior chamber was inflated to a physiologic pressure with balanced salt solution.   A 10-0 nylon suture was placed through the clear corneal incision and the knot buried.    Attention was turned to the Ahmed tube procedure  A marking pen was used to mark the 12:00 and 9:00 positions at the limbus.   A 5-0 silk traction suture was placed through the cornea and the eye rotated to expose the superotemporal quadrant.  A corneal sponge was placed to keep the cornea moist.  The conj was dissected from the limbus and posteriorly.  Radial relaxing incisions were made in the conjunctiva.  A 19 gauge pencil cautery was used to obtain hemostasis for the exposed conjunctiva.  The calipers were set to 8.5 mm and the sclera was marked posterior to the limbus. The Ahmed tube shunt was brought onto the field and was primed with BSS on a 27 gauge cannula.  Visualization of the fluid  from the plate was confirmed. The plate was secured with two 9-0 nylon on a spatulated needle with partial thickness bites throught the sclera at the premarked sites and the sutures were buried in the eyelets of the plate.  The tube was trimmed to the appropriate length with an anterior bevel with westcott scissors.  A 23 gauge needle was used to create a short scleral tunnel and enter into the anterior  chamber parallel to the iris.  The tube was inserted with tube inserter forceps and was in good position, not contacting the cornea.  The tutoplast was brought onto the field and trimmed and secured using 7-0 vicryl sutures.  The conjunctiva was reapproximated to the limbus with an 7-0 vicyrl suture running to the reapproximate the relaxing incisions at both 3:00 and 12:00 positions.   Intracameral vigamox 0.1 mL undiluted was injected into the eye and a drop placed onto the ocular surface.  The lid speculum was removed, the face was cleaned with a wet and dry.  Maxitrol ointment, a patch and shield were placed over the left eye.   The patient was taken to the recovery room in stable condition without complications of anesthesia or surgery.  The patient is to leave the patch and shield in place and we will see them in the clinic for followup tomorrow.  Willey Blade 02/06/2023, 8:54 AM

## 2023-02-07 ENCOUNTER — Encounter: Payer: Self-pay | Admitting: Ophthalmology

## 2023-05-02 IMAGING — CR DG KNEE COMPLETE 4+V*R*
1 series · 4 of 4 positions shown · non-contrast
Comparison: None.

CLINICAL DATA: Pain and swelling after fall

EXAM:
RIGHT KNEE - COMPLETE 4+ VIEW

[Series 1: dg knee complete 4 views right · 0.14mm/px · 4 of 4 slices shown]
[im 1/4]
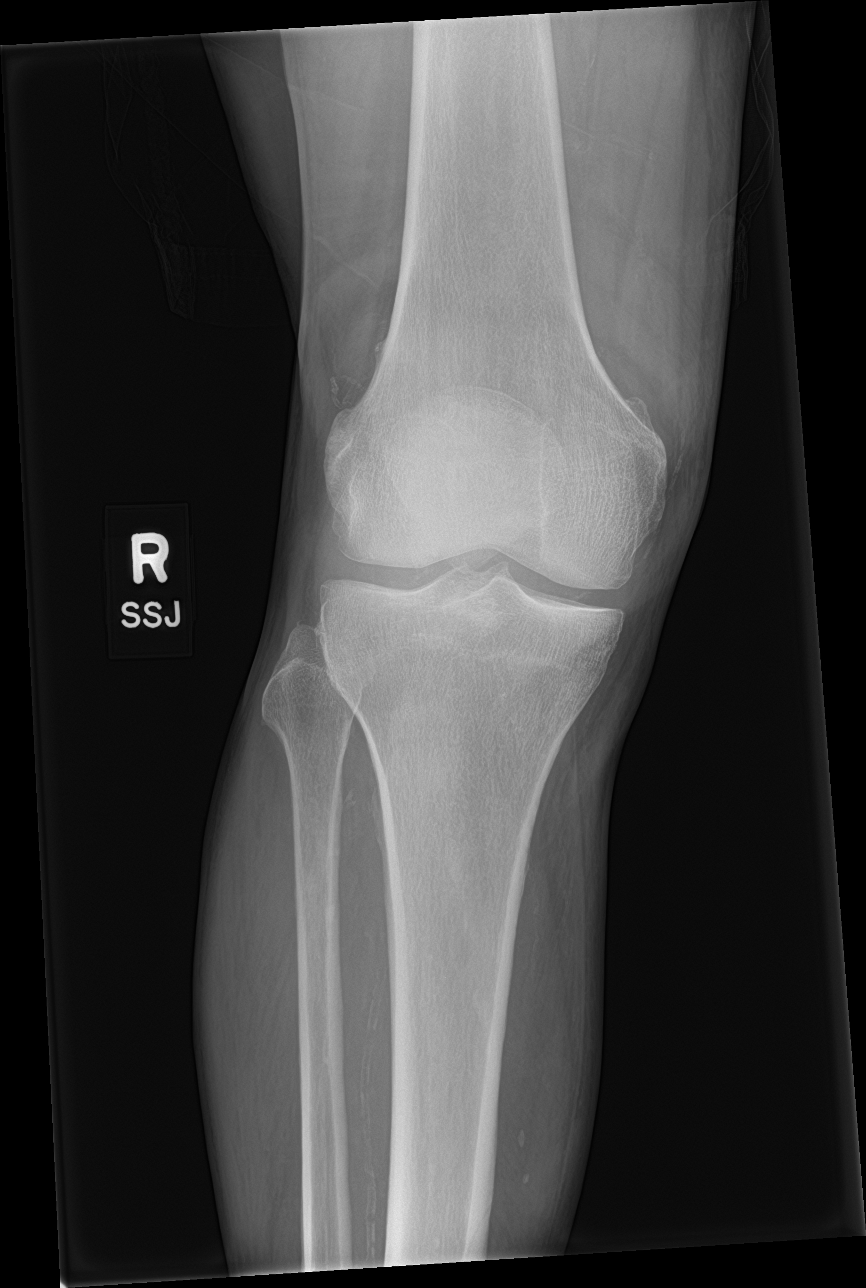
[im 2/4]
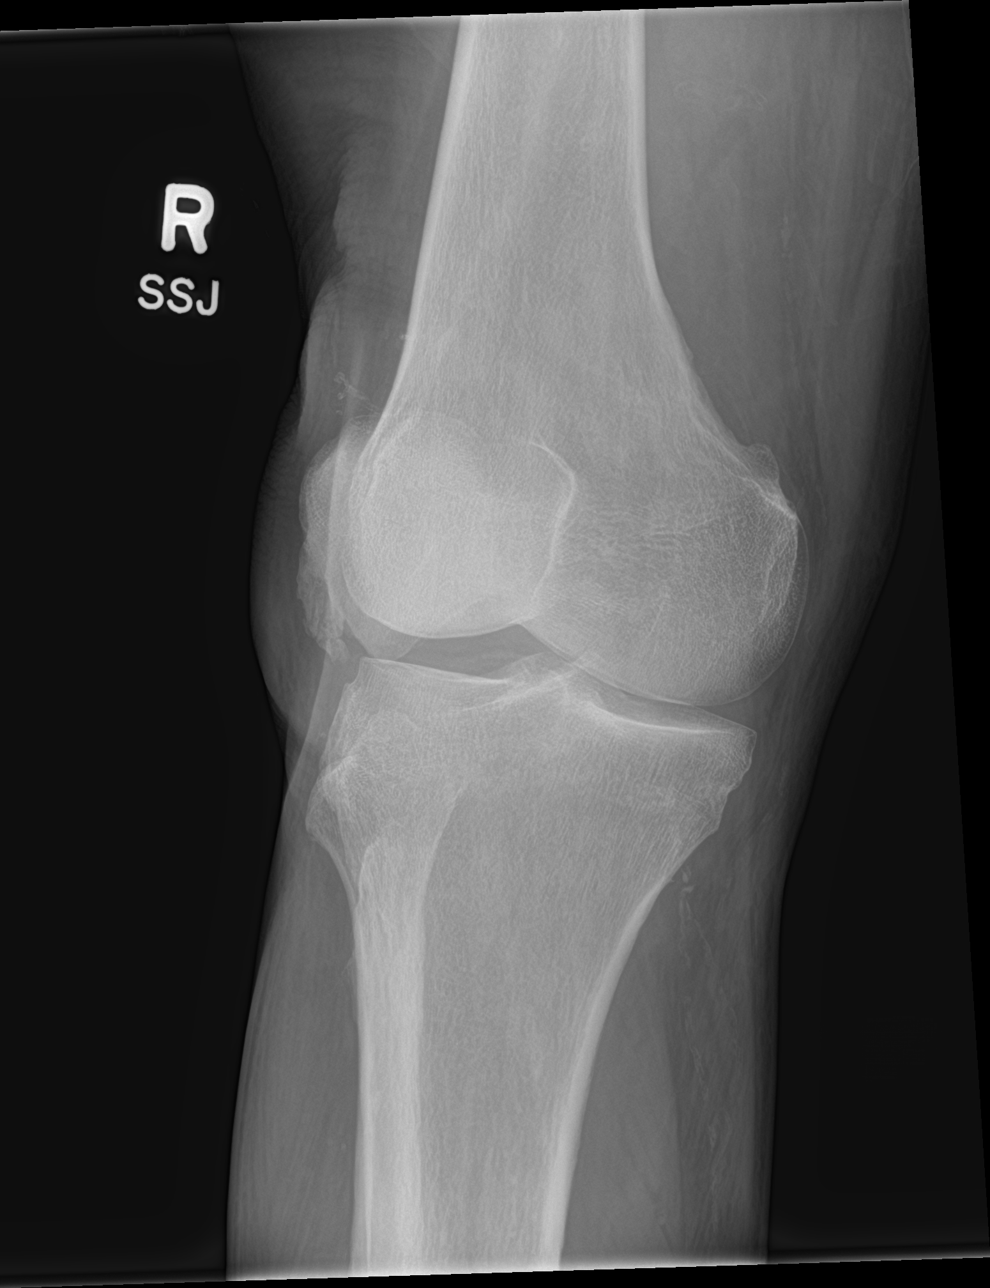
[im 3/4]
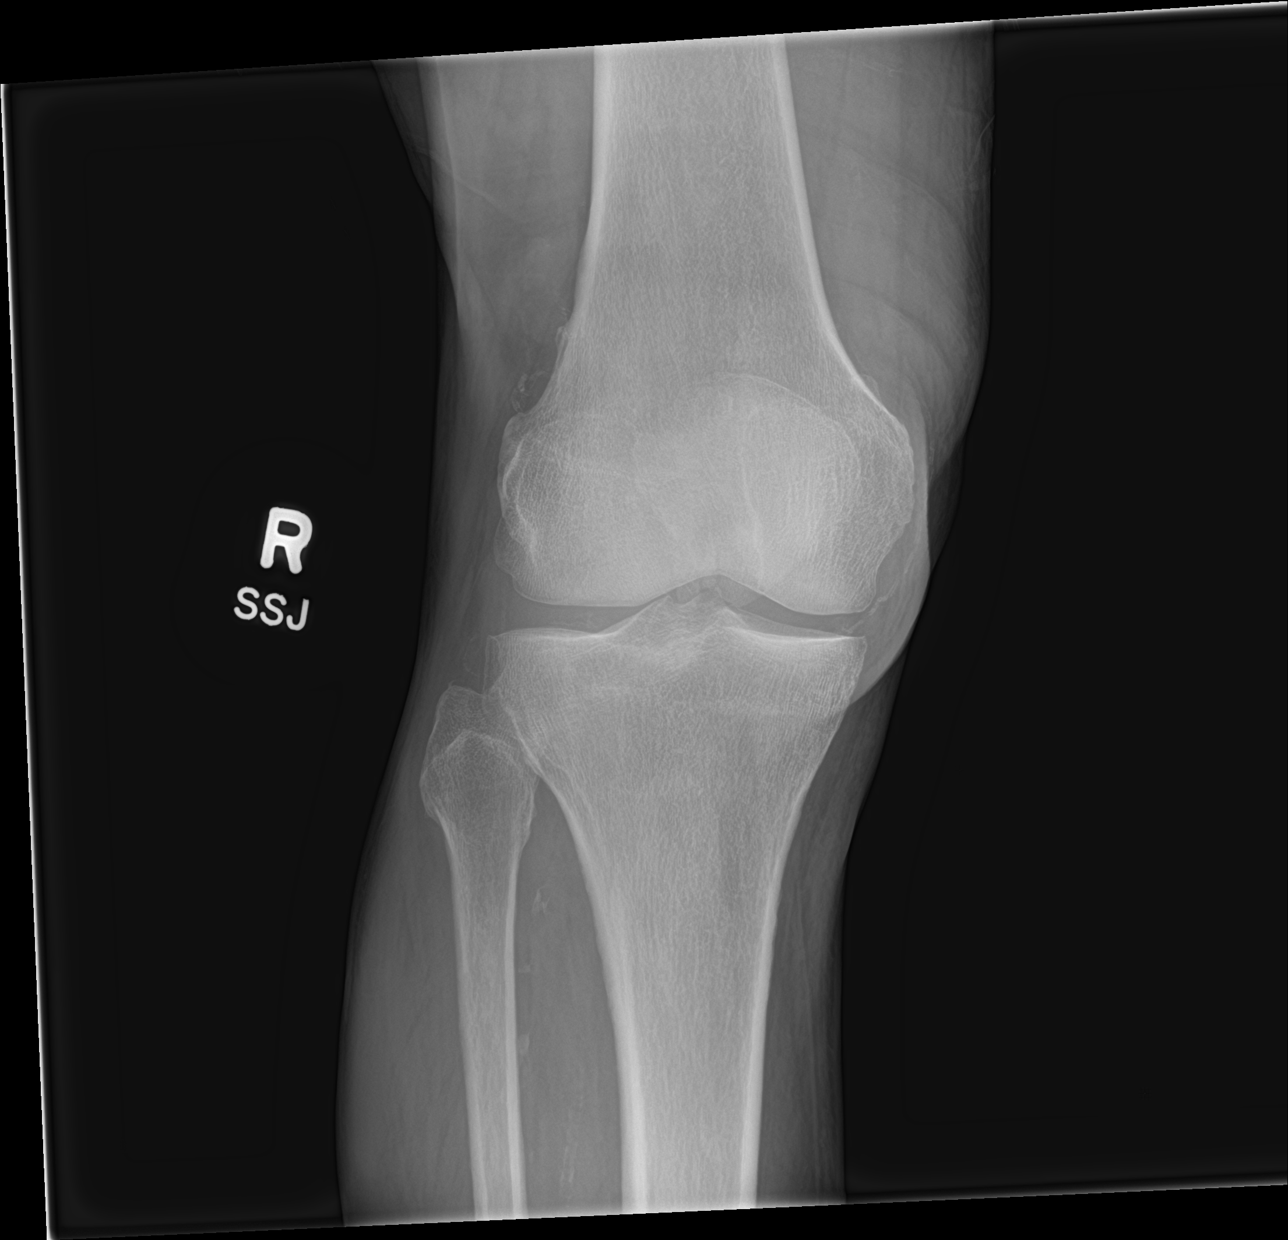
[im 4/4]
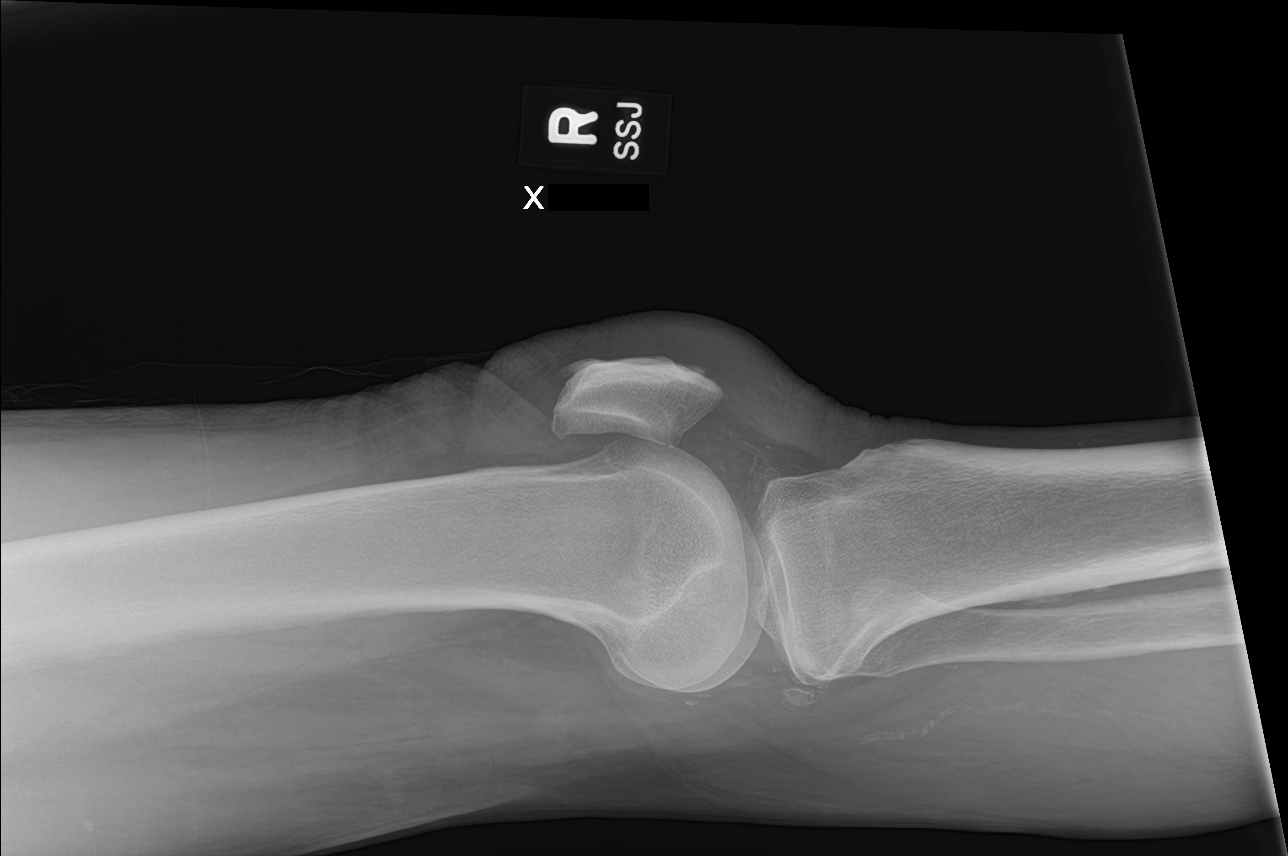

[4 of 4 positions shown; findings below may reference images not displayed]

FINDINGS: Prepatellar soft tissue swelling. No joint effusion. No evidence of
fracture or dislocation about joint. The patella is irregular on the
oblique projection but no clear fracture identified.
IMPRESSION: 1. Prepatellar soft tissue swelling.
2. No joint effusion.
3. No clear fracture identified. irregularity of patella. Recommend
physical exam of the patella

## 2023-07-05 ENCOUNTER — Emergency Department: Payer: Medicare Other

## 2023-07-05 ENCOUNTER — Observation Stay
Admission: EM | Admit: 2023-07-05 | Discharge: 2023-07-06 | Disposition: A | Discharge status: Home / Self Care | Payer: Medicare Other | Attending: Internal Medicine | Admitting: Internal Medicine

## 2023-07-05 ENCOUNTER — Other Ambulatory Visit: Payer: Self-pay

## 2023-07-05 ENCOUNTER — Encounter: Payer: Self-pay | Admitting: Emergency Medicine

## 2023-07-05 DIAGNOSIS — R509 Fever, unspecified: Secondary | ICD-10-CM | POA: Diagnosis present

## 2023-07-05 DIAGNOSIS — Z7984 Long term (current) use of oral hypoglycemic drugs: Secondary | ICD-10-CM | POA: Insufficient documentation

## 2023-07-05 DIAGNOSIS — Z8673 Personal history of transient ischemic attack (TIA), and cerebral infarction without residual deficits: Secondary | ICD-10-CM | POA: Insufficient documentation

## 2023-07-05 DIAGNOSIS — Z1152 Encounter for screening for COVID-19: Secondary | ICD-10-CM | POA: Insufficient documentation

## 2023-07-05 DIAGNOSIS — Z794 Long term (current) use of insulin: Secondary | ICD-10-CM

## 2023-07-05 DIAGNOSIS — Z79899 Other long term (current) drug therapy: Secondary | ICD-10-CM | POA: Insufficient documentation

## 2023-07-05 DIAGNOSIS — I10 Essential (primary) hypertension: Secondary | ICD-10-CM

## 2023-07-05 DIAGNOSIS — E11319 Type 2 diabetes mellitus with unspecified diabetic retinopathy without macular edema: Secondary | ICD-10-CM

## 2023-07-05 DIAGNOSIS — J189 Pneumonia, unspecified organism: Principal | ICD-10-CM

## 2023-07-05 DIAGNOSIS — I110 Hypertensive heart disease with heart failure: Secondary | ICD-10-CM | POA: Diagnosis not present

## 2023-07-05 DIAGNOSIS — M545 Low back pain, unspecified: Secondary | ICD-10-CM | POA: Insufficient documentation

## 2023-07-05 DIAGNOSIS — Z7982 Long term (current) use of aspirin: Secondary | ICD-10-CM | POA: Diagnosis not present

## 2023-07-05 DIAGNOSIS — Z7985 Long-term (current) use of injectable non-insulin antidiabetic drugs: Secondary | ICD-10-CM | POA: Insufficient documentation

## 2023-07-05 DIAGNOSIS — I5022 Chronic systolic (congestive) heart failure: Secondary | ICD-10-CM | POA: Diagnosis not present

## 2023-07-05 DIAGNOSIS — E872 Acidosis, unspecified: Secondary | ICD-10-CM | POA: Diagnosis not present

## 2023-07-05 DIAGNOSIS — A419 Sepsis, unspecified organism: Principal | ICD-10-CM

## 2023-07-05 DIAGNOSIS — E1169 Type 2 diabetes mellitus with other specified complication: Secondary | ICD-10-CM

## 2023-07-05 DIAGNOSIS — I25810 Atherosclerosis of coronary artery bypass graft(s) without angina pectoris: Secondary | ICD-10-CM

## 2023-07-05 DIAGNOSIS — Z951 Presence of aortocoronary bypass graft: Secondary | ICD-10-CM | POA: Diagnosis not present

## 2023-07-05 DIAGNOSIS — E119 Type 2 diabetes mellitus without complications: Secondary | ICD-10-CM | POA: Diagnosis not present

## 2023-07-05 DIAGNOSIS — I2510 Atherosclerotic heart disease of native coronary artery without angina pectoris: Secondary | ICD-10-CM | POA: Insufficient documentation

## 2023-07-05 DIAGNOSIS — R809 Proteinuria, unspecified: Secondary | ICD-10-CM | POA: Insufficient documentation

## 2023-07-05 HISTORY — DX: Sepsis, unspecified organism: A41.9

## 2023-07-05 HISTORY — DX: Pneumonia, unspecified organism: J18.9

## 2023-07-05 LAB — URINALYSIS, W/ REFLEX TO CULTURE (INFECTION SUSPECTED)
Bacteria, UA: NONE SEEN
Bilirubin Urine: NEGATIVE
Glucose, UA: >=500 mg/dL — AB
Hgb urine dipstick: NEGATIVE
Ketones, ur: 80 mg/dL — AB
Leukocytes,Ua: NEGATIVE
Nitrite: NEGATIVE
Protein, ur: 100 mg/dL — AB
Specific Gravity, Urine: 1.032 — ABNORMAL HIGH (ref 1.005–1.030)
pH: 5 (ref 5.0–8.0)

## 2023-07-05 LAB — PROCALCITONIN: Procalcitonin: 0.24 ng/mL

## 2023-07-05 LAB — CBC WITH DIFFERENTIAL/PLATELET
Abs Immature Granulocytes: 0.05 10*3/uL (ref 0.00–0.07)
Basophils Absolute: 0 10*3/uL (ref 0.0–0.1)
Basophils Relative: 0 %
Eosinophils Absolute: 0 10*3/uL (ref 0.0–0.5)
Eosinophils Relative: 0 %
HCT: 42.7 % (ref 39.0–52.0)
Hemoglobin: 13.8 g/dL (ref 13.0–17.0)
Immature Granulocytes: 0 %
Lymphocytes Relative: 7 %
Lymphs Abs: 0.8 10*3/uL (ref 0.7–4.0)
MCH: 30.7 pg (ref 26.0–34.0)
MCHC: 32.3 g/dL (ref 30.0–36.0)
MCV: 95.1 fL (ref 80.0–100.0)
Monocytes Absolute: 1.3 10*3/uL — ABNORMAL HIGH (ref 0.1–1.0)
Monocytes Relative: 11 %
Neutro Abs: 9.9 10*3/uL — ABNORMAL HIGH (ref 1.7–7.7)
Neutrophils Relative %: 82 %
Platelets: 167 10*3/uL (ref 150–400)
RBC: 4.49 MIL/uL (ref 4.22–5.81)
RDW: 14.3 % (ref 11.5–15.5)
WBC: 12.1 10*3/uL — ABNORMAL HIGH (ref 4.0–10.5)
nRBC: 0 % (ref 0.0–0.2)

## 2023-07-05 LAB — RESP PANEL BY RT-PCR (FLU A&B, COVID) ARPGX2
Influenza A by PCR: NEGATIVE
Influenza B by PCR: NEGATIVE
SARS Coronavirus 2 by RT PCR: NEGATIVE

## 2023-07-05 LAB — CBG MONITORING, ED
Glucose-Capillary: 149 mg/dL — ABNORMAL HIGH (ref 70–99)
Glucose-Capillary: 150 mg/dL — ABNORMAL HIGH (ref 70–99)

## 2023-07-05 LAB — COMPREHENSIVE METABOLIC PANEL
ALT: 29 U/L (ref 0–44)
AST: 23 U/L (ref 15–41)
Albumin: 3.8 g/dL (ref 3.5–5.0)
Alkaline Phosphatase: 73 U/L (ref 38–126)
Anion gap: 17 — ABNORMAL HIGH (ref 5–15)
BUN: 21 mg/dL (ref 8–23)
CO2: 17 mmol/L — ABNORMAL LOW (ref 22–32)
Calcium: 8.7 mg/dL — ABNORMAL LOW (ref 8.9–10.3)
Chloride: 100 mmol/L (ref 98–111)
Creatinine, Ser: 0.83 mg/dL (ref 0.61–1.24)
GFR, Estimated: >60 mL/min (ref >60)
Glucose, Bld: 192 mg/dL — ABNORMAL HIGH (ref 70–99)
Potassium: 3.6 mmol/L (ref 3.5–5.1)
Sodium: 134 mmol/L — ABNORMAL LOW (ref 135–145)
Total Bilirubin: 3.1 mg/dL — ABNORMAL HIGH (ref 0.3–1.2)
Total Protein: 7.1 g/dL (ref 6.5–8.1)

## 2023-07-05 LAB — LACTIC ACID, PLASMA
Lactic Acid, Venous: 2 mmol/L (ref 0.5–1.9)
Lactic Acid, Venous: 1.8 mmol/L (ref 0.5–1.9)

## 2023-07-05 LAB — APTT: aPTT: 32 s (ref 24–36)

## 2023-07-05 LAB — STREP PNEUMONIAE URINARY ANTIGEN: Strep Pneumo Urinary Antigen: NEGATIVE

## 2023-07-05 LAB — PROTIME-INR
INR: 1.3 — ABNORMAL HIGH (ref 0.8–1.2)
Prothrombin Time: 16.6 s — ABNORMAL HIGH (ref 11.4–15.2)

## 2023-07-05 MED ORDER — BRIMONIDINE TARTRATE-TIMOLOL 0.2-0.5 % OP SOLN: 1.0000 [drp] | Freq: Two times a day (BID) | OPHTHALMIC | Status: DC

## 2023-07-05 MED ORDER — ENALAPRIL MALEATE 10 MG PO TABS: 10.0000 mg | ORAL_TABLET | ORAL | Status: DC

## 2023-07-05 MED ORDER — LACTATED RINGERS IV SOLN: INTRAVENOUS | Status: AC

## 2023-07-05 MED ORDER — INSULIN GLARGINE-YFGN 100 UNIT/ML ~~LOC~~ SOLN
10.0000 [IU] | Freq: Every day | SUBCUTANEOUS | Status: DC
  Administered 2023-07-05: 10 [IU] via SUBCUTANEOUS
  Filled 2023-07-05 (×2): qty 0.1

## 2023-07-05 MED ORDER — LACTATED RINGERS IV BOLUS
500.0000 mL | Freq: Once | INTRAVENOUS | Status: AC
  Administered 2023-07-05: 500 mL via INTRAVENOUS

## 2023-07-05 MED ORDER — ENOXAPARIN SODIUM 40 MG/0.4ML IJ SOSY: 40.0000 mg | PREFILLED_SYRINGE | INTRAMUSCULAR | Status: DC

## 2023-07-05 MED ORDER — SENNA 8.6 MG PO TABS: 1.0000 | ORAL_TABLET | Freq: Every evening | ORAL | Status: DC | PRN

## 2023-07-05 MED ORDER — ONDANSETRON HCL 4 MG/2ML IJ SOLN
4.0000 mg | Freq: Four times a day (QID) | INTRAMUSCULAR | Status: DC | PRN
  Administered 2023-07-05: 4 mg via INTRAVENOUS
  Filled 2023-07-05: qty 2

## 2023-07-05 MED ORDER — INSULIN ASPART 100 UNIT/ML IJ SOLN
0.0000 [IU] | Freq: Three times a day (TID) | INTRAMUSCULAR | Status: DC
  Administered 2023-07-05: 2 [IU] via SUBCUTANEOUS
  Administered 2023-07-06: 3 [IU] via SUBCUTANEOUS
  Filled 2023-07-05 (×2): qty 1

## 2023-07-05 MED ORDER — CARVEDILOL 3.125 MG PO TABS
3.1250 mg | ORAL_TABLET | Freq: Two times a day (BID) | ORAL | Status: DC
  Administered 2023-07-05 – 2023-07-06 (×2): 3.125 mg via ORAL
  Filled 2023-07-05 (×2): qty 1

## 2023-07-05 MED ORDER — ATORVASTATIN CALCIUM 80 MG PO TABS
80.0000 mg | ORAL_TABLET | Freq: Every day | ORAL | Status: DC
  Administered 2023-07-06: 80 mg via ORAL
  Filled 2023-07-05: qty 4
  Filled 2023-07-05: qty 1

## 2023-07-05 MED ORDER — SODIUM CHLORIDE 0.9 % IV SOLN
2.0000 g | INTRAVENOUS | Status: DC
  Administered 2023-07-05: 2 g via INTRAVENOUS
  Filled 2023-07-05 (×2): qty 20

## 2023-07-05 MED ORDER — SODIUM CHLORIDE 0.9 % IV SOLN
500.0000 mg | INTRAVENOUS | Status: DC
  Administered 2023-07-05: 500 mg via INTRAVENOUS
  Filled 2023-07-05 (×2): qty 5

## 2023-07-05 MED ORDER — ENOXAPARIN SODIUM 60 MG/0.6ML IJ SOSY
0.5000 mg/kg | PREFILLED_SYRINGE | INTRAMUSCULAR | Status: DC
  Administered 2023-07-05: 55 mg via SUBCUTANEOUS
  Filled 2023-07-05: qty 0.6

## 2023-07-05 MED ORDER — ASPIRIN 81 MG PO TBEC
81.0000 mg | DELAYED_RELEASE_TABLET | Freq: Every day | ORAL | Status: DC
  Administered 2023-07-06: 81 mg via ORAL
  Filled 2023-07-05: qty 1

## 2023-07-05 MED ORDER — LACTATED RINGERS IV SOLN: INTRAVENOUS | Status: DC

## 2023-07-05 MED ORDER — ACETAMINOPHEN 325 MG PO TABS: 650.0000 mg | ORAL_TABLET | Freq: Four times a day (QID) | ORAL | Status: DC | PRN

## 2023-07-05 MED ORDER — HYDROCOD POLI-CHLORPHE POLI ER 10-8 MG/5ML PO SUER: 5.0000 mL | Freq: Two times a day (BID) | ORAL | Status: DC | PRN

## 2023-07-05 NOTE — H&P (Addendum)
History and Physical    Patient: Alexander [name] AYT:[id number] DOB: [birth year] DOA: 07/05/2023 DOS: the patient was seen and examined on 07/05/2023 PCP: Myrene Buddy, NP  Patient coming from: Home  Chief Complaint:  Chief Complaint  Patient presents with   Fever   HPI: Alexander [name] is a 72 y.o. male with medical history significant of CAD s/p CABG, HFrEF with last EF of 30-35%, type 2 diabetes complicated by diabetic retinopathy, CVA with residual right-sided deficits, hypertension, hyperlipidemia, who presents to the ED due to fever.  [name] states that for the last 4 days, he has been experiencing intermittent fevers, a productive cough with congestion in addition to poor appetite and generalized malaise.  He endorses nausea with nonbilious, nonbloody vomiting.  He has been unable to tolerate any p.o. intake in about 2 days.  When he is lying down, he can feel rumbling on the left side of his chest and he was worried for pneumonia.  He denies any orthopnea, or shortness of breath with exertion or at rest.  He denies any chest pain or palpitations.  ED course: On arrival to the ED, patient was normotensive at 130/81 with heart rate of 104.  He was saturating at 91% on room air but desaturated to 88% was placed on 2 L.  Heart rate ranged between 104 and 107.  He was afebrile at 99.3. Initial workup notable for WBC 12.1, sodium of 134, bicarb 17 with anion gap of 17, glucose 192, creatinine 0.83 with GFR above 60.  Procalcitonin elevated 0.24.  Lactic acid borderline at 2.0.  INR 1.3.  Urinalysis with negative leukocytes, nitrites and bacteria.  Chest x-ray was obtained with left lower lobe opacity.  Patient was started on ceftriaxone and Rocephin.  TRH contacted for admission.  Review of Systems: As mentioned in the history of present illness. All other systems reviewed and are negative.  Past Medical History:  Diagnosis Date   Anginal pain (HCC)    CHF (congestive heart failure)  (HCC)    Coronary artery disease    Diabetes mellitus without complication (HCC)    Type 2   GERD (gastroesophageal reflux disease)    History of kidney stones    Hypertension    Stroke (HCC)    mini, tingling right hand   Past Surgical History:  Procedure Laterality Date   CARDIAC CATHETERIZATION Left 08/09/2016   Procedure: Left Heart Cath and Coronary Angiography;  Surgeon: Dalia Heading, MD;  Location: [facility] INVASIVE CV LAB;  Service: Cardiovascular;  Laterality: Left;   CATARACT EXTRACTION W/PHACO Right 02/06/2023   Procedure: CATARACT EXTRACTION PHACO AND INTRAOCULAR LENS PLACEMENT (IOC) AHMED TUBE SHUNT W/ TUTOPLAST RIGHT DIABETIC  3.94   00:42.6;  Surgeon: Nevada Crane, MD;  Location: [facility];  Service: Ophthalmology;  Laterality: Right;   COLONOSCOPY WITH PROPOFOL N/A 02/02/2021   Procedure: COLONOSCOPY WITH PROPOFOL;  Surgeon: Regis Bill, MD;  Location: [facility] ENDOSCOPY;  Service: Endoscopy;  Laterality: N/A;   CORONARY ARTERY BYPASS GRAFT     2017 x5   Social History:  reports that he has never smoked. He has never used smokeless tobacco. He reports that he does not drink alcohol and does not use drugs.  No Known Allergies  History reviewed. No pertinent family history.  Prior to Admission medications   Medication Sig Start Date End Date Taking? Authorizing Provider  aspirin EC 81 MG tablet Take 81 mg by mouth daily.    [provider]  atorvastatin (LIPITOR) 80 MG tablet Take 80 mg by mouth daily. 02/04/19   [provider]  carvedilol (COREG) 3.125 MG tablet Take 3.125 mg by mouth 2 (two) times daily with a meal.    [provider]  empagliflozin (JARDIANCE) 25 MG TABS tablet Take 25 mg by mouth daily. 12/27/18   [provider]  enalapril (VASOTEC) 10 MG tablet Take 10 mg by mouth every other day.    [provider]  furosemide (LASIX) 80 MG tablet Take 80 mg by mouth 2 (two) times a day. 08/25/16    [provider]  hydrochlorothiazide (HYDRODIURIL) 12.5 MG tablet Take 12.5 mg by mouth daily. 02/20/19   [provider]  insulin NPH-regular Human (NOVOLIN 70/30) (70-30) 100 UNIT/ML injection Inject 28 Units into the skin daily with breakfast. 24 units in the evening with dinner    [provider]  Semaglutide (OZEMPIC, 1 MG/DOSE, [place]) Inject 0.75 mLs into the skin once a week. Due on Thursday    [provider]  senna (SENOKOT) 8.6 MG TABS tablet Take 1 tablet (8.6 mg total) by mouth at bedtime as needed for mild constipation. 07/11/21   Nita Sickle, MD    Physical Exam: Vitals:   07/05/23 1115 07/05/23 1130 07/05/23 1215 07/05/23 1230  BP: 135/68 (!) 153/78 131/61 124/77  Pulse: (!) 105 (!) 105 (!) 107 (!) 102  Resp: 17 (!) 27 (!) 28 (!) 36  Temp:      TempSrc:      SpO2: 98% 94% 91% 93%  Weight:      Height:       Physical Exam Vitals and nursing note reviewed.  Constitutional:      General: He is not in acute distress.    Appearance: He is obese.  HENT:     Head: Normocephalic and atraumatic.     Mouth/Throat:     Mouth: Mucous membranes are dry.     Pharynx: Oropharynx is clear.  Eyes:     Conjunctiva/sclera: Conjunctivae normal.     Comments: Pupils unequal, right > left. Chronic  Cardiovascular:     Rate and Rhythm: Regular rhythm. Tachycardia present.     Heart sounds: No murmur heard.    No gallop.  Pulmonary:     Effort: Pulmonary effort is normal. Tachypnea present. No accessory muscle usage or respiratory distress.     Breath sounds: Examination of the left-middle field reveals rhonchi. Examination of the left-lower field reveals rhonchi. Rhonchi present. No decreased breath sounds, wheezing or rales.  Abdominal:     General: Bowel sounds are normal. There is no distension.     Palpations: Abdomen is soft.     Tenderness: There is no abdominal tenderness. There is no guarding.  Musculoskeletal:     Comments: +1 pitting  edema bilaterally  Skin:    General: Skin is warm and dry.  Neurological:     Mental Status: He is alert and oriented to person, place, and time. Mental status is at baseline.  Psychiatric:        Mood and Affect: Mood normal.        Behavior: Behavior normal.    Data Reviewed: CBC with WBC of 12.1, hemoglobin of 13.8, platelets of 167 CMP with sodium of 134, potassium 3.6, bicarb 17, anion gap 17, creatinine 0.83, AST 23, ALT 29 GFR above 60 Lactic acid 2.1 Procalcitonin 0.24 COVID-19, and influenza PCR negative INR 1.3 Urinalysis with glucosuria, ketonuria, proteinuria, increased [place]  gravity  EKG personally reviewed.  Sinus rhythm with rate of 105.  Borderline first-degree AV block.  Borderline QTc prolongation at 491 ms.  No ST or T wave changes concerning for acute ischemia.  DG Chest 2 View  Result Date: 07/05/2023 CLINICAL DATA:  Cough, fever, rales left lower lung. EXAM: CHEST - 2 VIEW COMPARISON:  AP chest 03/16/2019, chest two views 01/25/2008 FINDINGS: Status post median sternotomy. Cardiac silhouette is again mildly enlarged. Mediastinal contours are within normal limits. On frontal view there appears to be a left retrocardiac opacity, and there is mild heterogeneous opacification overlying the posterior lower lung on lateral view. No definite pleural effusion. No pneumothorax. Moderate multilevel degenerative disc changes of the thoracic spine. IMPRESSION: Left lower lung opacity, which may represent atelectasis or pneumonia. Electronically Signed   By: Neita Garnet M.D.   On: 07/05/2023 11:52    Results are pending, will review when available.  Assessment and Plan:  * CAP (community acquired pneumonia) Patient is presenting with 4-day history of fever, congestion and productive cough found to have a left lower lobe opacity on chest x-ray consistent with community-acquired pneumonia. Patient's oxygen saturating > 95% on room air for the entirety of conversation and  examination.   Although patient meets SIRS criteria, there is no evidence of life-threatening organ dysfunction to suggest sepsis (per 2016 SEPSIS-3 guidelines).  Patient is certainly at high risk though.   - Telemetry monitoring - Continue Ceftriaxone and azithromycin - Blood cultures ordered - Strep pneumo and Legionella antigens - Tussionex as needed for cough  Metabolic acidosis Anion gap metabolic acidosis noted on admission with minimally elevated lactic acid.  Given nausea/vomiting with no PO intake in 2 days, likely starvation ketosis.  - IV fluids as ordered  CAD (coronary artery disease) History of CAD s/p CABG.  No chest pain reported at this time.  - Continue home aspirin and statin  Diabetes mellitus (HCC) History of type 2 diabetes with last A1c of 7.1% approximately 2 months ago, however complicated by polyneuropathy and retinopathy with microalbuminuria.  - Hold home antiglycemic agents - SSI, moderate - Semglee 10 units at bedtime  Chronic systolic heart failure [facility]) Patient has a history of ischemic cardiomyopathy with last EF of 40% in March 2024.  Patient appears hypovolemic at this time in the setting of underlying infection.  Lower extremity edema is more likely venous stasis in the setting of being sedentary.  - Hold home diuretics - Continue home Carvedilol - Resume when able - Daily weights  Hypertension - No longer on Enalapril - Hold home thiazide in the setting of dehydration  Advance Care Planning:   Code Status: Full Code verified by patient with family at bedside  Consults: None  Family Communication: Patient's wife and daughter updated at bedside  Severity of Illness: The appropriate patient status for this patient is OBSERVATION. Observation status is judged to be reasonable and necessary in order to provide the required intensity of service to ensure the patient's safety. The patient's presenting symptoms, physical exam findings, and  initial radiographic and laboratory data in the context of their medical condition is felt to place them at decreased risk for further clinical deterioration. Furthermore, it is anticipated that the patient will be medically stable for discharge from the hospital within 2 midnights of admission.   Author: Verdene Lennert, MD 07/05/2023 1:09 PM  For on call review [email or web address].

## 2023-07-05 NOTE — Assessment & Plan Note (Addendum)
-   No longer on Enalapril - Hold home thiazide in the setting of dehydration

## 2023-07-05 NOTE — Assessment & Plan Note (Addendum)
Anion gap metabolic acidosis noted on admission with minimally elevated lactic acid.  Given nausea/vomiting with no PO intake in 2 days, likely starvation ketosis. -Resolved with IV fluid

## 2023-07-05 NOTE — ED Notes (Signed)
Pt vomited after eating. Gown and blankets changed. Zofran given.

## 2023-07-05 NOTE — ED Provider Notes (Signed)
[facility] Provider Note    Event Date/Time   First MD Initiated Contact with Patient 07/05/23 [phone]     (approximate)   History   Fever   HPI  Alexander [name] is a 72 y.o. male on review of primary care note from July of this year has a history of type 2 diabetes, stable chronic coronary disease previous bypass and reduced EF,    4 days of congestion, productive cough, chills fatigue.  Some nausea and vomited last night and once this morning.  When he got up from bed he started feeling lightheaded and actually had to slowly lower himself to the floor but did not fall.  EMS was called as he was too weak to walk this morning  He feels quite fatigued  He also works frequently doing Aeronautical engineer.  He has abrasions to both of his knees from about 2 weeks ago they have been slow to heal.  Some slight drainage from the right knee abrasion  Physical Exam   Triage Vital Signs: ED Triage Vitals  Encounter Vitals Group     BP 07/05/23 0925 130/81     Systolic BP Percentile --      Diastolic BP Percentile --      Pulse Rate 07/05/23 0925 (!) 105     Resp 07/05/23 0925 18     Temp 07/05/23 0925 99.3 F (37.4 C)     Temp Source 07/05/23 0925 Oral     SpO2 07/05/23 0925 91 %     Weight 07/05/23 0924 246 lb (111.6 kg)     Height 07/05/23 0924 6' (1.829 m)     Head Circumference --      Peak Flow --      Pain Score 07/05/23 0923 0     Pain Loc --      Pain Education --      Exclude from Growth Chart --     Most recent vital signs: Vitals:   07/05/23 1115 07/05/23 1130  BP: 135/68 (!) 153/78  Pulse: (!) 105 (!) 105  Resp: 17 (!) 27  Temp:    SpO2: 98% 94%     General: Awake, no distress.  Appears ill, somewhat fatigued.  No acute distress but does appear ill. CV:  Good peripheral perfusion.  Regular, slight tachycardia. Resp:  Normal effort.  He has oxygen saturation 88% when speaking on room air, placed on 2 L nasal cannula.  Crackles noted in the  left lower lung.  No wheezing or rails and remaining lobes.  No wheezing over the left lower lung.  Frequent cough Abd:  No distention.  Soft nontender nondistended throughout.  Despite the vomiting he has had he reports he is not had any abdominal pain with Other:  Approximately 2 x 2 cm abrasions to the anterior knees bilaterally, the left appears to be healing well, the right shows very small area of erythema surrounding its borders and tiny amount of purulent drainage.   ED Results / Procedures / Treatments   Labs (all labs ordered are listed, but only abnormal results are displayed) Labs Reviewed  LACTIC ACID, PLASMA - Abnormal; Notable for the following components:      Result Value   Lactic Acid, Venous 2.0 (*)    All other components within normal limits  COMPREHENSIVE METABOLIC PANEL - Abnormal; Notable for the following components:   Sodium 134 (*)    CO2 17 (*)    Glucose, Bld 192 (*)  Calcium 8.7 (*)    Total Bilirubin 3.1 (*)    Anion gap 17 (*)    All other components within normal limits  CBC WITH DIFFERENTIAL/PLATELET - Abnormal; Notable for the following components:   WBC 12.1 (*)    Neutro Abs 9.9 (*)    Monocytes Absolute 1.3 (*)    All other components within normal limits  PROTIME-INR - Abnormal; Notable for the following components:   Prothrombin Time 16.6 (*)    INR 1.3 (*)    All other components within normal limits  URINALYSIS, W/ REFLEX TO CULTURE (INFECTION SUSPECTED) - Abnormal; Notable for the following components:   Color, Urine YELLOW (*)    APPearance CLEAR (*)    Specific Gravity, Urine 1.032 (*)    Glucose, UA >=500 (*)    Ketones, ur 80 (*)    Protein, ur 100 (*)    All other components within normal limits  RESP PANEL BY RT-PCR (FLU A&B, COVID) ARPGX2  CULTURE, BLOOD (ROUTINE X 2)  CULTURE, BLOOD (ROUTINE X 2)  APTT  PROCALCITONIN  LACTIC ACID, PLASMA     EKG  Interpreted by me at 930 heart rate 105 QRS 100 QTc 490 sinus  tachycardia.  Probable old anteroseptal infarct.  Patient does have a history of previous cardiac disease   RADIOLOGY  Chest x-ray interpreted by me is concerning for left lower lobe pneumonia or infiltrate.      PROCEDURES:  Critical Care performed: Yes, see critical care procedure note(s)  CRITICAL CARE Performed by: Sharyn Creamer   Total critical care time: 30 minutes  Critical care time was exclusive of separately billable procedures and treating other patients.  Critical care was necessary to treat or prevent imminent or life-threatening deterioration.  Critical care was time spent personally by me on the following activities: development of treatment plan with patient and/or surrogate as well as nursing, discussions with consultants, evaluation of patient's response to treatment, examination of patient, obtaining history from patient or surrogate, ordering and performing treatments and interventions, ordering and review of laboratory studies, ordering and review of radiographic studies, pulse oximetry and re-evaluation of patient's condition.   Procedures   MEDICATIONS ORDERED IN ED: Medications  lactated ringers infusion ( Intravenous Rate/Dose Change 07/05/23 1209)  cefTRIAXone (ROCEPHIN) 2 g in sodium chloride 0.9 % 100 mL IVPB (2 g Intravenous New Bag/Given 07/05/23 1212)  azithromycin (ZITHROMAX) 500 mg in sodium chloride 0.9 % 250 mL IVPB (has no administration in time range)  lactated ringers bolus 500 mL (0 mLs Intravenous Stopped 07/05/23 1212)     IMPRESSION / MDM / ASSESSMENT AND PLAN / ED COURSE  I reviewed the triage vital signs and the nursing notes.                              Differential diagnosis includes, but is not limited to, pneumonia based on fevers cough productive and also crackles in the left lung base clinically he is suspicious for pneumonia awaiting x-ray and further testing.  Additional findings considerations viral illness, cellulitic now  potentially developing sepsis from right knee abrasion, etc. considered.  He has no acute intra-abdominal symptoms but does have some vomiting I suspect this is secondary more to an upper respiratory pulmonary or other virus or other infection.  He does not have clear evidence of an acute intra-abdominal complaint  He has no associated chest pain.  He is awake alert oriented but  is notably tachycardic, reports having fevers at home.  Mild hypoxia with oxygen requirement.  Patient's presentation is most consistent with acute presentation with potential threat to life or bodily function.   The patient is on the cardiac monitor to evaluate for evidence of arrhythmia and/or significant heart rate changes.   Clinical Course as of 07/05/23 1221  Wed Jul 05, 2023  1103 CO2(!): 17 [MQ]  1103 Total Bilirubin(!): 3.1 [MQ]  1103 WBC(!): 12.1 [MQ]  1103 INR(!): 1.3 Labs interpreted as leukocytosis.  Bilirubin greater than 3, concerning for potential systemic cause.  LFTs normal.  No associated abdominal symptoms of vomiting is experienced.  Additionally INR minimally elevated.  Anion gap 17.  Reduced CO2.  Currently awaiting lactic acid. [MQ]  1220 Accepted in admission by Dr. Huel Cote [MQ]    Clinical Course User Index [MQ] Sharyn Creamer, MD   ----------------------------------------- 11:48 AM on 07/05/2023 ----------------------------------------- Patient meets sepsis criteria.  Clinical findings consistent with bacterial pneumonia.  No recent hospitalizations or surgeries.  No noted risk factors to suggest high risk for healthcare associated infection  Discussed with patient he is comfortable with plan for admission.  FINAL CLINICAL IMPRESSION(S) / ED DIAGNOSES   Final diagnoses:  Sepsis, due to unspecified organism, unspecified whether acute organ dysfunction present (HCC)  Pneumonia of left lower lobe due to infectious organism     Rx / DC Orders   ED Discharge Orders     None         Note:  This document was prepared using Dragon voice recognition software and may include unintentional dictation errors.   Sharyn Creamer, MD 07/05/23 [phone]

## 2023-07-05 NOTE — Assessment & Plan Note (Addendum)
Patient has a history of ischemic cardiomyopathy with last EF of 40% in March 2024.  Patient appears hypovolemic at this time in the setting of underlying infection.  Lower extremity edema is more likely venous stasis in the setting of being sedentary.  - Hold home diuretics - Continue home Carvedilol - Resume when able - Daily weights

## 2023-07-05 NOTE — Assessment & Plan Note (Addendum)
Patient is presenting with 4-day history of fever, congestion and productive cough found to have a left lower lobe opacity on chest x-ray consistent with community-acquired pneumonia. Patient's oxygen saturating > 95% on room air for the entirety of conversation and examination.   Although patient meets sepsis criteria with leukocytosis and mild tachycardia which is improving.  No endorgan damage.  Preliminary blood cultures negative.  Strep pneumo negative, mildly elevated procalcitonin.  Initially received ceftriaxone and Zithromax.  Does not require any oxygen.  Appears at baseline -Switch to Augmentin and Zithromax to complete a total of 5-day course - Supportive care

## 2023-07-05 NOTE — ED Triage Notes (Signed)
Patient to ED via [facility] from home for fever, chills and vomiting "for a few days." Hx of stroke with right sided deficients. Given 4mg  of Zofran by EMS.

## 2023-07-05 NOTE — Assessment & Plan Note (Signed)
History of CAD s/p CABG.  No chest pain reported at this time.  - Continue home aspirin and statin

## 2023-07-05 NOTE — ED Notes (Signed)
Hospitalist at bedside. Pt to be moved to 34 when finished.

## 2023-07-05 NOTE — Progress Notes (Signed)
Elink following for sespsis protocol. 

## 2023-07-05 NOTE — Consult Note (Signed)
CODE SEPSIS - PHARMACY COMMUNICATION  **Broad-spectrum antimicrobials should be administered within one hour of sepsis diagnosis**  Time Code Sepsis call or page was received: 1143  Antibiotics ordered: Ceftriaxone, azithromycin  Time of first antibiotic administration: 1212  Additional action taken by pharmacy: N/A  If necessary, name of provider/nurse contacted: N/A    Will M. Dareen Piano, PharmD Clinical Pharmacist 07/05/2023 11:57 AM

## 2023-07-05 NOTE — Assessment & Plan Note (Addendum)
History of type 2 diabetes with last A1c of 7.1% approximately 2 months ago, however complicated by polyneuropathy and retinopathy with microalbuminuria.  - Hold home antiglycemic agents - SSI, moderate - Semglee 10 units at bedtime

## 2023-07-06 DIAGNOSIS — E872 Acidosis, unspecified: Secondary | ICD-10-CM | POA: Diagnosis not present

## 2023-07-06 DIAGNOSIS — I5022 Chronic systolic (congestive) heart failure: Secondary | ICD-10-CM | POA: Diagnosis not present

## 2023-07-06 DIAGNOSIS — J189 Pneumonia, unspecified organism: Secondary | ICD-10-CM | POA: Diagnosis not present

## 2023-07-06 DIAGNOSIS — E11319 Type 2 diabetes mellitus with unspecified diabetic retinopathy without macular edema: Secondary | ICD-10-CM | POA: Diagnosis not present

## 2023-07-06 LAB — CBC WITH DIFFERENTIAL/PLATELET
Abs Immature Granulocytes: 0.03 10*3/uL (ref 0.00–0.07)
Basophils Absolute: 0 10*3/uL (ref 0.0–0.1)
Basophils Relative: 0 %
Eosinophils Absolute: 0 10*3/uL (ref 0.0–0.5)
Eosinophils Relative: 0 %
HCT: 41.3 % (ref 39.0–52.0)
Hemoglobin: 13.3 g/dL (ref 13.0–17.0)
Immature Granulocytes: 0 %
Lymphocytes Relative: 12 %
Lymphs Abs: 1.3 10*3/uL (ref 0.7–4.0)
MCH: 30.7 pg (ref 26.0–34.0)
MCHC: 32.2 g/dL (ref 30.0–36.0)
MCV: 95.4 fL (ref 80.0–100.0)
Monocytes Absolute: 1.6 10*3/uL — ABNORMAL HIGH (ref 0.1–1.0)
Monocytes Relative: 15 %
Neutro Abs: 7.9 10*3/uL — ABNORMAL HIGH (ref 1.7–7.7)
Neutrophils Relative %: 73 %
Platelets: 149 10*3/uL — ABNORMAL LOW (ref 150–400)
RBC: 4.33 MIL/uL (ref 4.22–5.81)
RDW: 14.3 % (ref 11.5–15.5)
WBC: 10.9 10*3/uL — ABNORMAL HIGH (ref 4.0–10.5)
nRBC: 0 % (ref 0.0–0.2)

## 2023-07-06 LAB — BASIC METABOLIC PANEL
Anion gap: 12 (ref 5–15)
BUN: 28 mg/dL — ABNORMAL HIGH (ref 8–23)
CO2: 22 mmol/L (ref 22–32)
Calcium: 8.5 mg/dL — ABNORMAL LOW (ref 8.9–10.3)
Chloride: 101 mmol/L (ref 98–111)
Creatinine, Ser: 0.99 mg/dL (ref 0.61–1.24)
GFR, Estimated: >60 mL/min (ref >60)
Glucose, Bld: 159 mg/dL — ABNORMAL HIGH (ref 70–99)
Potassium: 3.9 mmol/L (ref 3.5–5.1)
Sodium: 135 mmol/L (ref 135–145)

## 2023-07-06 LAB — GLUCOSE, CAPILLARY: Glucose-Capillary: 169 mg/dL — ABNORMAL HIGH (ref 70–99)

## 2023-07-06 LAB — HIV ANTIBODY (ROUTINE TESTING W REFLEX): HIV Screen 4th Generation wRfx: NONREACTIVE

## 2023-07-06 MED ORDER — AZITHROMYCIN 500 MG PO TABS: 500.0000 mg | ORAL_TABLET | Freq: Every day | ORAL | 0 refills | Status: AC

## 2023-07-06 MED ORDER — AMOXICILLIN-POT CLAVULANATE 875-125 MG PO TABS: 1.0000 | ORAL_TABLET | Freq: Two times a day (BID) | ORAL | 0 refills | Status: AC

## 2023-07-06 MED ORDER — SENNA 8.6 MG PO TABS: 1.0000 | ORAL_TABLET | Freq: Every evening | ORAL | 0 refills | Status: DC | PRN

## 2023-07-06 MED ORDER — DM-GUAIFENESIN ER 30-600 MG PO TB12: 1.0000 | ORAL_TABLET | Freq: Two times a day (BID) | ORAL | 0 refills | Status: DC | PRN

## 2023-07-06 MED ORDER — BENZONATATE 100 MG PO CAPS: 100.0000 mg | ORAL_CAPSULE | Freq: Three times a day (TID) | ORAL | 0 refills | Status: DC | PRN

## 2023-07-06 NOTE — TOC Progression Note (Signed)
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,  He drives and has transportation with his wife home He is on room air    Expected Discharge Plan: Home/Self Care Barriers to Discharge: Continued Medical Work up  Expected Discharge Plan and Services   Discharge Planning Services: CM Consult   Living arrangements for the past 2 months: Single Family Home                   DME Agency: NA       HH Arranged: NA           Social Determinants of Health (SDOH) Interventions SDOH Screenings   Food Insecurity: No Food Insecurity (07/05/2023)  Housing: Low Risk  (07/05/2023)  Transportation Needs: No Transportation Needs (07/05/2023)  Utilities: Not At Risk (07/05/2023)  Tobacco Use: Low Risk  (07/05/2023)    Readmission Risk Interventions     No data to display

## 2023-07-06 NOTE — Plan of Care (Signed)
  Problem: Nutritional: Goal: Maintenance of adequate nutrition will improve Outcome: Progressing   

## 2023-07-06 NOTE — Care Management Obs Status (Signed)
MEDICARE OBSERVATION STATUS NOTIFICATION   Patient Details  Name: Alexander [name] MRN: [id number] Date of Birth: 1951-05-21   Medicare Observation Status Notification Given:  Yes    Marlowe Sax, RN 07/06/2023, 9:49 AM

## 2023-07-06 NOTE — Hospital Course (Addendum)
Taken from H&P.   Alexander [name] is a 72 y.o. 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  Had an episode of nonbilious, nonbloody vomiting.  On arrival to ED patient was mildly tachycardic at 104, initially saturating 91% on room air but later desaturated to 88% and was placed on 2 L, afebrile.  Initial lab pertinent for leukocytosis at 12.1, bicarb 17, anion gap 17, glucose 192.  Procalcitonin 0.24 and borderline lactic acid at 2.  UA negative for UTI.  Chest x-ray with left lower lobe opacity.  Patient was started on ceftriaxone and Zithromax.  9/12: Vital stable, on room air.  Improving leukocytosis send rest of the labs stable.  Strep pneumo antigen negative, preliminary blood cultures negative.  LA 1.8.  Patient appears at baseline.  He is being discharged on Augmentin and Zithromax to complete a 5-day course for a small left lower lobe pneumonia.  Patient need to follow-up with primary care provider and repeat chest imaging in 3 to 4 weeks to see the resolution.  Patient will continue on current medications and need to have a close follow-up with his providers for further recommendations.

## 2023-07-06 NOTE — Discharge Summary (Signed)
Physician Discharge Summary   Patient: Alexander [name] MRN: [id number] DOB: July 19, 1951  Admit date:     07/05/2023  Discharge date: 07/06/23  Discharge Physician: Arnetha Courser   PCP: Myrene Buddy, NP   Recommendations at discharge:  Please obtain CBC and BMP in 1 week Please repeat chest x-ray in 3 to 4 weeks Please ensure the completion of antibiotic Follow-up with primary care provider within a week  Discharge Diagnoses: Principal Problem:   CAP (community acquired pneumonia) Active Problems:   Metabolic acidosis   Hypertension   Chronic systolic heart failure (HCC)   Diabetes mellitus (HCC)   CAD (coronary artery disease)   Hospital Course: Taken from H&P.   Alexander [name] is a 72 y.o. 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  Had an episode of nonbilious, nonbloody vomiting.  On arrival to ED patient was mildly tachycardic at 104, initially saturating 91% on room air but later desaturated to 88% and was placed on 2 L, afebrile.  Initial lab pertinent for leukocytosis at 12.1, bicarb 17, anion gap 17, glucose 192.  Procalcitonin 0.24 and borderline lactic acid at 2.  UA negative for UTI.  Chest x-ray with left lower lobe opacity.  Patient was started on ceftriaxone and Zithromax.  9/12: Vital stable, on room air.  Improving leukocytosis send rest of the labs stable.  Strep pneumo antigen negative, preliminary blood cultures negative.  LA 1.8.  Patient appears at baseline.  He is being discharged on Augmentin and Zithromax to complete a 5-day course for a small left lower lobe pneumonia.  Patient need to follow-up with primary care provider and repeat chest imaging in 3 to 4 weeks to  see the resolution.  Patient will continue on current medications and need to have a close follow-up with his providers for further recommendations.    Assessment and Plan: * CAP (community acquired pneumonia) Patient is presenting with 4-day history of fever, congestion and productive cough found to have a left lower lobe opacity on chest x-ray consistent with community-acquired pneumonia. Patient's oxygen saturating > 95% on room air for the entirety of conversation and examination.   Although patient meets sepsis criteria with leukocytosis and mild tachycardia which is improving.  No endorgan damage.  Preliminary blood cultures negative.  Strep pneumo negative, mildly elevated procalcitonin.  Initially received ceftriaxone and Zithromax.  Does not require any oxygen.  Appears at baseline -Switch to Augmentin and Zithromax to complete a total of 5-day course - Supportive care  Metabolic acidosis Anion gap metabolic acidosis noted on admission with minimally elevated lactic acid.  Given nausea/vomiting with no PO intake in 2 days, likely starvation ketosis. -Resolved with IV fluid  CAD (coronary artery disease) History of CAD s/p CABG.  No chest pain reported at this time.  - Continue home aspirin and statin  Diabetes mellitus (HCC) History of type 2 diabetes with last A1c of 7.1% approximately 2 months ago, however complicated by polyneuropathy and retinopathy with microalbuminuria.  - Hold home antiglycemic agents - SSI, moderate - Semglee 10 units at bedtime  Chronic systolic heart failure [facility]) Patient has a history of ischemic cardiomyopathy with last EF of 40% in March 2024.  Patient appears hypovolemic at this time in the setting of  underlying infection.  Lower extremity edema is more likely venous stasis in the setting of being sedentary. -Continue home medications  Hypertension - No longer on Enalapril - Hold home thiazide in the setting of dehydration-patient can resume  on discharge  Consultants: None Procedures performed: None Disposition: Home Diet recommendation:  Discharge Diet Orders (From admission, onward)     Start     Ordered   07/06/23 0000  Diet - low sodium heart healthy        07/06/23 1013           Cardiac and Carb modified diet DISCHARGE MEDICATION: Allergies as of 07/06/2023   No Known Allergies      Medication List     STOP taking these medications    atropine 1 % ophthalmic solution   brimonidine-timolol 0.2-0.5 % ophthalmic solution Commonly known as: COMBIGAN       TAKE these medications    acetaminophen 500 MG tablet Commonly known as: TYLENOL Take 1,000 mg by mouth every 6 (six) hours as needed for mild pain or headache.   amoxicillin-clavulanate 875-125 MG tablet Commonly known as: AUGMENTIN Take 1 tablet by mouth 2 (two) times daily for 4 days.   aspirin EC 81 MG tablet Take 81 mg by mouth daily.   atorvastatin 80 MG tablet Commonly known as: LIPITOR Take 80 mg by mouth daily.   azithromycin 500 MG tablet Commonly known as: Zithromax Take 1 tablet (500 mg total) by mouth daily for 3 days.   benzonatate 100 MG capsule Commonly known as: Tessalon Perles Take 1 capsule (100 mg total) by mouth 3 (three) times daily as needed for cough.   carvedilol 3.125 MG tablet Commonly known as: COREG Take 3.125 mg by mouth daily with supper.   dextromethorphan-guaiFENesin 30-600 MG 12hr tablet Commonly known as: MUCINEX DM Take 1 tablet by mouth 2 (two) times daily as needed for cough.   empagliflozin 25 MG Tabs tablet Commonly known as: JARDIANCE Take 25 mg by mouth daily.   insulin NPH-regular Human (70-30) 100 UNIT/ML injection Inject 25-30 Units into the skin 2 (two) times daily with a meal.   Ozempic (2 MG/DOSE) 8 MG/3ML Sopn Generic drug: Semaglutide (2 MG/DOSE) Inject 2 mg into the skin once a week. Tuesday   senna 8.6 MG Tabs tablet Commonly known as: SENOKOT Take 1 tablet (8.6 mg  total) by mouth at bedtime as needed for mild constipation.        Follow-up Information     Gauger, Hermenia Fiscal, NP. Schedule an appointment as soon as possible for a visit in 1 week(s).   Specialty: Internal Medicine Contact information: [address] [facility] - PRIMARY CARE [place] [address] [phone]                Discharge Exam: Ceasar Mons Weights   07/05/23 0924 07/05/23 2206  Weight: 111.6 kg 110.9 kg   General.  Obese gentleman, in no acute distress. Pulmonary.  Lungs clear bilaterally, normal respiratory effort. CV.  Regular rate and rhythm, no JVD, rub or murmur. Abdomen.  Soft, nontender, nondistended, BS positive. CNS.  Alert and oriented .  No focal neurologic deficit. Extremities.  No edema, no cyanosis, pulses intact and symmetrical. Psychiatry.  Judgment and insight appears normal.   Condition at discharge: stable  The results of significant diagnostics from this hospitalization (including imaging, microbiology, ancillary and laboratory) are listed below for reference.   Imaging Studies: DG Chest 2 View  Result Date: 07/05/2023  CLINICAL DATA:  Cough, fever, rales left lower lung. EXAM: CHEST - 2 VIEW COMPARISON:  AP chest 03/16/2019, chest two views 01/25/2008 FINDINGS: Status post median sternotomy. Cardiac silhouette is again mildly enlarged. Mediastinal contours are within normal limits. On frontal view there appears to be a left retrocardiac opacity, and there is mild heterogeneous opacification overlying the posterior lower lung on lateral view. No definite pleural effusion. No pneumothorax. Moderate multilevel degenerative disc changes of the thoracic spine. IMPRESSION: Left lower lung opacity, which may represent atelectasis or pneumonia. Electronically Signed   By: Neita Garnet M.D.   On: 07/05/2023 11:52    Microbiology: Results for orders placed or performed during the hospital encounter of 07/05/23  Resp Panel by RT-PCR  (Flu A&B, Covid) Anterior Nasal Swab     Status: None   Collection Time: 07/05/23  9:27 AM   Specimen: Anterior Nasal Swab  Result Value Ref Range Status   SARS Coronavirus 2 by RT PCR NEGATIVE NEGATIVE Final    Comment: (NOTE) SARS-CoV-2 target nucleic acids are NOT DETECTED.  The SARS-CoV-2 RNA is generally detectable in upper respiratory specimens during the acute phase of infection. The lowest concentration of SARS-CoV-2 viral copies this assay can detect is 138 copies/mL. A negative result does not preclude SARS-Cov-2 infection and should not be used as the sole basis for treatment or other patient management decisions. A negative result may occur with  improper specimen collection/handling, submission of specimen other than nasopharyngeal swab, presence of viral mutation(s) within the areas targeted by this assay, and inadequate number of viral copies(<138 copies/mL). A negative result must be combined with clinical observations, patient history, and epidemiological information. The expected result is Negative.  Fact Sheet for Patients:  BloggerCourse.com  Fact Sheet for Healthcare Providers:  SeriousBroker.it  This test is no t yet approved or cleared by the [place] FDA and  has been authorized for detection and/or diagnosis of SARS-CoV-2 by FDA under an Emergency Use Authorization (EUA). This EUA will remain  in effect (meaning this test can be used) for the duration of the COVID-19 declaration under Section 564(b)(1) of the Act, 21 U.S.C.section 360bbb-3(b)(1), unless the authorization is terminated  or revoked sooner.       Influenza A by PCR NEGATIVE NEGATIVE Final   Influenza B by PCR NEGATIVE NEGATIVE Final    Comment: (NOTE) The Xpert Xpress SARS-CoV-2/FLU/RSV plus assay is intended as an aid in the diagnosis of influenza from Nasopharyngeal swab specimens and should not be used as a sole basis for  treatment. Nasal washings and aspirates are unacceptable for Xpert Xpress SARS-CoV-2/FLU/RSV testing.  Fact Sheet for Patients: BloggerCourse.com  Fact Sheet for Healthcare Providers: SeriousBroker.it  This test is not yet approved or cleared by the [place] FDA and has been authorized for detection and/or diagnosis of SARS-CoV-2 by FDA under an Emergency Use Authorization (EUA). This EUA will remain in effect (meaning this test can be used) for the duration of the COVID-19 declaration under Section 564(b)(1) of the Act, 21 U.S.C. section 360bbb-3(b)(1), unless the authorization is terminated or revoked.  Performed at [facility], [address], [place], [place] [address]   Blood Culture (routine x 2)     Status: None (Preliminary result)   Collection Time: 07/05/23 10:44 AM   Specimen: BLOOD  Result Value Ref Range Status   Specimen Description BLOOD LEFT HAND  Final   Special Requests   Final    BOTTLES DRAWN AEROBIC AND ANAEROBIC Blood Culture  adequate volume   Culture   Final    NO GROWTH < 24 HOURS Performed at [facility], [address] [place], [place] [address]    Report Status PENDING  Incomplete  Blood Culture (routine x 2)     Status: None (Preliminary result)   Collection Time: 07/05/23 10:44 AM   Specimen: BLOOD  Result Value Ref Range Status   Specimen Description BLOOD RIGH HAND  Final   Special Requests   Final    BOTTLES DRAWN AEROBIC AND ANAEROBIC Blood Culture adequate volume   Culture   Final    NO GROWTH < 24 HOURS Performed at [facility], [address] [place], [place] [address]    Report Status PENDING  Incomplete    Labs: CBC: Recent Labs  Lab 07/05/23 0927 07/06/23 0424  WBC 12.1* 10.9*  NEUTROABS 9.9* 7.9*  HGB 13.8 13.3  HCT 42.7 41.3  MCV 95.1 95.4  PLT 167 149*   Basic Metabolic Panel: Recent Labs  Lab 07/05/23 0927 07/06/23 0424   NA 134* 135  K 3.6 3.9  CL 100 101  CO2 17* 22  GLUCOSE 192* 159*  BUN 21 28*  CREATININE 0.83 0.99  CALCIUM 8.7* 8.5*   Liver Function Tests: Recent Labs  Lab 07/05/23 0927  AST 23  ALT 29  ALKPHOS 73  BILITOT 3.1*  PROT 7.1  ALBUMIN 3.8   CBG: Recent Labs  Lab 07/05/23 1623 07/05/23 2132 07/06/23 0809  GLUCAP 149* 150* 169*    Discharge time spent: greater than 30 minutes.  This record has been created using Conservation officer, historic buildings. Errors have been sought and corrected,but may not always be located. Such creation errors do not reflect on the standard of care.   Signed: Arnetha Courser, MD [facility] 07/06/2023

## 2023-07-07 LAB — LEGIONELLA PNEUMOPHILA SEROGP 1 UR AG: L. pneumophila Serogp 1 Ur Ag: NEGATIVE

## 2023-07-10 LAB — CULTURE, BLOOD (ROUTINE X 2)
Culture: NO GROWTH
Culture: NO GROWTH
Special Requests: ADEQUATE
Special Requests: ADEQUATE

## 2023-07-24 ENCOUNTER — Other Ambulatory Visit: Payer: Self-pay

## 2023-07-24 ENCOUNTER — Emergency Department
Admission: EM | Admit: 2023-07-24 | Discharge: 2023-07-24 | Discharge status: Against Medical Advice | Payer: Medicare Other | Attending: Emergency Medicine | Admitting: Emergency Medicine

## 2023-07-24 DIAGNOSIS — R1110 Vomiting, unspecified: Secondary | ICD-10-CM | POA: Insufficient documentation

## 2023-07-24 DIAGNOSIS — R109 Unspecified abdominal pain: Secondary | ICD-10-CM | POA: Diagnosis not present

## 2023-07-24 DIAGNOSIS — Z5321 Procedure and treatment not carried out due to patient leaving prior to being seen by health care provider: Secondary | ICD-10-CM | POA: Diagnosis not present

## 2023-07-24 LAB — CBC
HCT: 47.5 % (ref 39.0–52.0)
Hemoglobin: 15 g/dL (ref 13.0–17.0)
MCH: 30.3 pg (ref 26.0–34.0)
MCHC: 31.6 g/dL (ref 30.0–36.0)
MCV: 96 fL (ref 80.0–100.0)
Platelets: 155 10*3/uL (ref 150–400)
RBC: 4.95 MIL/uL (ref 4.22–5.81)
RDW: 14.6 % (ref 11.5–15.5)
WBC: 13 10*3/uL — ABNORMAL HIGH (ref 4.0–10.5)
nRBC: 0 % (ref 0.0–0.2)

## 2023-07-24 LAB — LIPASE, BLOOD: Lipase: 20 U/L (ref 11–51)

## 2023-07-24 LAB — COMPREHENSIVE METABOLIC PANEL
ALT: 141 U/L — ABNORMAL HIGH (ref 0–44)
AST: 110 U/L — ABNORMAL HIGH (ref 15–41)
Albumin: 3.6 g/dL (ref 3.5–5.0)
Alkaline Phosphatase: 99 U/L (ref 38–126)
Anion gap: 15 (ref 5–15)
BUN: 24 mg/dL — ABNORMAL HIGH (ref 8–23)
CO2: 21 mmol/L — ABNORMAL LOW (ref 22–32)
Calcium: 8.8 mg/dL — ABNORMAL LOW (ref 8.9–10.3)
Chloride: 102 mmol/L (ref 98–111)
Creatinine, Ser: 0.73 mg/dL (ref 0.61–1.24)
GFR, Estimated: >60 mL/min (ref >60)
Glucose, Bld: 184 mg/dL — ABNORMAL HIGH (ref 70–99)
Potassium: 3.9 mmol/L (ref 3.5–5.1)
Sodium: 138 mmol/L (ref 135–145)
Total Bilirubin: 2.7 mg/dL — ABNORMAL HIGH (ref 0.3–1.2)
Total Protein: 7.5 g/dL (ref 6.5–8.1)

## 2023-07-24 NOTE — ED Triage Notes (Signed)
Pt to ED for emesis x3 days. +abd pain.

## 2023-07-25 ENCOUNTER — Other Ambulatory Visit: Payer: Self-pay | Admitting: Nurse Practitioner

## 2023-07-25 DIAGNOSIS — R1011 Right upper quadrant pain: Secondary | ICD-10-CM

## 2023-07-25 DIAGNOSIS — R630 Anorexia: Secondary | ICD-10-CM

## 2023-07-25 DIAGNOSIS — R7989 Other specified abnormal findings of blood chemistry: Secondary | ICD-10-CM

## 2023-07-26 ENCOUNTER — Ambulatory Visit
Admission: RE | Admit: 2023-07-26 | Discharge: 2023-07-26 | Discharge status: Home / Self Care | Payer: Medicare Other | Source: Ambulatory Visit | Attending: Nurse Practitioner | Admitting: Nurse Practitioner

## 2023-07-26 DIAGNOSIS — K8000 Calculus of gallbladder with acute cholecystitis without obstruction: Secondary | ICD-10-CM | POA: Diagnosis not present

## 2023-07-26 DIAGNOSIS — R1011 Right upper quadrant pain: Secondary | ICD-10-CM

## 2023-07-26 DIAGNOSIS — R7989 Other specified abnormal findings of blood chemistry: Secondary | ICD-10-CM | POA: Insufficient documentation

## 2023-07-26 DIAGNOSIS — R630 Anorexia: Secondary | ICD-10-CM

## 2023-07-26 DIAGNOSIS — K810 Acute cholecystitis: Secondary | ICD-10-CM | POA: Diagnosis not present

## 2023-07-28 ENCOUNTER — Inpatient Hospital Stay
Admission: EM | Admit: 2023-07-28 | Discharge: 2023-07-30 | DRG: 445 | Disposition: A | Discharge status: Home / Self Care | Payer: Medicare Other | Attending: Internal Medicine | Admitting: Internal Medicine

## 2023-07-28 ENCOUNTER — Other Ambulatory Visit: Payer: Self-pay

## 2023-07-28 ENCOUNTER — Emergency Department: Payer: Medicare Other

## 2023-07-28 ENCOUNTER — Inpatient Hospital Stay: Payer: Medicare Other | Admitting: Radiology

## 2023-07-28 ENCOUNTER — Encounter: Payer: Self-pay | Admitting: Emergency Medicine

## 2023-07-28 DIAGNOSIS — Z79899 Other long term (current) drug therapy: Secondary | ICD-10-CM | POA: Diagnosis not present

## 2023-07-28 DIAGNOSIS — Z8673 Personal history of transient ischemic attack (TIA), and cerebral infarction without residual deficits: Secondary | ICD-10-CM | POA: Diagnosis not present

## 2023-07-28 DIAGNOSIS — Z23 Encounter for immunization: Secondary | ICD-10-CM | POA: Diagnosis present

## 2023-07-28 DIAGNOSIS — I10 Essential (primary) hypertension: Secondary | ICD-10-CM | POA: Diagnosis present

## 2023-07-28 DIAGNOSIS — Z794 Long term (current) use of insulin: Secondary | ICD-10-CM

## 2023-07-28 DIAGNOSIS — Z6832 Body mass index (BMI) 32.0-32.9, adult: Secondary | ICD-10-CM

## 2023-07-28 DIAGNOSIS — K862 Cyst of pancreas: Secondary | ICD-10-CM | POA: Diagnosis present

## 2023-07-28 DIAGNOSIS — G629 Polyneuropathy, unspecified: Secondary | ICD-10-CM

## 2023-07-28 DIAGNOSIS — Z7985 Long-term (current) use of injectable non-insulin antidiabetic drugs: Secondary | ICD-10-CM | POA: Diagnosis not present

## 2023-07-28 DIAGNOSIS — K8000 Calculus of gallbladder with acute cholecystitis without obstruction: Principal | ICD-10-CM | POA: Diagnosis present

## 2023-07-28 DIAGNOSIS — E785 Hyperlipidemia, unspecified: Secondary | ICD-10-CM | POA: Diagnosis present

## 2023-07-28 DIAGNOSIS — E113492 Type 2 diabetes mellitus with severe nonproliferative diabetic retinopathy without macular edema, left eye: Secondary | ICD-10-CM | POA: Diagnosis present

## 2023-07-28 DIAGNOSIS — K219 Gastro-esophageal reflux disease without esophagitis: Secondary | ICD-10-CM | POA: Diagnosis present

## 2023-07-28 DIAGNOSIS — K810 Acute cholecystitis: Principal | ICD-10-CM

## 2023-07-28 DIAGNOSIS — Z833 Family history of diabetes mellitus: Secondary | ICD-10-CM

## 2023-07-28 DIAGNOSIS — Z7982 Long term (current) use of aspirin: Secondary | ICD-10-CM | POA: Diagnosis not present

## 2023-07-28 DIAGNOSIS — I2510 Atherosclerotic heart disease of native coronary artery without angina pectoris: Secondary | ICD-10-CM | POA: Diagnosis present

## 2023-07-28 DIAGNOSIS — E113292 Type 2 diabetes mellitus with mild nonproliferative diabetic retinopathy without macular edema, left eye: Secondary | ICD-10-CM | POA: Diagnosis present

## 2023-07-28 DIAGNOSIS — E113391 Type 2 diabetes mellitus with moderate nonproliferative diabetic retinopathy without macular edema, right eye: Secondary | ICD-10-CM | POA: Diagnosis present

## 2023-07-28 DIAGNOSIS — Z951 Presence of aortocoronary bypass graft: Secondary | ICD-10-CM

## 2023-07-28 DIAGNOSIS — E65 Localized adiposity: Secondary | ICD-10-CM | POA: Diagnosis not present

## 2023-07-28 DIAGNOSIS — R300 Dysuria: Secondary | ICD-10-CM

## 2023-07-28 DIAGNOSIS — Z7984 Long term (current) use of oral hypoglycemic drugs: Secondary | ICD-10-CM

## 2023-07-28 DIAGNOSIS — R7989 Other specified abnormal findings of blood chemistry: Secondary | ICD-10-CM | POA: Diagnosis not present

## 2023-07-28 DIAGNOSIS — E669 Obesity, unspecified: Secondary | ICD-10-CM | POA: Diagnosis present

## 2023-07-28 DIAGNOSIS — E119 Type 2 diabetes mellitus without complications: Secondary | ICD-10-CM

## 2023-07-28 DIAGNOSIS — E1129 Type 2 diabetes mellitus with other diabetic kidney complication: Secondary | ICD-10-CM | POA: Diagnosis present

## 2023-07-28 DIAGNOSIS — Z8249 Family history of ischemic heart disease and other diseases of the circulatory system: Secondary | ICD-10-CM

## 2023-07-28 DIAGNOSIS — E1169 Type 2 diabetes mellitus with other specified complication: Secondary | ICD-10-CM

## 2023-07-28 DIAGNOSIS — K82A2 Perforation of gallbladder in cholecystitis: Secondary | ICD-10-CM | POA: Diagnosis present

## 2023-07-28 DIAGNOSIS — E11319 Type 2 diabetes mellitus with unspecified diabetic retinopathy without macular edema: Secondary | ICD-10-CM | POA: Diagnosis not present

## 2023-07-28 HISTORY — PX: IR PERC CHOLECYSTOSTOMY: IMG2326

## 2023-07-28 LAB — URINALYSIS, ROUTINE W REFLEX MICROSCOPIC
Bacteria, UA: NONE SEEN
Bilirubin Urine: NEGATIVE
Glucose, UA: >=500 mg/dL — AB
Hgb urine dipstick: NEGATIVE
Ketones, ur: 5 mg/dL — AB
Nitrite: NEGATIVE
Protein, ur: 30 mg/dL — AB
Specific Gravity, Urine: 1.032 — ABNORMAL HIGH (ref 1.005–1.030)
pH: 5 (ref 5.0–8.0)

## 2023-07-28 LAB — CBC WITH DIFFERENTIAL/PLATELET
Abs Immature Granulocytes: 0.02 10*3/uL (ref 0.00–0.07)
Basophils Absolute: 0 10*3/uL (ref 0.0–0.1)
Basophils Relative: 0 %
Eosinophils Absolute: 0.1 10*3/uL (ref 0.0–0.5)
Eosinophils Relative: 1 %
HCT: 43.9 % (ref 39.0–52.0)
Hemoglobin: 14.2 g/dL (ref 13.0–17.0)
Immature Granulocytes: 0 %
Lymphocytes Relative: 12 %
Lymphs Abs: 1 10*3/uL (ref 0.7–4.0)
MCH: 30.1 pg (ref 26.0–34.0)
MCHC: 32.3 g/dL (ref 30.0–36.0)
MCV: 93.2 fL (ref 80.0–100.0)
Monocytes Absolute: 0.9 10*3/uL (ref 0.1–1.0)
Monocytes Relative: 11 %
Neutro Abs: 6.3 10*3/uL (ref 1.7–7.7)
Neutrophils Relative %: 76 %
Platelets: 177 10*3/uL (ref 150–400)
RBC: 4.71 MIL/uL (ref 4.22–5.81)
RDW: 14.1 % (ref 11.5–15.5)
Smear Review: NORMAL
WBC: 8.4 10*3/uL (ref 4.0–10.5)
nRBC: 0 % (ref 0.0–0.2)

## 2023-07-28 LAB — COMPREHENSIVE METABOLIC PANEL
ALT: 463 U/L — ABNORMAL HIGH (ref 0–44)
AST: 513 U/L — ABNORMAL HIGH (ref 15–41)
Albumin: 2.9 g/dL — ABNORMAL LOW (ref 3.5–5.0)
Alkaline Phosphatase: 168 U/L — ABNORMAL HIGH (ref 38–126)
Anion gap: 8 (ref 5–15)
BUN: 18 mg/dL (ref 8–23)
CO2: 23 mmol/L (ref 22–32)
Calcium: 8.3 mg/dL — ABNORMAL LOW (ref 8.9–10.3)
Chloride: 105 mmol/L (ref 98–111)
Creatinine, Ser: 0.68 mg/dL (ref 0.61–1.24)
GFR, Estimated: >60 mL/min (ref >60)
Glucose, Bld: 114 mg/dL — ABNORMAL HIGH (ref 70–99)
Potassium: 3.5 mmol/L (ref 3.5–5.1)
Sodium: 136 mmol/L (ref 135–145)
Total Bilirubin: 1.2 mg/dL (ref 0.3–1.2)
Total Protein: 7.1 g/dL (ref 6.5–8.1)

## 2023-07-28 LAB — ACETAMINOPHEN LEVEL: Acetaminophen (Tylenol), Serum: <10 ug/mL — ABNORMAL LOW (ref 10–30)

## 2023-07-28 LAB — PROTIME-INR
INR: 1.2 (ref 0.8–1.2)
Prothrombin Time: 15.5 s — ABNORMAL HIGH (ref 11.4–15.2)

## 2023-07-28 LAB — GLUCOSE, CAPILLARY
Glucose-Capillary: 80 mg/dL (ref 70–99)
Glucose-Capillary: 137 mg/dL — ABNORMAL HIGH (ref 70–99)

## 2023-07-28 LAB — LIPASE, BLOOD: Lipase: 21 U/L (ref 11–51)

## 2023-07-28 MED ORDER — MORPHINE SULFATE (PF) 4 MG/ML IV SOLN: 4.0000 mg | INTRAVENOUS | Status: AC | PRN

## 2023-07-28 MED ORDER — PIPERACILLIN-TAZOBACTAM 3.375 G IVPB
3.3750 g | Freq: Three times a day (TID) | INTRAVENOUS | Status: DC
  Administered 2023-07-28 – 2023-07-30 (×5): 3.375 g via INTRAVENOUS
  Filled 2023-07-28 (×5): qty 50

## 2023-07-28 MED ORDER — SODIUM CHLORIDE 0.9 % IV BOLUS
1000.0000 mL | Freq: Once | INTRAVENOUS | Status: AC
  Administered 2023-07-28: 1000 mL via INTRAVENOUS

## 2023-07-28 MED ORDER — FENTANYL CITRATE PF 50 MCG/ML IJ SOSY: 25.0000 ug | PREFILLED_SYRINGE | INTRAMUSCULAR | Status: AC | PRN

## 2023-07-28 MED ORDER — MIDAZOLAM HCL 2 MG/2ML IJ SOLN
INTRAMUSCULAR | Status: AC
  Filled 2023-07-28: qty 2

## 2023-07-28 MED ORDER — PNEUMOCOCCAL 20-VAL CONJ VACC 0.5 ML IM SUSY
0.5000 mL | PREFILLED_SYRINGE | INTRAMUSCULAR | Status: AC
  Administered 2023-07-29: 0.5 mL via INTRAMUSCULAR
  Filled 2023-07-28: qty 0.5

## 2023-07-28 MED ORDER — INSULIN ASPART PROT & ASPART (70-30 MIX) 100 UNIT/ML ~~LOC~~ SUSP
25.0000 [IU] | Freq: Two times a day (BID) | SUBCUTANEOUS | Status: DC
  Administered 2023-07-29 (×2): 25 [IU] via SUBCUTANEOUS
  Filled 2023-07-28: qty 10

## 2023-07-28 MED ORDER — INSULIN ASPART 100 UNIT/ML IJ SOLN: 0.0000 [IU] | Freq: Every day | INTRAMUSCULAR | Status: DC

## 2023-07-28 MED ORDER — FENTANYL CITRATE (PF) 100 MCG/2ML IJ SOLN
INTRAMUSCULAR | Status: AC
  Filled 2023-07-28: qty 2

## 2023-07-28 MED ORDER — ACETAMINOPHEN 650 MG RE SUPP: 650.0000 mg | Freq: Four times a day (QID) | RECTAL | Status: DC | PRN

## 2023-07-28 MED ORDER — IOHEXOL 300 MG/ML  SOLN
50.0000 mL | Freq: Once | INTRAMUSCULAR | Status: AC | PRN
  Administered 2023-07-28: 8 mL

## 2023-07-28 MED ORDER — HYDRALAZINE HCL 20 MG/ML IJ SOLN: 5.0000 mg | Freq: Four times a day (QID) | INTRAMUSCULAR | Status: DC | PRN

## 2023-07-28 MED ORDER — SENNOSIDES-DOCUSATE SODIUM 8.6-50 MG PO TABS: 1.0000 | ORAL_TABLET | Freq: Every evening | ORAL | Status: DC | PRN

## 2023-07-28 MED ORDER — INFLUENZA VAC A&B SURF ANT ADJ 0.5 ML IM SUSY
0.5000 mL | PREFILLED_SYRINGE | INTRAMUSCULAR | Status: AC
  Administered 2023-07-29: 0.5 mL via INTRAMUSCULAR
  Filled 2023-07-28: qty 0.5

## 2023-07-28 MED ORDER — ACETAMINOPHEN 325 MG PO TABS
650.0000 mg | ORAL_TABLET | Freq: Four times a day (QID) | ORAL | Status: DC | PRN
  Administered 2023-07-29: 650 mg via ORAL
  Filled 2023-07-28: qty 2

## 2023-07-28 MED ORDER — DM-GUAIFENESIN ER 30-600 MG PO TB12: 1.0000 | ORAL_TABLET | Freq: Two times a day (BID) | ORAL | Status: DC | PRN

## 2023-07-28 MED ORDER — ONDANSETRON HCL 4 MG/2ML IJ SOLN: 4.0000 mg | Freq: Four times a day (QID) | INTRAMUSCULAR | Status: DC | PRN

## 2023-07-28 MED ORDER — INSULIN ASPART 100 UNIT/ML IJ SOLN
0.0000 [IU] | Freq: Three times a day (TID) | INTRAMUSCULAR | Status: DC
  Administered 2023-07-29: 2 [IU] via SUBCUTANEOUS
  Administered 2023-07-30: 3 [IU] via SUBCUTANEOUS
  Filled 2023-07-28 (×2): qty 1

## 2023-07-28 MED ORDER — CARVEDILOL 6.25 MG PO TABS
3.1250 mg | ORAL_TABLET | Freq: Every day | ORAL | Status: DC
  Administered 2023-07-29: 3.125 mg via ORAL
  Filled 2023-07-28: qty 1

## 2023-07-28 MED ORDER — ONDANSETRON HCL 4 MG/2ML IJ SOLN
4.0000 mg | Freq: Once | INTRAMUSCULAR | Status: AC
  Administered 2023-07-28: 4 mg via INTRAVENOUS
  Filled 2023-07-28: qty 2

## 2023-07-28 MED ORDER — HEPARIN SODIUM (PORCINE) 5000 UNIT/ML IJ SOLN
5000.0000 [IU] | Freq: Three times a day (TID) | INTRAMUSCULAR | Status: DC
  Administered 2023-07-29 – 2023-07-30 (×3): 5000 [IU] via SUBCUTANEOUS
  Filled 2023-07-28 (×3): qty 1

## 2023-07-28 MED ORDER — LIDOCAINE HCL 1 % IJ SOLN
INTRAMUSCULAR | Status: AC
  Filled 2023-07-28: qty 20

## 2023-07-28 MED ORDER — ONDANSETRON HCL 4 MG PO TABS: 4.0000 mg | ORAL_TABLET | Freq: Four times a day (QID) | ORAL | Status: DC | PRN

## 2023-07-28 MED ORDER — FENTANYL CITRATE (PF) 100 MCG/2ML IJ SOLN
INTRAMUSCULAR | Status: AC | PRN
Start: 2023-07-28 — End: 2023-07-28
  Administered 2023-07-28: 25 ug via INTRAVENOUS
  Administered 2023-07-28: 50 ug via INTRAVENOUS
  Administered 2023-07-28: 25 ug via INTRAVENOUS

## 2023-07-28 MED ORDER — KETOROLAC TROMETHAMINE 15 MG/ML IJ SOLN
10.0000 mg | Freq: Once | INTRAMUSCULAR | Status: AC
  Administered 2023-07-28: 10 mg via INTRAVENOUS
  Filled 2023-07-28: qty 1

## 2023-07-28 MED ORDER — POLYETHYLENE GLYCOL 3350 17 G PO PACK: 17.0000 g | PACK | Freq: Two times a day (BID) | ORAL | Status: DC | PRN

## 2023-07-28 MED ORDER — SENNA 8.6 MG PO TABS: 1.0000 | ORAL_TABLET | Freq: Every evening | ORAL | Status: DC | PRN

## 2023-07-28 MED ORDER — MIDAZOLAM HCL 5 MG/5ML IJ SOLN
INTRAMUSCULAR | Status: AC | PRN
Start: 2023-07-28 — End: 2023-07-28
  Administered 2023-07-28 (×2): .5 mg via INTRAVENOUS

## 2023-07-28 MED ORDER — ENSURE ENLIVE PO LIQD
237.0000 mL | Freq: Two times a day (BID) | ORAL | Status: DC
  Administered 2023-07-29 – 2023-07-30 (×3): 237 mL via ORAL

## 2023-07-28 MED ORDER — SODIUM CHLORIDE 0.9% FLUSH
5.0000 mL | Freq: Three times a day (TID) | INTRAVENOUS | Status: DC
  Administered 2023-07-28 – 2023-07-30 (×6): 5 mL

## 2023-07-28 MED ORDER — SODIUM CHLORIDE 0.9 % IV SOLN: INTRAVENOUS | Status: DC | PRN

## 2023-07-28 MED ORDER — ASPIRIN 81 MG PO TBEC
81.0000 mg | DELAYED_RELEASE_TABLET | Freq: Every day | ORAL | Status: DC
  Administered 2023-07-29 – 2023-07-30 (×2): 81 mg via ORAL
  Filled 2023-07-28 (×2): qty 1

## 2023-07-28 MED ORDER — PIPERACILLIN-TAZOBACTAM 3.375 G IVPB 30 MIN
3.3750 g | Freq: Once | INTRAVENOUS | Status: AC
  Administered 2023-07-28: 3.375 g via INTRAVENOUS
  Filled 2023-07-28: qty 50

## 2023-07-28 MED ORDER — LIDOCAINE HCL 1 % IJ SOLN
10.0000 mL | Freq: Once | INTRAMUSCULAR | Status: AC
  Administered 2023-07-28: 20 mL via INTRADERMAL
  Filled 2023-07-28: qty 10

## 2023-07-28 MED ORDER — MIDAZOLAM HCL 2 MG/2ML IJ SOLN
INTRAMUSCULAR | Status: AC | PRN
Start: 2023-07-28 — End: 2023-07-28
  Administered 2023-07-28: 1 mg via INTRAVENOUS

## 2023-07-28 NOTE — Progress Notes (Signed)
Pharmacy Antibiotic Note  Alexander [name] is a 72 y.o. male w/ PMH of CHF, CAD, DM, HTN admitted on 07/28/2023 with cholelithiasis.  Pharmacy has been consulted for Zosyn dosing.  Plan: start  Zosyn 3.375g IV q8h (4 hour infusion). ---follow renal function for needed dose adjustment  Height: 6' (182.9 cm) Weight: 108.9 kg (240 lb) IBW/kg (Calculated) : 77.6  Temp (24hrs), Avg:97.8 F (36.6 C), Min:97.6 F (36.4 C), Max:97.9 F (36.6 C)  Recent Labs  Lab 07/24/23 1724 07/28/23 0657 07/28/23 0751  WBC 13.0* 8.4  --   CREATININE 0.73  --  0.68    Estimated Creatinine Clearance: 107.9 mL/min (by C-G formula based on SCr of 0.68 mg/dL).    No Known Allergies  Antimicrobials this admission: 10/04 Zosyn >>   Microbiology results: none currently  Thank you for allowing pharmacy to be a part of this patient's care.  Lowella Bandy 07/28/2023 1:32 PM

## 2023-07-28 NOTE — H&P (Signed)
History and Physical   Alexander [name] ZOX:[id number] DOB: May 19, 1951 DOA: 07/28/2023  PCP: Myrene Buddy, NP  Outpatient Specialists: Dr. Darrold Junker Patient coming from: Home  I have personally briefly reviewed patient's old medical records in [facility] EMR.  Chief Concern: Nausea, vomiting, gallstones  HPI: Alexander [name] is a patient with CAD status post CABG in 2017, heart failure reduced ejection fraction, insulin-dependent diabetes mellitus, hypertension, hyperlipidemia, who presents to the emergency department for chief concerns of right-sided abdominal pain in setting of recent diagnosis of gallstones.  Per ED note, patient endorses nausea, vomiting.  Initial vitals in the ED showed temperature 97.6, respiration rate of 14, heart rate of 88, blood pressure 132/77, SpO2 95% on room air.  Serum sodium is 136, potassium 3.5, chloride 105, bicarb 23, BUN of 18, serum creatinine of 0.68, EGFR greater than 60, nonfasting blood glucose 114, WBC 8.4, hemoglobin 14.2, platelets of 177.  MRI of abdominal MRCP wo contrast: Was read as cholelithiasis.  Mild diffuse gallbladder wall thickening and large amount of pericholecystic fluid.  Small focal discontinuity in the anterior gallbladder wall body wall.  Findings concerning for perforated acute cholecystitis.  Unilocular simple appearing cystic pancreatic lesion, largest 0.4 cm in pancreatic body.  Recommend MR RI of the abdomen without contrast and with IV contrast in 2 years.  ED treatment: Toradol 10 mg IV, ondansetron 4 mg IV, Zosyn 3.375 g IV, sodium chloride 1 L bolus.  EDP consulted with general surgery and IR for percutaneous drain placement. ------------------------------ At bedside, patient is able to tell me his name, age, current location, current calendar year.   He reprots he has been having RUQ for about 3 weeks. It is worse with coughing and sneezing. He has had poor PO intake. He denies trauma to his person.   He  endorses subjective fever, and chills. He denies shortness of breath. He endorses mild dysuria for about two weeks.  Social history: He lives with his spouse. He denies tobacco, etoh, recreational drug use. He current works in his own business, Aeronautical engineer.   ROS: Constitutional: no weight change, no fever ENT/Mouth: no sore throat, no rhinorrhea Eyes: no eye pain, no vision changes Cardiovascular: no chest pain, no dyspnea,  no edema, no palpitations Respiratory: no cough, no sputum, no wheezing Gastrointestinal: no nausea, no vomiting, no diarrhea, no constipation Genitourinary: no urinary incontinence, no dysuria, no hematuria Musculoskeletal: no arthralgias, no myalgias Skin: no skin lesions, no pruritus, Neuro: + weakness, no loss of consciousness, no syncope Psych: no anxiety, no depression, + decrease appetite Heme/Lymph: no bruising, no bleeding  ED Course: Discussed with emergency medicine provider, patient requiring hospitalization for chief concerns of acute cholecystitis.  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)   Polyneuropathy   Severe nonproliferative diabetic retinopathy of left eye (HCC)   CAD (coronary artery disease)   Dysuria   Elevated LFTs   Truncal obesity   Assessment and Plan:  * Acute cholecystitis Status post IR percutaneous cholecystostomy via interventional radiology Continue Zosyn per pharmacy Symptomatic support acetaminophen 650 mg p.o./rectal as needed for mild pain, fever, headache; morphine 4 mg IV every 4 hours as needed for moderate pain, 20 hours of coverage ordered; fentanyl 25 mcg IV every 4 hours as needed for severe  pain, 20 hours of coverage ordered Clear liquids post IR procedure at General  Surgery recommendation Admit to telemetry medical, inpatient  Truncal obesity This complicates overall care and prognosis.   Elevated LFTs Presumed secondary to acute cholecystitis with perforation Patient is status post IR percutaneous cholecystostomy and sodium chloride 1 L bolus per EDP Holding home atorvastatin, a.m. team to resume when the benefits outweigh the risk Recheck LFTs in the a.m.  Dysuria UA ordered, pending collection  History of CVA (cerebrovascular accident) Home aspirin 81 mg daily resume Atorvastatin not resumed on admission due to elevated LFTs, a.m. team to resume when the benefits outweigh the risk  Diabetes mellitus (HCC) NPH (70/30), inject 25-30, BID with meals ordered as 25 units twice daily with meals Insulin SSI with HS coverage ordered  Hypertension Coreg 3.125 mg daily with supper resumed  Chart reviewed.   DVT prophylaxis: Heparin 5000 units subcutaneous every 8 hours starting on 07/29/2023 Code Status: full code Diet: N.p.o. in anticipation of IR procedure; heart healthy/carb modified diet ordered for after IR procedure Family Communication: a phone call was offered, patietn declined stating spouse is already aware and updated Disposition Plan: Pending clinical course Consults called: IR via EDP Admission status: Telemetry medical, inpatient  Past Medical History:  Diagnosis Date   Anginal pain (HCC)    CHF (congestive heart failure) (HCC)    Coronary artery disease    Diabetes mellitus without complication (HCC)    Type 2   GERD (gastroesophageal reflux disease)    History of kidney stones    Hypertension    Stroke (HCC)    mini, tingling right hand   Past Surgical History:  Procedure Laterality Date   CARDIAC CATHETERIZATION Left 08/09/2016   Procedure: Left Heart Cath and Coronary Angiography;  Surgeon: Dalia Heading, MD;  Location: [facility] INVASIVE CV [facility];  Service: Cardiovascular;  Laterality: Left;   CATARACT EXTRACTION W/PHACO  Right 02/06/2023   Procedure: CATARACT EXTRACTION PHACO AND INTRAOCULAR LENS PLACEMENT (IOC) AHMED TUBE SHUNT W/ TUTOPLAST RIGHT DIABETIC  3.94   00:42.6;  Surgeon: Nevada Crane, MD;  Location: [facility];  Service: Ophthalmology;  Laterality: Right;   COLONOSCOPY WITH PROPOFOL N/A 02/02/2021   Procedure: COLONOSCOPY WITH PROPOFOL;  Surgeon: Regis Bill, MD;  Location: [facility] ENDOSCOPY;  Service: Endoscopy;  Laterality: N/A;   CORONARY ARTERY BYPASS GRAFT     2017 x5   IR PERC CHOLECYSTOSTOMY  07/28/2023   Social History:  reports that he has never smoked. He has never used smokeless tobacco. He reports that he does not drink alcohol and does not use drugs.  No Known Allergies Family History  Problem Relation Age of Onset   Hypertension Mother    Diabetes Mother    Family history: Family history reviewed and not pertinent.  Prior to Admission medications   Medication Sig Start Date End Date Taking? Authorizing Provider  acetaminophen (TYLENOL) 500 MG tablet Take 1,000 mg by mouth every 6 (six) hours as needed for mild pain or headache.    [provider]  aspirin EC 81 MG tablet Take 81 mg by mouth daily.    [provider]  atorvastatin (LIPITOR) 80 MG tablet Take 80 mg by mouth daily. 02/04/19   [provider]  benzonatate (TESSALON PERLES) 100 MG capsule Take 1 capsule (100 mg total) by mouth 3 (three) times daily as needed for cough. 07/06/23 07/05/24  Arnetha Courser, MD  carvedilol (COREG) 3.125 MG tablet Take 3.125 mg by  mouth daily with supper.    [provider]  dextromethorphan-guaiFENesin (MUCINEX DM) 30-600 MG 12hr tablet Take 1 tablet by mouth 2 (two) times daily as needed for cough. 07/06/23   Arnetha Courser, MD  empagliflozin (JARDIANCE) 25 MG TABS tablet Take 25 mg by mouth daily. 12/27/18   [provider]  insulin NPH-regular Human (NOVOLIN 70/30) (70-30) 100 UNIT/ML injection Inject 25-30 Units into the skin 2  (two) times daily with a meal.    [provider]  OZEMPIC, 2 MG/DOSE, 8 MG/3ML SOPN Inject 2 mg into the skin once a week. Tuesday    [provider]  senna (SENOKOT) 8.6 MG TABS tablet Take 1 tablet (8.6 mg total) by mouth at bedtime as needed for mild constipation. 07/06/23   Arnetha Courser, MD   Physical Exam: Vitals:   07/28/23 1531 07/28/23 1536 07/28/23 1545 07/28/23 1606  BP: (!) 148/82 (!) 148/82 (!) 146/87 123/69  Pulse: 94 (!) 0 92 84  Resp: 20  16 16   Temp:    98.6 F (37 C)  TempSrc:      SpO2: 96%  94% 94%  Weight:      Height:       Constitutional: appears age-appropriate, NAD, calm Eyes: PERRL, lids and conjunctivae normal ENMT: Mucous membranes are moist. Posterior pharynx clear of any exudate or lesions. Age-appropriate dentition. Hearing appropriate Neck: normal, supple, no masses, no thyromegaly Respiratory: clear to auscultation bilaterally, no wheezing, no crackles. Normal respiratory effort. No accessory muscle use.  Cardiovascular: Regular rate and rhythm, no murmurs / rubs / gallops. No extremity edema. 2+ pedal pulses. No carotid bruits.  Abdomen: Obese abdomen, no tenderness, no masses palpated, no hepatosplenomegaly. Bowel sounds positive.  Percutaneous cholecystostomy drain in place Musculoskeletal: no clubbing / cyanosis. No joint deformity upper and lower extremities. Good ROM, no contractures, no atrophy. Normal muscle tone.  Skin: no rashes, lesions, ulcers. No induration Neurologic: Sensation intact. Strength 5/5 in all 4.  Psychiatric: Normal judgment and insight. Alert and oriented x 3. Normal mood.   EKG: Not indicated at this time  Chest x-ray on Admission: I personally reviewed and I agree with radiologist reading as below.  IR Perc Cholecystostomy  Result Date: 07/28/2023 INDICATION: Acute perforated cholecystitis EXAM: ULTRASOUND AND FLUOROSCOPIC-GUIDED CHOLECYSTOSTOMY TUBE PLACEMENT COMPARISON:  US Abdomen and MRCP,  07/28/2023. MEDICATIONS: The patient is currently admitted to the hospital and on intravenous antibiotics. Antibiotics were administered within an appropriate time frame prior to skin puncture. ANESTHESIA/SEDATION: Moderate (conscious) sedation was employed during this procedure. A total of Versed 2 mg and Fentanyl 100 mcg was administered intravenously. Moderate Sedation Time: 16 minutes. The patient's level of consciousness and vital signs were monitored continuously by radiology nursing throughout the procedure under my direct supervision. CONTRAST:  8mL OMNIPAQUE IOHEXOL 300 MG/ML SOLN - administered into the gallbladder fossa. FLUOROSCOPY TIME:  Fluoroscopic dose; 17 mGy COMPLICATIONS: None immediate. PROCEDURE: Informed written consent was obtained from the patient and/or patient's representative after a discussion of the risks, benefits and alternatives to treatment. Questions regarding the procedure were encouraged and answered. A timeout was performed prior to the initiation of the procedure. The right upper abdominal quadrant was prepped and draped in the usual sterile fashion, and a sterile drape was applied covering the operative field. Maximum barrier sterile technique with sterile gowns and gloves were used for the procedure. A timeout was performed prior to the initiation of the procedure. Local anesthesia was provided with 1% lidocaine with epinephrine. Ultrasound  scanning of the right upper quadrant demonstrates a markedly dilated gallbladder. Of note, the patient reported pain with ultrasound imaging over the gallbladder. Utilizing a transhepatic approach, a 22 gauge needle was advanced into the gallbladder under direct ultrasound guidance. An ultrasound image was saved for documentation purposes. Appropriate intraluminal puncture was confirmed with the efflux of bile and advancement of an 0.018 wire into the gallbladder lumen. The needle was exchanged for an Accustick set. A small amount of  contrast was injected to confirm appropriate intraluminal positioning. Over a Benson wire, a 10 Fr cholecystomy tube was advanced into the gallbladder fossa, coiled and locked. Bile was aspirated and a small amount of contrast was injected as several post procedural spot radiographic images were obtained in various obliquities. The catheter was secured to the skin with suture, connected to a drainage bag and a dressing was placed. The patient tolerated the procedure well without immediate post procedural complication. IMPRESSION: Successful ultrasound and fluoroscopic guided placement of a 10 Fr cholecystostomy tube. RECOMMENDATIONS: The patient will return to Vascular Interventional Radiology (VIR) for routine drainage catheter evaluation and exchange in 6 weeks. Roanna Banning, MD [facility] Electronically Signed   By: Roanna Banning M.D.   On: 07/28/2023 15:55   MR 3D Recon At Scanner  Result Date: 07/28/2023 CLINICAL DATA:  Nonspecific (abnormal) findings on radiological and other examination of musculoskeletal system EXAM: 3-DIMENSIONAL MR IMAGE RENDERING ON ACQUISITION WORKSTATION TECHNIQUE: 3-dimensional MR images were rendered by post-processing of the original MR data on an acquisition workstation. The 3-dimensional MR images were interpreted and findings were reported in the accompanying complete MR report for this study COMPARISON:  07/28/2023 abdominal sonogram FINDINGS: Please see the separate concurrent report for the MRI abdomen without contrast including MRCP for details. IMPRESSION: Please see the separate concurrent report for the MRI abdomen without contrast including MRCP for details. Electronically Signed   By: Delbert Phenix M.D.   On: 07/28/2023 13:30   MR ABDOMEN MRCP WO CONTRAST  Result Date: 07/28/2023 CLINICAL DATA:  Severe right lower abdominal pain.  Cholelithiasis. EXAM: MRI ABDOMEN WITHOUT CONTRAST  (INCLUDING MRCP)  TECHNIQUE: Multiplanar multisequence MR imaging of the abdomen was performed. Heavily T2-weighted images of the biliary and pancreatic ducts were obtained, and three-dimensional MRCP images were rendered by post processing. COMPARISON:  07/28/2023 right upper quadrant abdominal sonogram. 03/17/2019 CT abdomen/pelvis. FINDINGS: Lower chest: No acute abnormality at the lung bases. Hepatobiliary: Normal liver size and configuration. No hepatic steatosis. No liver mass. Mildly distended gallbladder. Mild diffuse gallbladder wall thickening. Large amount of pericholecystic fluid. There is a suggestion of a small focal discontinuity in the anterior gallbladder wall body wall on series 14/image 24 and series 16/image 13, cannot exclude gallbladder perforation. Small loss of layering sludge in the gallbladder with 8 mm stone in the gallbladder neck region. No biliary ductal dilatation. Common bile duct diameter 3 mm. No choledocholithiasis. No biliary masses, strictures or beading. Pancreas: No pancreatic duct dilation. Several tiny unilocular simple appearing cystic pancreatic lesions scattered throughout the pancreatic head, neck and body, largest 0.4 cm in the pancreatic body on series 14/image 31. No pancreas divisum. Spleen: Normal size. No mass. Adrenals/Urinary Tract: Normal adrenals. No hydronephrosis. Normal kidneys with no renal mass. Stomach/Bowel: Normal non-distended stomach. Visualized small and large bowel is normal caliber, with no bowel wall thickening. Vascular/Lymphatic: Normal caliber abdominal aorta. No pathologically enlarged lymph nodes in the abdomen. Other: No abdominal ascites or focal fluid collection. Musculoskeletal: No  aggressive appearing focal osseous lesions. IMPRESSION: 1. Cholelithiasis. Mild diffuse gallbladder wall thickening and large amount of pericholecystic fluid. Suggestion of a small focal discontinuity in the anterior gallbladder wall body wall. Findings are concerning for  perforated acute cholecystitis. 2. No biliary ductal dilatation. No choledocholithiasis. 3. 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 No pancreatic duct dilation. Follow-up MRI abdomen without and with IV contrast recommended in 2 years. This recommendation follows ACR consensus guidelines: Management of Incidental Pancreatic Cysts: A White Paper of the ACR Incidental Findings Committee. J Am Coll Radiol 2017;14:911-923. Electronically Signed   By: Delbert Phenix M.D.   On: 07/28/2023 12:41   US ABDOMEN LIMITED RUQ (LIVER/GB)  Result Date: 07/28/2023 CLINICAL DATA:  Right upper quadrant abdominal pain for 3 weeks. Nausea/vomiting. Fevers. EXAM: ULTRASOUND ABDOMEN LIMITED RIGHT UPPER QUADRANT COMPARISON:  CT scan abdomen and pelvis from 03/17/2019. FINDINGS: Gallbladder: Distended. There is mild diffuse wall thickening measuring up to 4 mm. Small amount of sludge and calculi noted. There is small pericholecystic free fluid. Sonographic Murphy's sign was negative as per the technologist. Common bile duct: Diameter: Less than 4 mm. Liver: The technologist noted limited evaluation of the left lobe. Otherwise, no focal lesion identified. Within normal limits in parenchymal echogenicity. Portal vein is patent on color Doppler imaging with normal direction of blood flow towards the liver. Other: Small amount of ascites. IMPRESSION: 1. Distended gallbladder with mild diffuse wall thickening and small pericholecystic free fluid. Small amount of sludge and calculi noted. Sonographic Murphy's sign was negative as per the technologist. Findings are equivocal for acute cholecystitis. If clinically warranted, nuclear medicine hepatobiliary scan may be helpful for further evaluation. 2. Small amount of ascites. Electronically Signed   By: Jules Schick M.D.   On: 07/28/2023 07:51    Labs on Admission: I have personally reviewed  following labs  CBC: Recent Labs  Lab 07/24/23 1724 07/28/23 0657  WBC 13.0* 8.4  NEUTROABS  --  6.3  HGB 15.0 14.2  HCT 47.5 43.9  MCV 96.0 93.2  PLT 155 177   Basic Metabolic Panel: Recent Labs  Lab 07/24/23 1724 07/28/23 0751  NA 138 136  K 3.9 3.5  CL 102 105  CO2 21* 23  GLUCOSE 184* 114*  BUN 24* 18  CREATININE 0.73 0.68  CALCIUM 8.8* 8.3*   GFR: Estimated Creatinine Clearance: 107.9 mL/min (by C-G formula based on SCr of 0.68 mg/dL).  Liver Function Tests: Recent Labs  Lab 07/24/23 1724 07/28/23 0751  AST 110* 513*  ALT 141* 463*  ALKPHOS 99 168*  BILITOT 2.7* 1.2  PROT 7.5 7.1  ALBUMIN 3.6 2.9*   Recent Labs  Lab 07/24/23 1724 07/28/23 0751  LIPASE 20 21   Urine analysis:    Component Value Date/Time   COLORURINE YELLOW (A) 07/05/2023 1044   APPEARANCEUR CLEAR (A) 07/05/2023 1044   LABSPEC 1.032 (H) 07/05/2023 1044   PHURINE 5.0 07/05/2023 1044   GLUCOSEU >=500 (A) 07/05/2023 1044   HGBUR NEGATIVE 07/05/2023 1044   BILIRUBINUR NEGATIVE 07/05/2023 1044   KETONESUR 80 (A) 07/05/2023 1044   PROTEINUR 100 (A) 07/05/2023 1044   NITRITE NEGATIVE 07/05/2023 1044   LEUKOCYTESUR NEGATIVE 07/05/2023 1044   This document was prepared using Dragon Voice Recognition software and may include unintentional dictation errors.  Dr. Sedalia Muta [facility]  If 7PM-7AM, please contact overnight-coverage provider If 7AM-7PM, please contact day attending provider [email or web address]  07/28/2023, 7:32  PM

## 2023-07-28 NOTE — Consult Note (Signed)
[facility] SURGICAL CONSULTATION NOTE (initial) - cpt: [id number]   HISTORY OF PRESENT ILLNESS (HPI):  72 y.o. male presented to [facility] ED today for evaluation of abdominal pain. Patient reports around a 2-week history of RUQ and epigastric abdominal pain which has not subsided. Seems he has had numerous evaluations for this including at this ED on 09/30 and at [facility] ED on 10/02. He would leave AMA from this due to the wait times. He presents today for continued RUQ abdominal pain. He reports associated nausea, emesis, and chills with this. No fever, cough, CP, SOB, urinary changes, bowel changes, or juandice. No previous abdominal surgeries. Work up in the ED here revealed a normal WBC at 8.4K (was as high as 13.0K when drawn as on 09/30), Hgb 14.2, sCr - 0.68, bilirubin 1.2 (was 2.7 on 09/30), and ALT to 463 and AST to 513. He underwent RUQ [place] which was concerning for cholelithiasis and changes to gallbladder wall concerning for possible cholecystitis. He did get MRCP given LFT changes which was concerning for perforated cholecystitis. He is currently on Zosyn.   Surgery is consulted by emergency medicine physician Dr. Sharman Cheek, MD in this context for evaluation and management of perforated cholecystitis.  PAST MEDICAL HISTORY (PMH):  Past Medical History:  Diagnosis Date   Anginal pain (HCC)    CHF (congestive heart failure) (HCC)    Coronary artery disease    Diabetes mellitus without complication (HCC)    Type 2   GERD (gastroesophageal reflux disease)    History of kidney stones    Hypertension    Stroke (HCC)    mini, tingling right hand     PAST SURGICAL HISTORY (PSH):  Past Surgical History:  Procedure Laterality Date   CARDIAC CATHETERIZATION Left 08/09/2016   Procedure: Left Heart Cath and Coronary Angiography;  Surgeon: Dalia Heading, MD;  Location: [facility] INVASIVE CV LAB;  Service: Cardiovascular;  Laterality: Left;   CATARACT EXTRACTION W/PHACO  Right 02/06/2023   Procedure: CATARACT EXTRACTION PHACO AND INTRAOCULAR LENS PLACEMENT (IOC) AHMED TUBE SHUNT W/ TUTOPLAST RIGHT DIABETIC  3.94   00:42.6;  Surgeon: Nevada Crane, MD;  Location: [facility];  Service: Ophthalmology;  Laterality: Right;   COLONOSCOPY WITH PROPOFOL N/A 02/02/2021   Procedure: COLONOSCOPY WITH PROPOFOL;  Surgeon: Regis Bill, MD;  Location: [facility] ENDOSCOPY;  Service: Endoscopy;  Laterality: N/A;   CORONARY ARTERY BYPASS GRAFT     2017 x5     MEDICATIONS:  Prior to Admission medications   Medication Sig Start Date End Date Taking? Authorizing Provider  acetaminophen (TYLENOL) 500 MG tablet Take 1,000 mg by mouth every 6 (six) hours as needed for mild pain or headache.    [provider]  aspirin EC 81 MG tablet Take 81 mg by mouth daily.    [provider]  atorvastatin (LIPITOR) 80 MG tablet Take 80 mg by mouth daily. 02/04/19   [provider]  benzonatate (TESSALON PERLES) 100 MG capsule Take 1 capsule (100 mg total) by mouth 3 (three) times daily as needed for cough. 07/06/23 07/05/24  Arnetha Courser, MD  carvedilol (COREG) 3.125 MG tablet Take 3.125 mg by mouth daily with supper.    [provider]  dextromethorphan-guaiFENesin (MUCINEX DM) 30-600 MG 12hr tablet Take 1 tablet by mouth 2 (two) times daily as needed for cough. 07/06/23   Arnetha Courser, MD  empagliflozin (JARDIANCE) 25 MG TABS tablet Take 25 mg by mouth daily. 12/27/18   [provider]  insulin NPH-regular Human (NOVOLIN 70/30) (70-30) 100 UNIT/ML injection Inject 25-30 Units into the skin 2 (two) times daily with a meal.    [provider]  OZEMPIC, 2 MG/DOSE, 8 MG/3ML SOPN Inject 2 mg into the skin once a week. Tuesday    [provider]  senna (SENOKOT) 8.6 MG TABS tablet Take 1 tablet (8.6 mg total) by mouth at bedtime as needed for mild constipation. 07/06/23   Arnetha Courser, MD     ALLERGIES:  No Known Allergies    SOCIAL HISTORY:  Social History   Socioeconomic History   Marital status: Married    Spouse name: Not on file   Number of children: Not on file   Years of education: Not on file   Highest education level: Not on file  Occupational History   Not on file  Tobacco Use   Smoking status: Never   Smokeless tobacco: Never  Substance and Sexual Activity   Alcohol use: No   Drug use: No   Sexual activity: Not on file  Other Topics Concern   Not on file  Social History Narrative   Not on file   Social Determinants of Health   Financial Resource Strain: Not on file  Food Insecurity: No Food Insecurity (07/05/2023)   Hunger Vital Sign    Worried About Running Out of Food in the Last Year: Never true    Ran Out of Food in the Last Year: Never true  Transportation Needs: No Transportation Needs (07/05/2023)   PRAPARE - Administrator, Civil Service (Medical): No    Lack of Transportation (Non-Medical): No  Physical Activity: Not on file  Stress: Not on file  Social Connections: Not on file  Intimate Partner Violence: Not At Risk (07/05/2023)   Humiliation, Afraid, Rape, and Kick questionnaire    Fear of Current or Ex-Partner: No    Emotionally Abused: No    Physically Abused: No    Sexually Abused: No     FAMILY HISTORY:  History reviewed. No pertinent family history.    REVIEW OF SYSTEMS:  Review of Systems  Constitutional:  Positive for chills. Negative for fever.  HENT:  Negative for congestion and sore throat.   Cardiovascular:  Negative for chest pain and palpitations.  Gastrointestinal:  Positive for abdominal pain, nausea and vomiting. Negative for constipation and diarrhea.  All other systems reviewed and are negative.   VITAL SIGNS:  Temp:  [97.9 F (36.6 C)] 97.9 F (36.6 C) (10/04 0654) Pulse Rate:  [73-89] 73 (10/04 1000) Resp:  [13-18] 13 (10/04 1000) BP: (115-123)/(65-80) 116/65 (10/04 1000) SpO2:  [96 %-99 %] 99 % (10/04 1000) Weight:   [108.9 kg] 108.9 kg (10/04 0648)     Height: 6' (182.9 cm) Weight: 108.9 kg BMI (Calculated): 32.54   INTAKE/OUTPUT:  No intake/output data recorded.  PHYSICAL EXAM:  Physical Exam Vitals and nursing note reviewed. Exam conducted with a chaperone present.  Constitutional:      General: He is not in acute distress.    Appearance: He is well-developed. He is obese. He is not ill-appearing.     Comments: Resting comfortably; NAD  HENT:     Head: Normocephalic and atraumatic.  Eyes:     General: No scleral icterus.    Extraocular Movements: Extraocular movements intact.  Cardiovascular:     Rate and Rhythm: Normal rate and regular rhythm.     Heart sounds: Normal heart sounds.  Pulmonary:  Effort: Pulmonary effort is normal. No respiratory distress.  Abdominal:     General: There is no distension.     Palpations: Abdomen is soft.     Tenderness: There is abdominal tenderness in the right upper quadrant. There is no guarding or rebound.  Genitourinary:    Comments: Deferred Skin:    General: Skin is warm and dry.     Coloration: Skin is not jaundiced.     Findings: No erythema.  Neurological:     General: No focal deficit present.     Mental Status: He is alert and oriented to person, place, and time.  Psychiatric:        Mood and Affect: Mood normal.        Behavior: Behavior normal.      Labs:     Latest Ref Rng & Units 07/28/2023    6:57 AM 07/24/2023    5:24 PM 07/06/2023    4:24 AM  CBC  WBC 4.0 - 10.5 K/uL 8.4  13.0  10.9   Hemoglobin 13.0 - 17.0 g/dL 32.4  40.1  02.7   Hematocrit 39.0 - 52.0 % 43.9  47.5  41.3   Platelets 150 - 400 K/uL 177  155  149       Latest Ref Rng & Units 07/28/2023    7:51 AM 07/24/2023    5:24 PM 07/06/2023    4:24 AM  CMP  Glucose 70 - 99 mg/dL 253  664  403   BUN 8 - 23 mg/dL 18  24  28    Creatinine 0.61 - 1.24 mg/dL 4.74  2.59  5.63   Sodium 135 - 145 mmol/L 136  138  135   Potassium 3.5 - 5.1 mmol/L 3.5  3.9  3.9   Chloride  98 - 111 mmol/L 105  102  101   CO2 22 - 32 mmol/L 23  21  22    Calcium 8.9 - 10.3 mg/dL 8.3  8.8  8.5   Total Protein 6.5 - 8.1 g/dL 7.1  7.5    Total Bilirubin 0.3 - 1.2 mg/dL 1.2  2.7    Alkaline Phos 38 - 126 U/L 168  99    AST 15 - 41 U/L 513  110    ALT 0 - 44 U/L 463  141       Imaging studies:   RUQ [place] (07/28/2023) personally reviewed concerning for cholelithiasis with gallbladder wall changes and pericholecystic fluid, and radiologist report reviewed below:  IMPRESSION: 1. Distended gallbladder with mild diffuse wall thickening and small pericholecystic free fluid. Small amount of sludge and calculi noted. Sonographic Murphy's sign was negative as per the technologist. Findings are equivocal for acute cholecystitis. If clinically warranted, nuclear medicine hepatobiliary scan may be helpful for further evaluation. 2. Small amount of ascites.   MRCP (07/28/2023) personally reviewed with changes concerning for perforated cholecystitis, no choledocholithiasis, and radiologist report reviewed below:  IMPRESSION: 1. Cholelithiasis. Mild diffuse gallbladder wall thickening and large amount of pericholecystic fluid. Suggestion of a small focal discontinuity in the anterior gallbladder wall body wall. Findings are concerning for perforated acute cholecystitis. 2. No biliary ductal dilatation. No choledocholithiasis. 3. 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 No pancreatic duct dilation. Follow-up MRI abdomen without and with IV contrast recommended in 2 years. This recommendation follows ACR consensus guidelines: Management of Incidental Pancreatic Cysts: A White Paper of the ACR Incidental Findings Committee. J  Am Coll Radiol 2017;14:911-923.   Assessment/Plan: (ICD-10's: K81.0,) 72 y.o. male with perforated cholecystitis   - Appreciate medicine admission - Discussed case with  IR; Given concern for perforation and significant inflammatory response, he is a sub-optimal candidate for surgery. IR in agreement with placement of temporizing cholecystostomy tube. Patient, and family, understand he will require this for 6-8 weeks before considering interval cholecystectomy.    - NPO for planned IR procedure; Okay for CLD following completion if doing well - Agree with IV Abx (Zosyn) - Monitor abdominal examination - Pain control prn; antiemetics prn   - Okay to mobilize   All of the above findings and recommendations were discussed with the patient and his family, and all of their questions were answered to their expressed satisfaction.  Thank you for the opportunity to participate in this patient's care.   -- Lynden Oxford, PA-C [facility] 07/28/2023, 11:55 AM M-F: 7am - 4pm

## 2023-07-28 NOTE — Hospital Course (Addendum)
[name] is a patient with CAD status post CABG in 2017, heart failure reduced ejection fraction, insulin-dependent diabetes mellitus, hypertension, hyperlipidemia, who presents to the emergency department for chief concerns of right-sided abdominal pain in setting of recent diagnosis of gallstones. Per ED note, patient endorses nausea, vomiting.  Patient was having RUQ pain for about 3-week, initially presented to ED on 9/26 but left without seeing, then admitted at [facility] on 10/2 but left AMA.  Initial vitals in the ED showed temperature 97.6, respiration rate of 14, heart rate of 88, blood pressure 132/77, SpO2 95% on room air.  Serum sodium is 136, potassium 3.5, chloride 105, bicarb 23, BUN of 18, serum creatinine of 0.68, EGFR greater than 60, nonfasting blood glucose 114, WBC 8.4, hemoglobin 14.2, platelets of 177.  MRI of abdominal MRCP wo contrast: Was read as cholelithiasis.  Mild diffuse gallbladder wall thickening and large amount of pericholecystic fluid.  Small focal discontinuity in the anterior gallbladder wall body wall.  Findings concerning for perforated acute cholecystitis.  Unilocular simple appearing cystic pancreatic lesion, largest 0.4 cm in pancreatic body.  Recommend MR RI of the abdomen without contrast and with IV contrast in 2 years.  ED treatment: Toradol 10 mg IV, ondansetron 4 mg IV, Zosyn 3.375 g IV, sodium chloride 1 L bolus.  EDP consulted with general surgery and IR for percutaneous drain placement.  10/5: Vital stable, s/p cholecystostomy and drain placement by IR on 10//24, preliminary blood and drain fluid cultures negative.  Persistent transaminitis with small improvement, T. bili at 1.7 Started on diet by general surgery.  10/6: Vital stable.  Cultures remain negative, slowly improving transaminitis.  Patient with no pain and having bowel movement.  Tolerating diet well.  General surgery is recommending 10 more days of antibiotics with  Augmentin.  Patient was instructed to seek medical attention if develop new pain or fever.  Will be high risk for developing any other intra-abdominal abscess due to perforated gallbladder.  Patient will be followed up by general surgery and they will plan elective cholecystectomy.  Patient was also instructed to hold Lipitor as continued to have transaminitis although improving, he need to have an repeat labs done on follow-up and his physician can restart once appropriate.  Patient is being discharged with drain in place, instructions were provided to flush with normal saline.  Patient will follow-up with drain clinic for further recommendations.  Patient will continue the rest of his home medications as he was doing it before and follow-up with his providers for further recommendations.

## 2023-07-28 NOTE — ED Notes (Signed)
Pt back from MRI 

## 2023-07-28 NOTE — Consult Note (Signed)
Chief Complaint: Patient was seen in consultation today for perforated cholecystitis  Referring Physician(s): Lynden Oxford, PA-C  Supervising Physician: Roanna Banning  Patient Status: [facility] - ED  History of Present Illness: Alexander [name] is a 72 y.o. male with a past medical history significant for GERD, CVA, HTN, CHF, CAD, DM who presented to [facility] ED today with complaints of RUQ and epigastric pain, nausea, vomiting and chills x 2 weeks. He was previously seen for the same on 9/30 and 10/2 at outside hospitals but left AMA due to wait times. In the ED he was noted to have WBC 8.4, hgb 14.2, t.bili 1.2, ALT 463, AST 513. Vitals signs within normal limits, on room air.   RUQ US obtained which showed:  1. Distended gallbladder with mild diffuse wall thickening and small pericholecystic free fluid. Small amount of sludge and calculi noted. Sonographic Murphy's sign was negative as per the technologist. Findings are equivocal for acute cholecystitis. If clinically warranted, nuclear medicine hepatobiliary scan may be helpful for further evaluation. 2. Small amount of ascites.  Follow up MRCP showed:  1. Cholelithiasis. Mild diffuse gallbladder wall thickening and large amount of pericholecystic fluid. Suggestion of a small focal discontinuity in the anterior gallbladder wall body wall. Findings are concerning for perforated acute cholecystitis. 2. No biliary ductal dilatation. No choledocholithiasis. 3. 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 No pancreatic duct dilation. Follow-up MRI abdomen without and with IV contrast recommended in 2 years. This recommendation follows ACR consensus guidelines: Management of Incidental Pancreatic Cysts: A White Paper of the ACR Incidental Findings Committee. J Am Coll Radiol 2017;14:911-923.  He was started on IV Zosyn and was seen by general  surgery and given concern for perforation and significant inflammatory response he is currently a sub-optimal candidate for surgery. IR has been consulted for percutaneous cholecystostomy placement.  Patient seen in the ED, family at bedside. Alexander [name] confirms above history, reports abdominal pain has improved since being here - no nausea or vomiting since arrival. Occasional non-shaking chills but none currently, no fever. He understands the procedure including expected drain course and follow up, he is agreeable to proceed.  Past Medical History:  Diagnosis Date   Anginal pain (HCC)    CHF (congestive heart failure) (HCC)    Coronary artery disease    Diabetes mellitus without complication (HCC)    Type 2   GERD (gastroesophageal reflux disease)    History of kidney stones    Hypertension    Stroke (HCC)    mini, tingling right hand    Past Surgical History:  Procedure Laterality Date   CARDIAC CATHETERIZATION Left 08/09/2016   Procedure: Left Heart Cath and Coronary Angiography;  Surgeon: Dalia Heading, MD;  Location: [facility] INVASIVE CV LAB;  Service: Cardiovascular;  Laterality: Left;   CATARACT EXTRACTION W/PHACO Right 02/06/2023   Procedure: CATARACT EXTRACTION PHACO AND INTRAOCULAR LENS PLACEMENT (IOC) AHMED TUBE SHUNT W/ TUTOPLAST RIGHT DIABETIC  3.94   00:42.6;  Surgeon: Nevada Crane, MD;  Location: [facility];  Service: Ophthalmology;  Laterality: Right;   COLONOSCOPY WITH PROPOFOL N/A 02/02/2021   Procedure: COLONOSCOPY WITH PROPOFOL;  Surgeon: Regis Bill, MD;  Location: [facility] ENDOSCOPY;  Service: Endoscopy;  Laterality: N/A;   CORONARY ARTERY BYPASS GRAFT     2017 x5    Allergies: Patient has no known allergies.  Medications: Prior to Admission medications   Medication Sig  Start Date End Date Taking? Authorizing Provider  acetaminophen (TYLENOL) 500 MG tablet Take 1,000 mg by mouth every 6 (six) hours as needed for mild pain or headache.     [provider]  aspirin EC 81 MG tablet Take 81 mg by mouth daily.    [provider]  atorvastatin (LIPITOR) 80 MG tablet Take 80 mg by mouth daily. 02/04/19   [provider]  benzonatate (TESSALON PERLES) 100 MG capsule Take 1 capsule (100 mg total) by mouth 3 (three) times daily as needed for cough. 07/06/23 07/05/24  Arnetha Courser, MD  carvedilol (COREG) 3.125 MG tablet Take 3.125 mg by mouth daily with supper.    [provider]  dextromethorphan-guaiFENesin (MUCINEX DM) 30-600 MG 12hr tablet Take 1 tablet by mouth 2 (two) times daily as needed for cough. 07/06/23   Arnetha Courser, MD  empagliflozin (JARDIANCE) 25 MG TABS tablet Take 25 mg by mouth daily. 12/27/18   [provider]  insulin NPH-regular Human (NOVOLIN 70/30) (70-30) 100 UNIT/ML injection Inject 25-30 Units into the skin 2 (two) times daily with a meal.    [provider]  OZEMPIC, 2 MG/DOSE, 8 MG/3ML SOPN Inject 2 mg into the skin once a week. Tuesday    [provider]  senna (SENOKOT) 8.6 MG TABS tablet Take 1 tablet (8.6 mg total) by mouth at bedtime as needed for mild constipation. 07/06/23   Arnetha Courser, MD     History reviewed. No pertinent family history.  Social History   Socioeconomic History   Marital status: Married    Spouse name: Not on file   Number of children: Not on file   Years of education: Not on file   Highest education level: Not on file  Occupational History   Not on file  Tobacco Use   Smoking status: Never   Smokeless tobacco: Never  Substance and Sexual Activity   Alcohol use: No   Drug use: No   Sexual activity: Not on file  Other Topics Concern   Not on file  Social History Narrative   Not on file   Social Determinants of Health   Financial Resource Strain: Not on file  Food Insecurity: No Food Insecurity (07/05/2023)   Hunger Vital Sign    Worried About Running Out of Food in the Last Year: Never true    Ran Out of  Food in the Last Year: Never true  Transportation Needs: No Transportation Needs (07/05/2023)   PRAPARE - Administrator, Civil Service (Medical): No    Lack of Transportation (Non-Medical): No  Physical Activity: Not on file  Stress: Not on file  Social Connections: Not on file     Review of Systems: A 12 point ROS discussed and pertinent positives are indicated in the HPI above.  All other systems are negative.  Review of Systems  Constitutional:  Negative for chills, fatigue and fever.  Respiratory:  Negative for cough and shortness of breath.   Cardiovascular:  Negative for chest pain.  Gastrointestinal:  Positive for abdominal pain. Negative for diarrhea, nausea and vomiting.  Musculoskeletal:  Negative for back pain.  Neurological:  Negative for dizziness and headaches.    Vital Signs: BP 132/77   Pulse 83   Temp 97.6 F (36.4 C) (Oral)   Resp 11   Ht 6' (1.829 m)   Wt 240 lb (108.9 kg)   SpO2 98%   BMI 32.55 kg/m   Physical Exam Vitals and nursing  note reviewed.  Constitutional:      General: He is not in acute distress.    Appearance: He is not ill-appearing.  HENT:     Head: Normocephalic.     Mouth/Throat:     Mouth: Mucous membranes are moist.     Pharynx: Oropharynx is clear. No oropharyngeal exudate or posterior oropharyngeal erythema.  Eyes:     General: No scleral icterus. Cardiovascular:     Rate and Rhythm: Normal rate and regular rhythm.  Pulmonary:     Effort: Pulmonary effort is normal.     Breath sounds: Normal breath sounds.  Abdominal:     General: There is no distension.     Palpations: Abdomen is soft.     Tenderness: There is abdominal tenderness (RUQ/Epigastric).  Skin:    General: Skin is warm and dry.     Coloration: Skin is not jaundiced.  Neurological:     Mental Status: He is alert and oriented to person, place, and time.  Psychiatric:        Mood and Affect: Mood normal.        Behavior: Behavior normal.         Thought Content: Thought content normal.        Judgment: Judgment normal.      MD Evaluation Airway: WNL Heart: WNL Abdomen: WNL Chest/ Lungs: WNL ASA  Classification: 2 Mallampati/Airway Score: Two   Imaging: MR 3D Recon At Scanner  Result Date: 07/28/2023 CLINICAL DATA:  Nonspecific (abnormal) findings on radiological and other examination of musculoskeletal system EXAM: 3-DIMENSIONAL MR IMAGE RENDERING ON ACQUISITION WORKSTATION TECHNIQUE: 3-dimensional MR images were rendered by post-processing of the original MR data on an acquisition workstation. The 3-dimensional MR images were interpreted and findings were reported in the accompanying complete MR report for this study COMPARISON:  07/28/2023 abdominal sonogram FINDINGS: Please see the separate concurrent report for the MRI abdomen without contrast including MRCP for details. IMPRESSION: Please see the separate concurrent report for the MRI abdomen without contrast including MRCP for details. Electronically Signed   By: Delbert Phenix M.D.   On: 07/28/2023 13:30   MR ABDOMEN MRCP WO CONTRAST  Result Date: 07/28/2023 CLINICAL DATA:  Severe right lower abdominal pain.  Cholelithiasis. EXAM: MRI ABDOMEN WITHOUT CONTRAST  (INCLUDING MRCP) TECHNIQUE: Multiplanar multisequence MR imaging of the abdomen was performed. Heavily T2-weighted images of the biliary and pancreatic ducts were obtained, and three-dimensional MRCP images were rendered by post processing. COMPARISON:  07/28/2023 right upper quadrant abdominal sonogram. 03/17/2019 CT abdomen/pelvis. FINDINGS: Lower chest: No acute abnormality at the lung bases. Hepatobiliary: Normal liver size and configuration. No hepatic steatosis. No liver mass. Mildly distended gallbladder. Mild diffuse gallbladder wall thickening. Large amount of pericholecystic fluid. There is a suggestion of a small focal discontinuity in the anterior gallbladder wall body wall on series 14/image 24 and series  16/image 13, cannot exclude gallbladder perforation. Small loss of layering sludge in the gallbladder with 8 mm stone in the gallbladder neck region. No biliary ductal dilatation. Common bile duct diameter 3 mm. No choledocholithiasis. No biliary masses, strictures or beading. Pancreas: No pancreatic duct dilation. Several tiny unilocular simple appearing cystic pancreatic lesions scattered throughout the pancreatic head, neck and body, largest 0.4 cm in the pancreatic body on series 14/image 31. No pancreas divisum. Spleen: Normal size. No mass. Adrenals/Urinary Tract: Normal adrenals. No hydronephrosis. Normal kidneys with no renal mass. Stomach/Bowel: Normal non-distended stomach. Visualized small and large bowel is normal caliber, with no bowel  wall thickening. Vascular/Lymphatic: Normal caliber abdominal aorta. No pathologically enlarged lymph nodes in the abdomen. Other: No abdominal ascites or focal fluid collection. Musculoskeletal: No aggressive appearing focal osseous lesions. IMPRESSION: 1. Cholelithiasis. Mild diffuse gallbladder wall thickening and large amount of pericholecystic fluid. Suggestion of a small focal discontinuity in the anterior gallbladder wall body wall. Findings are concerning for perforated acute cholecystitis. 2. No biliary ductal dilatation. No choledocholithiasis. 3. 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 No pancreatic duct dilation. Follow-up MRI abdomen without and with IV contrast recommended in 2 years. This recommendation follows ACR consensus guidelines: Management of Incidental Pancreatic Cysts: A White Paper of the ACR Incidental Findings Committee. J Am Coll Radiol 2017;14:911-923. Electronically Signed   By: Delbert Phenix M.D.   On: 07/28/2023 12:41   US ABDOMEN LIMITED RUQ (LIVER/GB)  Result Date: 07/28/2023 CLINICAL DATA:  Right upper quadrant abdominal pain for 3 weeks.  Nausea/vomiting. Fevers. EXAM: ULTRASOUND ABDOMEN LIMITED RIGHT UPPER QUADRANT COMPARISON:  CT scan abdomen and pelvis from 03/17/2019. FINDINGS: Gallbladder: Distended. There is mild diffuse wall thickening measuring up to 4 mm. Small amount of sludge and calculi noted. There is small pericholecystic free fluid. Sonographic Murphy's sign was negative as per the technologist. Common bile duct: Diameter: Less than 4 mm. Liver: The technologist noted limited evaluation of the left lobe. Otherwise, no focal lesion identified. Within normal limits in parenchymal echogenicity. Portal vein is patent on color Doppler imaging with normal direction of blood flow towards the liver. Other: Small amount of ascites. IMPRESSION: 1. Distended gallbladder with mild diffuse wall thickening and small pericholecystic free fluid. Small amount of sludge and calculi noted. Sonographic Murphy's sign was negative as per the technologist. Findings are equivocal for acute cholecystitis. If clinically warranted, nuclear medicine hepatobiliary scan may be helpful for further evaluation. 2. Small amount of ascites. Electronically Signed   By: Jules Schick M.D.   On: 07/28/2023 07:51   US Abdomen Complete  Result Date: 07/26/2023 CLINICAL DATA:  abd pain, elevated LFTs, anorexia EXAM: ABDOMEN ULTRASOUND COMPLETE COMPARISON:  None Available. FINDINGS: Gallbladder: There is a shadowing stone at the neck of the gallbladder. There is gallbladder wall thickening and pericholecystic fluid. Common bile duct: Diameter: 0.3cm. Liver: No focal lesion identified. Normal homogeneous echogenicity. Hepatopetal portal vein. IVC: No abnormality visualized. Pancreas: Visualized portion unremarkable. Spleen: Size and appearance within normal limits. Right Kidney: Length: 12.1 cm. Echogenicity within normal limits. No mass or hydronephrosis visualized. Left Kidney: Length: 14.8cm. Echogenicity within normal limits. No mass or hydronephrosis visualized.  Abdominal aorta: No aneurysm visualized. IMPRESSION: Findings suggest acute cholecystitis. HIDA scan could be done for confirmation, if indicated. Electronically Signed   By: Layla Maw M.D.   On: 07/26/2023 21:34   DG Chest 2 View  Result Date: 07/05/2023 CLINICAL DATA:  Cough, fever, rales left lower lung. EXAM: CHEST - 2 VIEW COMPARISON:  AP chest 03/16/2019, chest two views 01/25/2008 FINDINGS: Status post median sternotomy. Cardiac silhouette is again mildly enlarged. Mediastinal contours are within normal limits. On frontal view there appears to be a left retrocardiac opacity, and there is mild heterogeneous opacification overlying the posterior lower lung on lateral view. No definite pleural effusion. No pneumothorax. Moderate multilevel degenerative disc changes of the thoracic spine. IMPRESSION: Left lower lung opacity, which may represent atelectasis or pneumonia. Electronically Signed   By: Neita Garnet M.D.   On: 07/05/2023 11:52    Labs:  CBC: Recent Labs    07/05/23 0927 07/06/23 0424 07/24/23 1724 07/28/23 0657  WBC 12.1* 10.9* 13.0* 8.4  HGB 13.8 13.3 15.0 14.2  HCT 42.7 41.3 47.5 43.9  PLT 167 149* 155 177    COAGS: Recent Labs    07/05/23 0927  INR 1.3*  APTT 32    BMP: Recent Labs    07/05/23 0927 07/06/23 0424 07/24/23 1724 07/28/23 0751  NA 134* 135 138 136  K 3.6 3.9 3.9 3.5  CL 100 101 102 105  CO2 17* 22 21* 23  GLUCOSE 192* 159* 184* 114*  BUN 21 28* 24* 18  CALCIUM 8.7* 8.5* 8.8* 8.3*  CREATININE 0.83 0.99 0.73 0.68  GFRNONAA >60 >60 >60 >60    LIVER FUNCTION TESTS: Recent Labs    07/05/23 0927 07/24/23 1724 07/28/23 0751  BILITOT 3.1* 2.7* 1.2  AST 23 110* 513*  ALT 29 141* 463*  ALKPHOS 73 99 168*  PROT 7.1 7.5 7.1  ALBUMIN 3.8 3.6 2.9*    TUMOR MARKERS: No results for input(s): "AFPTM", "CEA", "CA199", "CHROMGRNA" in the last 8760 hours.  Assessment and Plan:  72 y/o M with 2 week history of abdominal pain, n/v,  chills found to have perforated cholecystitis. General surgery has deemed patient sub-optima surgical candidate at this time and IR has been consulted for percutaneous cholecystostomy placement.   Patient history and imaging reviewed by Dr. Milford Cage who approves procedure.  Last PO 0630 today (glass of milk) On ASA 81 mg every day at home with last dose today. Patient is aware of slightly increased bleeding risk with this procedure while on ASA and is agreeable to proceed.  Risks and benefits discussed with the patient including, but not limited to bleeding, infection, gallbladder perforation, bile leak, sepsis or even death.  All of the patient's questions were answered, patient is agreeable to proceed.  Consent signed and in chart.  Post cholecystostomy placement recommendations are as follows:  Percutaneous cholecystostomy drain to remain in place at least 6 weeks.   Recommend fluoroscopy with injection of the drain in IR to evaluate for patency of the cystic duct.  If the duct is patent and general surgery feels patient is stable for cholecystectomy, the drain would be removed at time of surgery.  If the duct is patent and general surgery feels patient is NEVER a candidate for cholecystectomy, drain can be capped for a trial.  If symptoms recur, then place to gravity bag again.  If trial is successful, discuss possible removal of the drain.  If trial in unsuccessful, then patient will need routine exchanges of the  chole tube about every 8-10 weeks.  Please call the IR PA at [phone] when patient is about to be discharged and we will arrange the follow up drain injection (ok to leave message).   Thank you for this interesting consult.  I greatly enjoyed meeting Alexander [name] and look forward to participating in their care.  A copy of this report was sent to the requesting provider on this date.  Electronically Signed: Villa Herb, PA-C 07/28/2023, 1:51 PM   I spent a  total of 55 Miinutes in face to face in clinical consultation, greater than 50% of which was counseling/coordinating care for perforated cholecystitis.

## 2023-07-28 NOTE — Progress Notes (Signed)
Patient clinically stable post 10 FR Cholecystostomy tube placement per Dr Mugweru,tolerated well. Vitals stable pre and post procedure. Received Versed 2 mg along with Fentanyl 100 mcg IV for procedure. Awake/alert and oriented post procedure. Report given to care nurse [phone] at bedside with questions answered. Update given to daughter/wife when they arrived regarding procedure/statue.

## 2023-07-28 NOTE — ED Provider Notes (Signed)
[facility] Provider Note    Event Date/Time   First MD Initiated Contact with Patient 07/28/23 [phone]     (approximate)   History   Chief Complaint: Abdominal Pain   HPI  Alexander [name] is a 72 y.o. male with a history of hypertension diabetes GERD CHF who comes to the ED complaining of right lower quadrant abdominal pain with nausea and vomiting, worse with eating.  Also reports no appetite.  No constipation or diarrhea.  No fever or chills.  Symptoms have been worsening over the past 2 weeks.     Physical Exam   Triage Vital Signs: ED Triage Vitals  Encounter Vitals Group     BP 07/28/23 0654 115/78     Systolic BP Percentile --      Diastolic BP Percentile --      Pulse Rate 07/28/23 0654 89     Resp 07/28/23 0654 18     Temp 07/28/23 0654 97.9 F (36.6 C)     Temp Source 07/28/23 0654 Oral     SpO2 07/28/23 0654 96 %     Weight 07/28/23 0648 240 lb (108.9 kg)     Height 07/28/23 0648 6' (1.829 m)     Head Circumference --      Peak Flow --      Pain Score 07/28/23 0648 8     Pain Loc --      Pain Education --      Exclude from Growth Chart --     Most recent vital signs: Vitals:   07/28/23 1130 07/28/23 1311  BP: 132/77   Pulse: 83   Resp: 11   Temp:  97.6 F (36.4 C)  SpO2: 98%     General: Awake, no distress.  CV:  Good peripheral perfusion.  Regular rate and rhythm Resp:  Normal effort.  Abd:  No distention.  Soft with mild right upper quadrant tenderness and right lower quadrant tenderness    ED Results / Procedures / Treatments   Labs (all labs ordered are listed, but only abnormal results are displayed) Labs Reviewed  COMPREHENSIVE METABOLIC PANEL - Abnormal; Notable for the following components:      Result Value   Glucose, Bld 114 (*)    Calcium 8.3 (*)    Albumin 2.9 (*)    AST 513 (*)    ALT 463 (*)    Alkaline Phosphatase 168 (*)    All other components within normal limits  CBC WITH  DIFFERENTIAL/PLATELET  LIPASE, BLOOD  URINALYSIS, ROUTINE W REFLEX MICROSCOPIC  ACETAMINOPHEN LEVEL     EKG    RADIOLOGY Ultrasound right upper quadrant interpreted by me, shows gallbladder wall thickening, pericholecystic fluid, concerning for cholecystitis.  CBD looks normal.  MRCP concerning for perforated cholecystitis   PROCEDURES:  Procedures   MEDICATIONS ORDERED IN ED: Medications  piperacillin-tazobactam (ZOSYN) IVPB 3.375 g (3.375 g Intravenous New Bag/Given 07/28/23 1307)  sodium chloride 0.9 % bolus 1,000 mL (1,000 mLs Intravenous New Bag/Given 07/28/23 0757)  ondansetron (ZOFRAN) injection 4 mg (4 mg Intravenous Given 07/28/23 0757)  ketorolac (TORADOL) 15 MG/ML injection 10 mg (10 mg Intravenous Given 07/28/23 0757)     IMPRESSION / MDM / ASSESSMENT AND PLAN / ED COURSE  I reviewed the triage vital signs and the nursing notes.  DDx: Cholecystitis, choledocholithiasis, pancreatitis, appendicitis, diverticulitis  Patient's presentation is most consistent with acute presentation with potential threat to life or bodily function.  Patient presents with right-sided  abdominal pain.  Reports being recently diagnosed with a gallstone.  Will obtain labs and ultrasound today.  ----------------------------------------- 1:08 PM on 07/28/2023 ----------------------------------------- Ultrasound concerning for cholecystitis.  Discussed with general surgery Dr. Aleen Campi who recommended MRCP.  The MRCP reveals evidence of perforation.  Surgery team has discussed with IR who will place a percutaneous drain for now.  Zosyn ordered.  Will plan to admit to hospitalist.  ----------------------------------------- 1:20 PM on 07/28/2023 ----------------------------------------- Case d/w with the hospitalist!       FINAL CLINICAL IMPRESSION(S) / ED DIAGNOSES   Final diagnoses:  Acute cholecystitis     Rx / DC Orders   ED Discharge Orders     None        Note:   This document was prepared using Dragon voice recognition software and may include unintentional dictation errors.   Sharman Cheek, MD 07/28/23 1320

## 2023-07-28 NOTE — Assessment & Plan Note (Signed)
Coreg 3.125 mg daily with supper resumed

## 2023-07-28 NOTE — Assessment & Plan Note (Signed)
Home aspirin 81 mg daily resume Atorvastatin not resumed on admission due to elevated LFTs, a.m. team to resume when the benefits outweigh the risk

## 2023-07-28 NOTE — Assessment & Plan Note (Addendum)
NPH (70/30), inject 25-30, BID with meals ordered as 25 units twice daily with meals Insulin SSI with HS coverage ordered

## 2023-07-28 NOTE — Assessment & Plan Note (Addendum)
Presumed secondary to acute cholecystitis with perforation Patient is status post IR percutaneous cholecystostomy and sodium chloride 1 L bolus per EDP Holding home atorvastatin, a.m. team to resume when the benefits outweigh the risk Recheck LFTs in the a.m.

## 2023-07-28 NOTE — ED Triage Notes (Signed)
Pt to triage via w/c with no distress noted; st recently dx with gallstones; unable to get an appt until 10/15; c/o rt sided abd pain accomp by N/V

## 2023-07-28 NOTE — Assessment & Plan Note (Signed)
Status post IR percutaneous cholecystostomy via interventional radiology Preliminary cultures negative. General surgery is on board-recommending elective cholecystectomy in 7-month. Started on diet Continue Zosyn per pharmacy Continue supportive care and pain management

## 2023-07-28 NOTE — Procedures (Signed)
Vascular and Interventional Radiology Procedure Note  Patient: Alexander [name] DOB: 1951-10-08 Medical Record Number: [id number] Note Date/Time: 07/28/23 2:44 PM   Performing Physician: Roanna Banning, MD Assistant(s): None  Diagnosis: Perforated cholecystitis  Procedure:  CHOLECYSTOSTOMY TUBE PLACEMENT ANTEROGRADE CHOLANGIOGRAM  Anesthesia: Conscious Sedation Complications: None Estimated Blood Loss: Minimal Specimens:  None  Findings:  Successful placement of 47F cholecystostomy tube.  Plan: Flush tube w 5 mL sterile NS q8h and record drain output qShift. Follow up for routine tube evaluation in 6 week(s).   See detailed procedure note with images in PACS. The patient tolerated the procedure well without incident or complication and was returned to Recovery in stable condition.    Roanna Banning, MD [facility]   Pager. [phone] Clinic. [phone]

## 2023-07-28 NOTE — Assessment & Plan Note (Signed)
UA with moderate leukocytosis but no bacteria seen. Patient is already on Zosyn -Encourage p.o. hydration

## 2023-07-28 NOTE — Assessment & Plan Note (Signed)
-   This complicates overall care and prognosis.  

## 2023-07-29 DIAGNOSIS — Z8673 Personal history of transient ischemic attack (TIA), and cerebral infarction without residual deficits: Secondary | ICD-10-CM

## 2023-07-29 DIAGNOSIS — E65 Localized adiposity: Secondary | ICD-10-CM

## 2023-07-29 DIAGNOSIS — R300 Dysuria: Secondary | ICD-10-CM | POA: Diagnosis not present

## 2023-07-29 DIAGNOSIS — I10 Essential (primary) hypertension: Secondary | ICD-10-CM | POA: Diagnosis not present

## 2023-07-29 DIAGNOSIS — Z794 Long term (current) use of insulin: Secondary | ICD-10-CM

## 2023-07-29 DIAGNOSIS — E11319 Type 2 diabetes mellitus with unspecified diabetic retinopathy without macular edema: Secondary | ICD-10-CM

## 2023-07-29 DIAGNOSIS — K810 Acute cholecystitis: Secondary | ICD-10-CM | POA: Diagnosis not present

## 2023-07-29 DIAGNOSIS — R7989 Other specified abnormal findings of blood chemistry: Secondary | ICD-10-CM

## 2023-07-29 DIAGNOSIS — K82A2 Perforation of gallbladder in cholecystitis: Secondary | ICD-10-CM | POA: Diagnosis not present

## 2023-07-29 LAB — HEPATIC FUNCTION PANEL
ALT: 378 U/L — ABNORMAL HIGH (ref 0–44)
AST: 363 U/L — ABNORMAL HIGH (ref 15–41)
Albumin: 2.6 g/dL — ABNORMAL LOW (ref 3.5–5.0)
Alkaline Phosphatase: 151 U/L — ABNORMAL HIGH (ref 38–126)
Bilirubin, Direct: 0.4 mg/dL — ABNORMAL HIGH (ref 0.0–0.2)
Indirect Bilirubin: 1.3 mg/dL — ABNORMAL HIGH (ref 0.3–0.9)
Total Bilirubin: 1.7 mg/dL — ABNORMAL HIGH (ref 0.3–1.2)
Total Protein: 6.4 g/dL — ABNORMAL LOW (ref 6.5–8.1)

## 2023-07-29 LAB — CBC
HCT: 40.7 % (ref 39.0–52.0)
Hemoglobin: 13.5 g/dL (ref 13.0–17.0)
MCH: 30.3 pg (ref 26.0–34.0)
MCHC: 33.2 g/dL (ref 30.0–36.0)
MCV: 91.5 fL (ref 80.0–100.0)
Platelets: 158 10*3/uL (ref 150–400)
RBC: 4.45 MIL/uL (ref 4.22–5.81)
RDW: 14 % (ref 11.5–15.5)
WBC: 5.1 10*3/uL (ref 4.0–10.5)
nRBC: 0 % (ref 0.0–0.2)

## 2023-07-29 LAB — BASIC METABOLIC PANEL
Anion gap: 9 (ref 5–15)
BUN: 15 mg/dL (ref 8–23)
CO2: 22 mmol/L (ref 22–32)
Calcium: 8 mg/dL — ABNORMAL LOW (ref 8.9–10.3)
Chloride: 105 mmol/L (ref 98–111)
Creatinine, Ser: 0.6 mg/dL — ABNORMAL LOW (ref 0.61–1.24)
GFR, Estimated: >60 mL/min (ref >60)
Glucose, Bld: 110 mg/dL — ABNORMAL HIGH (ref 70–99)
Potassium: 3.5 mmol/L (ref 3.5–5.1)
Sodium: 136 mmol/L (ref 135–145)

## 2023-07-29 LAB — GLUCOSE, CAPILLARY
Glucose-Capillary: 120 mg/dL — ABNORMAL HIGH (ref 70–99)
Glucose-Capillary: 110 mg/dL — ABNORMAL HIGH (ref 70–99)
Glucose-Capillary: 134 mg/dL — ABNORMAL HIGH (ref 70–99)
Glucose-Capillary: 100 mg/dL — ABNORMAL HIGH (ref 70–99)

## 2023-07-29 LAB — HEMOGLOBIN A1C
Hgb A1c MFr Bld: 6.7 % — ABNORMAL HIGH (ref 4.8–5.6)
Mean Plasma Glucose: 145.59 mg/dL

## 2023-07-29 NOTE — Progress Notes (Signed)
Mobility Specialist - Progress Note     07/29/23 1707  Mobility  Activity Ambulated with assistance in hallway  Level of Assistance Contact guard assist, steadying assist  Assistive Device None;Other (Comment) (IV pole hold)  Distance Ambulated (ft) 160 ft  Range of Motion/Exercises Active  Activity Response Tolerated well  Mobility Referral Yes  $Mobility charge 1 Mobility  Mobility Specialist Start Time (ACUTE ONLY) 1645  Mobility Specialist Stop Time (ACUTE ONLY) 1700  Mobility Specialist Time Calculation (min) (ACUTE ONLY) 15 min   Pt resting in bed on RA upon entry. Pt STS and ambulates to hallway toward elevators CGA holding onto IV pole. Pt endorses no pain but, close CGA due to a little imbalance during walking. Pt returned to bed and left EOB to eat dinner with needs in reach.   Johnathan Hausen Mobility Specialist 07/29/23, 5:16 PM

## 2023-07-29 NOTE — Progress Notes (Signed)
Progress Note   Patient: Alexander [name]:[id number] DOB: 1951/08/02 DOA: 07/28/2023     1 DOS: the patient was seen and examined on 07/29/2023   Brief hospital course: Mr. Devonaire Shingleton is a patient with CAD status post CABG in 2017, heart failure reduced ejection fraction, insulin-dependent diabetes mellitus, hypertension, hyperlipidemia, who presents to the emergency department for chief concerns of right-sided abdominal pain in setting of recent diagnosis of gallstones. Per ED note, patient endorses nausea, vomiting.  Patient was having RUQ pain for about 3-week, initially presented to ED on 9/26 but left without seeing, then admitted at [facility] on 10/2 but left AMA.  Initial vitals in the ED showed temperature 97.6, respiration rate of 14, heart rate of 88, blood pressure 132/77, SpO2 95% on room air.  Serum sodium is 136, potassium 3.5, chloride 105, bicarb 23, BUN of 18, serum creatinine of 0.68, EGFR greater than 60, nonfasting blood glucose 114, WBC 8.4, hemoglobin 14.2, platelets of 177.  MRI of abdominal MRCP wo contrast: Was read as cholelithiasis.  Mild diffuse gallbladder wall thickening and large amount of pericholecystic fluid.  Small focal discontinuity in the anterior gallbladder wall body wall.  Findings concerning for perforated acute cholecystitis.  Unilocular simple appearing cystic pancreatic lesion, largest 0.4 cm in pancreatic body.  Recommend MR RI of the abdomen without contrast and with IV contrast in 2 years.  ED treatment: Toradol 10 mg IV, ondansetron 4 mg IV, Zosyn 3.375 g IV, sodium chloride 1 L bolus.  EDP consulted with general surgery and IR for percutaneous drain placement.  10/5: Vital stable, s/p cholecystostomy and drain placement by IR on 10//24, preliminary blood and drain fluid cultures negative.  Persistent transaminitis with small improvement, T. bili at 1.7 Started on diet by general surgery.  Assessment and Plan: * Acute  cholecystitis Status post IR percutaneous cholecystostomy via interventional radiology Preliminary cultures negative. General surgery is on board-recommending elective cholecystectomy in 34-month. 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 Patient is already on Zosyn -Encourage p.o. hydration  Elevated LFTs Presumed secondary to acute cholecystitis with perforation Patient is status post IR percutaneous cholecystostomy and sodium chloride 1 L bolus per EDP Holding home atorvastatin, a.m. team to resume when the benefits outweigh the risk Recheck LFTs in the a.m.  History of CVA (cerebrovascular accident) Home aspirin 81 mg daily resume Atorvastatin not resumed on admission due to elevated LFTs,   Truncal obesity This complicates overall care and prognosis.    Subjective: Patient continued to have some right-sided mostly in right upper quadrant discomfort stating that it is much better than before.  Significant bloody discharge in drain  Physical Exam: Vitals:   07/28/23 1606 07/28/23 1946 07/29/23 0401 07/29/23 0803  BP: 123/69 111/67 (!) 114/57 121/65  Pulse: 84 92 84 82  Resp: 16 20 20 18   Temp: 98.6 F (37 C) 98.5 F (36.9 C) 97.8 F (36.6 C) 98.1 F (36.7 C)  TempSrc:  Oral Oral Oral  SpO2: 94% 95% 96% 97%  Weight:      Height:       General.  Obese gentleman, in no acute distress. Pulmonary.  Lungs clear bilaterally, normal respiratory effort. CV.  Regular rate and rhythm, no JVD, rub or murmur. Abdomen.  Soft, mild RUQ tender, nondistended, BS positive.  Right-sided drain in place with bloody discharge CNS.  Alert and oriented .  No focal neurologic  deficit. Extremities.  No edema, no cyanosis, pulses intact and symmetrical. Psychiatry.  Judgment and insight appears normal.   Data Reviewed: Prior data reviewed  Family Communication: Tried calling wife and daughter with no response.  Disposition: Status is: Inpatient Remains inpatient appropriate because: Severity of illness  Planned Discharge Destination: Home  DVT prophylaxis.  Subcu heparin Time spent: 50 minutes  This record has been created using Conservation officer, historic buildings. Errors have been sought and corrected,but may not always be located. Such creation errors do not reflect on the standard of care.   Author: Arnetha Courser, MD 07/29/2023 2:46 PM  For on call review [email or web address].

## 2023-07-29 NOTE — Progress Notes (Signed)
07/29/2023  Subjective: No acute events overnight.  Patient had a percutaneous cholecystostomy drain placement yesterday for possibly perforated cholecystitis with significant amount of right upper quadrant inflammatory changes.  Patient reports that he is feeling better today and would like his diet advanced.  He has tolerated clear liquid diet without any issues.  Vital signs: Temp:  [97.6 F (36.4 C)-98.6 F (37 C)] 98.1 F (36.7 C) (10/05 0803) Pulse Rate:  [0-97] 82 (10/05 0803) Resp:  [0-23] 18 (10/05 0803) BP: (111-148)/(57-96) 121/65 (10/05 0803) SpO2:  [94 %-98 %] 97 % (10/05 0803)   Intake/Output: 10/04 0701 - 10/05 0700 In: 111.9 [I.V.:5; IV Piggyback:96.9] Out: 60 [Drains:60] Last BM Date : 07/27/23  Physical Exam: Constitutional: No acute distress Abdomen: Soft, nondistended, appropriately sore to palpation.  Negative Murphy's sign.  Right upper quadrant drain with some blood-tinged bilious fluid.  Labs:  Recent Labs    07/28/23 0657 07/29/23 0517  WBC 8.4 5.1  HGB 14.2 13.5  HCT 43.9 40.7  PLT 177 158   Recent Labs    07/28/23 0751 07/29/23 0517  NA 136 136  K 3.5 3.5  CL 105 105  CO2 23 22  GLUCOSE 114* 110*  BUN 18 15  CREATININE 0.68 0.60*  CALCIUM 8.3* 8.0*   Recent Labs    07/28/23 1554  LABPROT 15.5*  INR 1.2    Imaging: IR Perc Cholecystostomy  Result Date: 07/28/2023 INDICATION: Acute perforated cholecystitis EXAM: ULTRASOUND AND FLUOROSCOPIC-GUIDED CHOLECYSTOSTOMY TUBE PLACEMENT COMPARISON:  US Abdomen and MRCP, 07/28/2023. MEDICATIONS: The patient is currently admitted to the hospital and on intravenous antibiotics. Antibiotics were administered within an appropriate time frame prior to skin puncture. ANESTHESIA/SEDATION: Moderate (conscious) sedation was employed during this procedure. A total of Versed 2 mg and Fentanyl 100 mcg was administered intravenously. Moderate Sedation Time: 16 minutes. The patient's level of consciousness and  vital signs were monitored continuously by radiology nursing throughout the procedure under my direct supervision. CONTRAST:  8mL OMNIPAQUE IOHEXOL 300 MG/ML SOLN - administered into the gallbladder fossa. FLUOROSCOPY TIME:  Fluoroscopic dose; 17 mGy COMPLICATIONS: None immediate. PROCEDURE: Informed written consent was obtained from the patient and/or patient's representative after a discussion of the risks, benefits and alternatives to treatment. Questions regarding the procedure were encouraged and answered. A timeout was performed prior to the initiation of the procedure. The right upper abdominal quadrant was prepped and draped in the usual sterile fashion, and a sterile drape was applied covering the operative field. Maximum barrier sterile technique with sterile gowns and gloves were used for the procedure. A timeout was performed prior to the initiation of the procedure. Local anesthesia was provided with 1% lidocaine with epinephrine. Ultrasound scanning of the right upper quadrant demonstrates a markedly dilated gallbladder. Of note, the patient reported pain with ultrasound imaging over the gallbladder. Utilizing a transhepatic approach, a 22 gauge needle was advanced into the gallbladder under direct ultrasound guidance. An ultrasound image was saved for documentation purposes. Appropriate intraluminal puncture was confirmed with the efflux of bile and advancement of an 0.018 wire into the gallbladder lumen. The needle was exchanged for an Accustick set. A small amount of contrast was injected to confirm appropriate intraluminal positioning. Over a Benson wire, a 10 Fr cholecystomy tube was advanced into the gallbladder fossa, coiled and locked. Bile was aspirated and a small amount of contrast was injected as several post procedural spot radiographic images were obtained in various obliquities. The catheter was secured to the skin with suture,  connected to a drainage bag and a dressing was placed. The  patient tolerated the procedure well without immediate post procedural complication. IMPRESSION: Successful ultrasound and fluoroscopic guided placement of a 10 Fr cholecystostomy tube. RECOMMENDATIONS: The patient will return to Vascular Interventional Radiology (VIR) for routine drainage catheter evaluation and exchange in 6 weeks. Roanna Banning, MD [facility]iology [facility]iology Electronically Signed   By: Roanna Banning M.D.   On: 07/28/2023 15:55   MR 3D Recon At Scanner  Result Date: 07/28/2023 CLINICAL DATA:  Nonspecific (abnormal) findings on radiological and other examination of musculoskeletal system EXAM: 3-DIMENSIONAL MR IMAGE RENDERING ON ACQUISITION WORKSTATION TECHNIQUE: 3-dimensional MR images were rendered by post-processing of the original MR data on an acquisition workstation. The 3-dimensional MR images were interpreted and findings were reported in the accompanying complete MR report for this study COMPARISON:  07/28/2023 abdominal sonogram FINDINGS: Please see the separate concurrent report for the MRI abdomen without contrast including MRCP for details. IMPRESSION: Please see the separate concurrent report for the MRI abdomen without contrast including MRCP for details. Electronically Signed   By: Delbert Phenix M.D.   On: 07/28/2023 13:30   MR ABDOMEN MRCP WO CONTRAST  Result Date: 07/28/2023 CLINICAL DATA:  Severe right lower abdominal pain.  Cholelithiasis. EXAM: MRI ABDOMEN WITHOUT CONTRAST  (INCLUDING MRCP) TECHNIQUE: Multiplanar multisequence MR imaging of the abdomen was performed. Heavily T2-weighted images of the biliary and pancreatic ducts were obtained, and three-dimensional MRCP images were rendered by post processing. COMPARISON:  07/28/2023 right upper quadrant abdominal sonogram. 03/17/2019 CT abdomen/pelvis. FINDINGS: Lower chest: No acute abnormality at the lung bases. Hepatobiliary: Normal liver size and configuration. No hepatic  steatosis. No liver mass. Mildly distended gallbladder. Mild diffuse gallbladder wall thickening. Large amount of pericholecystic fluid. There is a suggestion of a small focal discontinuity in the anterior gallbladder wall body wall on series 14/image 24 and series 16/image 13, cannot exclude gallbladder perforation. Small loss of layering sludge in the gallbladder with 8 mm stone in the gallbladder neck region. No biliary ductal dilatation. Common bile duct diameter 3 mm. No choledocholithiasis. No biliary masses, strictures or beading. Pancreas: No pancreatic duct dilation. Several tiny unilocular simple appearing cystic pancreatic lesions scattered throughout the pancreatic head, neck and body, largest 0.4 cm in the pancreatic body on series 14/image 31. No pancreas divisum. Spleen: Normal size. No mass. Adrenals/Urinary Tract: Normal adrenals. No hydronephrosis. Normal kidneys with no renal mass. Stomach/Bowel: Normal non-distended stomach. Visualized small and large bowel is normal caliber, with no bowel wall thickening. Vascular/Lymphatic: Normal caliber abdominal aorta. No pathologically enlarged lymph nodes in the abdomen. Other: No abdominal ascites or focal fluid collection. Musculoskeletal: No aggressive appearing focal osseous lesions. IMPRESSION: 1. Cholelithiasis. Mild diffuse gallbladder wall thickening and large amount of pericholecystic fluid. Suggestion of a small focal discontinuity in the anterior gallbladder wall body wall. Findings are concerning for perforated acute cholecystitis. 2. No biliary ductal dilatation. No choledocholithiasis. 3. 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 No pancreatic duct dilation. Follow-up MRI abdomen without and with IV contrast recommended in 2 years. This recommendation follows ACR consensus guidelines: Management of Incidental Pancreatic Cysts: A White Paper of the ACR  Incidental Findings Committee. J Am Coll Radiol 2017;14:911-923. Electronically Signed   By: Delbert Phenix M.D.   On: 07/28/2023 12:41    Assessment/Plan: This is a 72 y.o. male with possible perforated cholecystitis with significant  right upper quadrant inflammatory changes, status post percutaneous cholecystostomy drain placement.  - Patient is doing remarkably well for the degree of inflammation changes in his right upper quadrant.  Denies any worsening pain he tolerated clear liquid diet.  Will advance to a full liquid diet for lunch and then a cardiac/diabetic diet for dinner if he is doing well. - For now continue IV antibiotics.  Biliary cultures currently showing no growth. - Potentially could be ready to discharge home as early as tomorrow afternoon if he is doing well.  Discussed with the patient that his LFTs have somewhat improved but we will repeat them tomorrow again to continue checking. - Discussed with him again eventual plan for interval cholecystectomy.   I spent 35 minutes dedicated to the care of this patient on the date of this encounter to include pre-visit review of records, face-to-face time with the patient discussing diagnosis and management, and any post-visit coordination of care.  Howie Ill, MD [facility]

## 2023-07-30 DIAGNOSIS — R7989 Other specified abnormal findings of blood chemistry: Secondary | ICD-10-CM | POA: Diagnosis not present

## 2023-07-30 DIAGNOSIS — K82A2 Perforation of gallbladder in cholecystitis: Secondary | ICD-10-CM | POA: Diagnosis not present

## 2023-07-30 DIAGNOSIS — I10 Essential (primary) hypertension: Secondary | ICD-10-CM | POA: Diagnosis not present

## 2023-07-30 DIAGNOSIS — E11319 Type 2 diabetes mellitus with unspecified diabetic retinopathy without macular edema: Secondary | ICD-10-CM | POA: Diagnosis not present

## 2023-07-30 DIAGNOSIS — K810 Acute cholecystitis: Secondary | ICD-10-CM | POA: Diagnosis not present

## 2023-07-30 LAB — COMPREHENSIVE METABOLIC PANEL
ALT: 307 U/L — ABNORMAL HIGH (ref 0–44)
AST: 271 U/L — ABNORMAL HIGH (ref 15–41)
Albumin: 2.6 g/dL — ABNORMAL LOW (ref 3.5–5.0)
Alkaline Phosphatase: 174 U/L — ABNORMAL HIGH (ref 38–126)
Anion gap: 9 (ref 5–15)
BUN: 15 mg/dL (ref 8–23)
CO2: 23 mmol/L (ref 22–32)
Calcium: 8.2 mg/dL — ABNORMAL LOW (ref 8.9–10.3)
Chloride: 104 mmol/L (ref 98–111)
Creatinine, Ser: 0.57 mg/dL — ABNORMAL LOW (ref 0.61–1.24)
GFR, Estimated: >60 mL/min (ref >60)
Glucose, Bld: 83 mg/dL (ref 70–99)
Potassium: 3.6 mmol/L (ref 3.5–5.1)
Sodium: 136 mmol/L (ref 135–145)
Total Bilirubin: 1 mg/dL (ref 0.3–1.2)
Total Protein: 6.4 g/dL — ABNORMAL LOW (ref 6.5–8.1)

## 2023-07-30 LAB — CBC
HCT: 40.4 % (ref 39.0–52.0)
Hemoglobin: 13.4 g/dL (ref 13.0–17.0)
MCH: 30.1 pg (ref 26.0–34.0)
MCHC: 33.2 g/dL (ref 30.0–36.0)
MCV: 90.8 fL (ref 80.0–100.0)
Platelets: 173 10*3/uL (ref 150–400)
RBC: 4.45 MIL/uL (ref 4.22–5.81)
RDW: 13.9 % (ref 11.5–15.5)
WBC: 6.1 10*3/uL (ref 4.0–10.5)
nRBC: 0 % (ref 0.0–0.2)

## 2023-07-30 LAB — GLUCOSE, CAPILLARY
Glucose-Capillary: 208 mg/dL — ABNORMAL HIGH (ref 70–99)
Glucose-Capillary: 198 mg/dL — ABNORMAL HIGH (ref 70–99)

## 2023-07-30 MED ORDER — POLYETHYLENE GLYCOL 3350 17 G PO PACK: 17.0000 g | PACK | Freq: Two times a day (BID) | ORAL | 0 refills | Status: DC | PRN

## 2023-07-30 MED ORDER — SODIUM CHLORIDE 0.9% FLUSH: 5.0000 mL | Freq: Every day | INTRAVENOUS | 1 refills | Status: DC

## 2023-07-30 MED ORDER — ATORVASTATIN CALCIUM 80 MG PO TABS: 80.0000 mg | ORAL_TABLET | Freq: Every day | ORAL | Status: AC

## 2023-07-30 MED ORDER — ONDANSETRON HCL 4 MG PO TABS: 4.0000 mg | ORAL_TABLET | Freq: Four times a day (QID) | ORAL | 0 refills | Status: DC | PRN

## 2023-07-30 MED ORDER — AMOXICILLIN-POT CLAVULANATE 875-125 MG PO TABS: 1.0000 | ORAL_TABLET | Freq: Two times a day (BID) | ORAL | 0 refills | Status: AC

## 2023-07-30 MED ORDER — ENSURE ENLIVE PO LIQD: 237.0000 mL | Freq: Two times a day (BID) | ORAL | 12 refills | Status: DC

## 2023-07-30 NOTE — Progress Notes (Signed)
07/30/2023  Subjective: No acute events.  Patient feels well and denies any worsening pain.  Tolerating diet.  Cultures currently without growth.  Vital signs: Temp:  [98 F (36.7 C)-98.4 F (36.9 C)] 98.4 F (36.9 C) (10/06 0927) Pulse Rate:  [82-84] 83 (10/06 0927) Resp:  [17-18] 18 (10/06 0420) BP: (106-119)/(59-73) 119/73 (10/06 0927) SpO2:  [97 %-98 %] 97 % (10/06 9518)   Intake/Output: 10/05 0701 - 10/06 0700 In: 838.1 [P.O.:720; IV Piggyback:103.1] Out: 665 [Urine:600; Drains:65] Last BM Date : 07/29/23  Physical Exam: Constitutional:  No acute distress Abdomen:  soft, non-distended, appropriately sore to palpation at the drain insertion site.  Drain with bilious fluid.  Labs:  Recent Labs    07/29/23 0517 07/30/23 0442  WBC 5.1 6.1  HGB 13.5 13.4  HCT 40.7 40.4  PLT 158 173   Recent Labs    07/29/23 0517 07/30/23 0442  NA 136 136  K 3.5 3.6  CL 105 104  CO2 22 23  GLUCOSE 110* 83  BUN 15 15  CREATININE 0.60* 0.57*  CALCIUM 8.0* 8.2*   Recent Labs    07/28/23 1554  LABPROT 15.5*  INR 1.2    Imaging: No results found.  Assessment/Plan: This is a 72 y.o. male with acute cholecystitis with suspected perforation, s/p percutaneous cholecystostomy drain placement.  --Patient continues to do well.  His WBC remains normal, LFTs improving today.   --OK to d/c home from surgical standpoint.  Have sent Rx for Augmentin course and printed Rx for saline flushes. --Follow up with me in 2-3 weeks.   I spent 35 minutes dedicated to the care of this patient on the date of this encounter to include pre-visit review of records, face-to-face time with the patient discussing diagnosis and management, and any post-visit coordination of care.  Howie Ill, MD [facility]

## 2023-07-30 NOTE — Discharge Summary (Signed)
Physician Discharge Summary   Patient: Alexander [name] MRN: [id number] DOB: [birth year]  Admit date:     07/28/2023  Discharge date: 07/30/23  Discharge Physician: Arnetha Courser   PCP: Myrene Buddy, NP   Recommendations at discharge:  Please obtain CBC and CMP on follow-up Patient is being given 10 days of Augmentin-please ensure the completion of course Patient was told to hold Lipitor, please restart once transaminitis resolved. 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 Daughtry Dobek is a patient with CAD status post CABG in 2017, heart failure reduced ejection fraction, insulin-dependent diabetes mellitus, hypertension, hyperlipidemia, who presents to the emergency department for chief concerns of right-sided abdominal pain in setting of recent diagnosis of gallstones. Per ED note, patient endorses nausea, vomiting.  Patient was having RUQ pain for about 3-week, initially presented to ED on 9/26 but left without seeing, then admitted at [facility] on 10/2 but left AMA.  Initial vitals in the ED showed temperature 97.6, respiration rate of 14, heart rate of 88, blood pressure 132/77, SpO2 95% on room air.  Serum sodium is 136, potassium 3.5, chloride 105, bicarb 23, BUN of 18, serum creatinine of 0.68, EGFR greater than 60, nonfasting blood glucose 114, WBC 8.4, hemoglobin 14.2, platelets of 177.  MRI of abdominal MRCP wo contrast: Was read as  cholelithiasis.  Mild diffuse gallbladder wall thickening and large amount of pericholecystic fluid.  Small focal discontinuity in the anterior gallbladder wall body wall.  Findings concerning for perforated acute cholecystitis.  Unilocular simple appearing cystic pancreatic lesion, largest 0.4 cm in pancreatic body.  Recommend MR RI of the abdomen without contrast and with IV contrast in 2 years.  ED treatment: Toradol 10 mg IV, ondansetron 4 mg IV, Zosyn 3.375 g IV, sodium chloride 1 L bolus.  EDP consulted with general surgery and IR for percutaneous drain placement.  10/5: Vital stable, s/p cholecystostomy and drain placement by IR on 10//24, preliminary blood and drain fluid cultures negative.  Persistent transaminitis with small improvement, T. bili at 1.7 Started on diet by general surgery.  10/6: Vital stable.  Cultures remain negative, slowly improving transaminitis.  Patient with no pain and having bowel movement.  Tolerating diet well.  General surgery is recommending 10 more days of antibiotics with Augmentin.  Patient was instructed to seek medical attention if develop new pain or fever.  Will be high risk for developing any other intra-abdominal abscess due to perforated gallbladder.  Patient will be followed up by general surgery and they will plan elective cholecystectomy.  Patient was also instructed to hold Lipitor as continued to have transaminitis although improving, he need to have an repeat labs done on follow-up and his physician can restart once appropriate.  Patient is being discharged with drain in place, instructions were provided to flush with normal saline.  Patient will follow-up with drain clinic for further recommendations.  Patient will continue the rest of his home medications as he was doing it before and follow-up  with his providers for further recommendations.  Assessment and Plan: * Acute cholecystitis Status post IR percutaneous cholecystostomy via  interventional radiology Preliminary cultures negative. General surgery is on board-recommending elective cholecystectomy in 19-month. No pain and tolerating diet.  Having bowel movement.  General surgery switched him to Augmentin as cultures remain negative.  Patient will follow-up with general surgery and drain clinic.  Hypertension Coreg 3.125 mg daily with supper resumed  Diabetes mellitus (HCC) NPH (70/30), inject 25-30, BID with meals ordered as 25 units twice daily with meals Insulin SSI with HS coverage ordered  Dysuria UA with moderate leukocytosis but no bacteria seen. Patient is already on Zosyn -Encourage p.o. hydration  Elevated LFTs Presumed secondary to acute cholecystitis with perforation Patient is status post IR percutaneous cholecystostomy and sodium chloride 1 L bolus per EDP Holding home atorvastatin, a.m. team to resume when the benefits outweigh the risk Recheck LFTs in the a.m.  History of CVA (cerebrovascular accident) Home aspirin 81 mg daily resume Atorvastatin not resumed on admission due to elevated LFTs,   Truncal obesity This complicates overall care and prognosis.    Consultants: General surgery.  Interventional radiology Procedures performed: Cholecystostomy drain placement with IR Disposition: Home Diet recommendation:  Discharge Diet Orders (From admission, onward)     Start     Ordered   07/30/23 0000  Diet - low sodium heart healthy        07/30/23 1049           Cardiac and Carb modified diet DISCHARGE MEDICATION: Allergies as of 07/30/2023   No Known Allergies      Medication List     STOP taking these medications    insulin NPH-regular Human (70-30) 100 UNIT/ML injection       TAKE these medications    acetaminophen 500 MG tablet Commonly known as: TYLENOL Take 1,000 mg by mouth every 6 (six) hours as needed for mild pain or headache.   amoxicillin-clavulanate 875-125 MG tablet Commonly known as:  AUGMENTIN Take 1 tablet by mouth 2 (two) times daily for 10 days.   aspirin EC 81 MG tablet Take 81 mg by mouth daily.   atorvastatin 80 MG tablet Commonly known as: LIPITOR Take 1 tablet (80 mg total) by mouth daily. Hold until you see your doctor and have your liver enzymes repeated What changed: additional instructions   benzonatate 100 MG capsule Commonly known as: Tessalon Perles Take 1 capsule (100 mg total) by mouth 3 (three) times daily as needed for cough.   carvedilol 3.125 MG tablet Commonly known as: COREG Take 3.125 mg by mouth daily with supper.   dextromethorphan-guaiFENesin 30-600 MG 12hr tablet Commonly known as: MUCINEX DM Take 1 tablet by mouth 2 (two) times daily as needed for cough.   empagliflozin 25 MG Tabs tablet Commonly known as: JARDIANCE Take 25 mg by mouth daily.   feeding supplement Liqd Take 237 mLs by mouth 2 (two) times daily between meals.   Lantus SoloStar 100 UNIT/ML Solostar Pen Generic drug: insulin glargine Inject 45 Units into the skin at bedtime.   ondansetron 4 MG tablet Commonly known as: ZOFRAN Take 1 tablet (4 mg total) by mouth every 6 (six) hours as needed for nausea.   Ozempic (2 MG/DOSE) 8 MG/3ML Sopn Generic drug: Semaglutide (2 MG/DOSE) Inject 2 mg into the skin once a week. Tuesday   polyethylene glycol 17 g packet Commonly known as: MIRALAX / GLYCOLAX Take 17 g by mouth 2 (two) times daily as  needed for moderate constipation.   sacubitril-valsartan 24-26 MG Commonly known as: ENTRESTO Take 1 tablet by mouth every 12 (twelve) hours.   senna 8.6 MG Tabs tablet Commonly known as: SENOKOT Take 1 tablet (8.6 mg total) by mouth at bedtime as needed for mild constipation.   sodium chloride flush 0.9 % Soln Commonly known as: NS 5 mLs by Intracatheter route daily.               Discharge Care Instructions  (From admission, onward)           Start     Ordered   07/30/23 0000  Discharge wound care:        Comments: Please change dressing as needed.   07/30/23 1049            Follow-up Information     Piscoya, Elita Quick, MD Follow up in 3 week(s).   Specialty: General Surgery Contact information: [address] [place] [address] [phone]         Bayard Males Hermenia Fiscal, NP. Schedule an appointment as soon as possible for a visit in 1 week(s).   Specialty: Internal Medicine Contact information: [address] [facility] - PRIMARY CARE [place] [address] [phone]                Discharge Exam: Ceasar Mons Weights   07/28/23 0648  Weight: 108.9 kg   General.  Well-developed elderly man, in no acute distress. Pulmonary.  Lungs clear bilaterally, normal respiratory effort. CV.  Regular rate and rhythm, no JVD, rub or murmur. Abdomen.  Soft, nontender, nondistended, BS positive.  Right-sided drain in place. CNS.  Alert and oriented .  No focal neurologic deficit. Extremities.  No edema, no cyanosis, pulses intact and symmetrical. Psychiatry.  Judgment and insight appears normal.   Condition at discharge: stable  The results of significant diagnostics from this hospitalization (including imaging, microbiology, ancillary and laboratory) are listed below for reference.   Imaging Studies: IR Perc Cholecystostomy  Result Date: 07/28/2023 INDICATION: Acute perforated cholecystitis EXAM: ULTRASOUND AND FLUOROSCOPIC-GUIDED CHOLECYSTOSTOMY TUBE PLACEMENT COMPARISON:  US Abdomen and MRCP, 07/28/2023. MEDICATIONS: The patient is currently admitted to the hospital and on intravenous antibiotics. Antibiotics were administered within an appropriate time frame prior to skin puncture. ANESTHESIA/SEDATION: Moderate (conscious) sedation was employed during this procedure. A total of Versed 2 mg and Fentanyl 100 mcg was administered intravenously. Moderate Sedation Time: 16 minutes. The patient's level of consciousness and vital signs were monitored  continuously by radiology nursing throughout the procedure under my direct supervision. CONTRAST:  8mL OMNIPAQUE IOHEXOL 300 MG/ML SOLN - administered into the gallbladder fossa. FLUOROSCOPY TIME:  Fluoroscopic dose; 17 mGy COMPLICATIONS: None immediate. PROCEDURE: Informed written consent was obtained from the patient and/or patient's representative after a discussion of the risks, benefits and alternatives to treatment. Questions regarding the procedure were encouraged and answered. A timeout was performed prior to the initiation of the procedure. The right upper abdominal quadrant was prepped and draped in the usual sterile fashion, and a sterile drape was applied covering the operative field. Maximum barrier sterile technique with sterile gowns and gloves were used for the procedure. A timeout was performed prior to the initiation of the procedure. Local anesthesia was provided with 1% lidocaine with epinephrine. Ultrasound scanning of the right upper quadrant demonstrates a markedly dilated gallbladder. Of note, the patient reported pain with ultrasound imaging over the gallbladder. Utilizing a transhepatic approach, a 22 gauge needle was advanced into  the gallbladder under direct ultrasound guidance. An ultrasound image was saved for documentation purposes. Appropriate intraluminal puncture was confirmed with the efflux of bile and advancement of an 0.018 wire into the gallbladder lumen. The needle was exchanged for an Accustick set. A small amount of contrast was injected to confirm appropriate intraluminal positioning. Over a Benson wire, a 10 Fr cholecystomy tube was advanced into the gallbladder fossa, coiled and locked. Bile was aspirated and a small amount of contrast was injected as several post procedural spot radiographic images were obtained in various obliquities. The catheter was secured to the skin with suture, connected to a drainage bag and a dressing was placed. The patient tolerated the  procedure well without immediate post procedural complication. IMPRESSION: Successful ultrasound and fluoroscopic guided placement of a 10 Fr cholecystostomy tube. RECOMMENDATIONS: The patient will return to Vascular Interventional Radiology (VIR) for routine drainage catheter evaluation and exchange in 6 weeks. Roanna Banning, MD [facility] Radiology Electronically Signed   By: Roanna Banning M.D.   On: 07/28/2023 15:55   MR 3D Recon At Scanner  Result Date: 07/28/2023 CLINICAL DATA:  Nonspecific (abnormal) findings on radiological and other examination of musculoskeletal system EXAM: 3-DIMENSIONAL MR IMAGE RENDERING ON ACQUISITION WORKSTATION TECHNIQUE: 3-dimensional MR images were rendered by post-processing of the original MR data on an acquisition workstation. The 3-dimensional MR images were interpreted and findings were reported in the accompanying complete MR report for this study COMPARISON:  07/28/2023 abdominal sonogram FINDINGS: Please see the separate concurrent report for the MRI abdomen without contrast including MRCP for details. IMPRESSION: Please see the separate concurrent report for the MRI abdomen without contrast including MRCP for details. Electronically Signed   By: Delbert Phenix M.D.   On: 07/28/2023 13:30   MR ABDOMEN MRCP WO CONTRAST  Result Date: 07/28/2023 CLINICAL DATA:  Severe right lower abdominal pain.  Cholelithiasis. EXAM: MRI ABDOMEN WITHOUT CONTRAST  (INCLUDING MRCP) TECHNIQUE: Multiplanar multisequence MR imaging of the abdomen was performed. Heavily T2-weighted images of the biliary and pancreatic ducts were obtained, and three-dimensional MRCP images were rendered by post processing. COMPARISON:  07/28/2023 right upper quadrant abdominal sonogram. 03/17/2019 CT abdomen/pelvis. FINDINGS: Lower chest: No acute abnormality at the lung bases. Hepatobiliary: Normal liver size and configuration. No hepatic steatosis. No liver mass.  Mildly distended gallbladder. Mild diffuse gallbladder wall thickening. Large amount of pericholecystic fluid. There is a suggestion of a small focal discontinuity in the anterior gallbladder wall body wall on series 14/image 24 and series 16/image 13, cannot exclude gallbladder perforation. Small loss of layering sludge in the gallbladder with 8 mm stone in the gallbladder neck region. No biliary ductal dilatation. Common bile duct diameter 3 mm. No choledocholithiasis. No biliary masses, strictures or beading. Pancreas: No pancreatic duct dilation. Several tiny unilocular simple appearing cystic pancreatic lesions scattered throughout the pancreatic head, neck and body, largest 0.4 cm in the pancreatic body on series 14/image 31. No pancreas divisum. Spleen: Normal size. No mass. Adrenals/Urinary Tract: Normal adrenals. No hydronephrosis. Normal kidneys with no renal mass. Stomach/Bowel: Normal non-distended stomach. Visualized small and large bowel is normal caliber, with no bowel wall thickening. Vascular/Lymphatic: Normal caliber abdominal aorta. No pathologically enlarged lymph nodes in the abdomen. Other: No abdominal ascites or focal fluid collection. Musculoskeletal: No aggressive appearing focal osseous lesions. IMPRESSION: 1. Cholelithiasis. Mild diffuse gallbladder wall thickening and large amount of pericholecystic fluid. Suggestion of a small focal discontinuity in the anterior gallbladder wall body wall. Findings are  concerning for perforated acute cholecystitis. 2. No biliary ductal dilatation. No choledocholithiasis. 3. 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 No pancreatic duct dilation. Follow-up MRI abdomen without and with IV contrast recommended in 2 years. This recommendation follows ACR consensus guidelines: Management of Incidental Pancreatic Cysts: A White Paper of the ACR Incidental Findings  Committee. J Am Coll Radiol 2017;14:911-923. Electronically Signed   By: Delbert Phenix M.D.   On: 07/28/2023 12:41   US ABDOMEN LIMITED RUQ (LIVER/GB)  Result Date: 07/28/2023 CLINICAL DATA:  Right upper quadrant abdominal pain for 3 weeks. Nausea/vomiting. Fevers. EXAM: ULTRASOUND ABDOMEN LIMITED RIGHT UPPER QUADRANT COMPARISON:  CT scan abdomen and pelvis from 03/17/2019. FINDINGS: Gallbladder: Distended. There is mild diffuse wall thickening measuring up to 4 mm. Small amount of sludge and calculi noted. There is small pericholecystic free fluid. Sonographic Murphy's sign was negative as per the technologist. Common bile duct: Diameter: Less than 4 mm. Liver: The technologist noted limited evaluation of the left lobe. Otherwise, no focal lesion identified. Within normal limits in parenchymal echogenicity. Portal vein is patent on color Doppler imaging with normal direction of blood flow towards the liver. Other: Small amount of ascites. IMPRESSION: 1. Distended gallbladder with mild diffuse wall thickening and small pericholecystic free fluid. Small amount of sludge and calculi noted. Sonographic Murphy's sign was negative as per the technologist. Findings are equivocal for acute cholecystitis. If clinically warranted, nuclear medicine hepatobiliary scan may be helpful for further evaluation. 2. Small amount of ascites. Electronically Signed   By: Jules Schick M.D.   On: 07/28/2023 07:51   US Abdomen Complete  Result Date: 07/26/2023 CLINICAL DATA:  abd pain, elevated LFTs, anorexia EXAM: ABDOMEN ULTRASOUND COMPLETE COMPARISON:  None Available. FINDINGS: Gallbladder: There is a shadowing stone at the neck of the gallbladder. There is gallbladder wall thickening and pericholecystic fluid. Common bile duct: Diameter: 0.3cm. Liver: No focal lesion identified. Normal homogeneous echogenicity. Hepatopetal portal vein. IVC: No abnormality visualized. Pancreas: Visualized portion unremarkable. Spleen: Size and  appearance within normal limits. Right Kidney: Length: 12.1 cm. Echogenicity within normal limits. No mass or hydronephrosis visualized. Left Kidney: Length: 14.8cm. Echogenicity within normal limits. No mass or hydronephrosis visualized. Abdominal aorta: No aneurysm visualized. IMPRESSION: Findings suggest acute cholecystitis. HIDA scan could be done for confirmation, if indicated. Electronically Signed   By: Layla Maw M.D.   On: 07/26/2023 21:34   DG Chest 2 View  Result Date: 07/05/2023 CLINICAL DATA:  Cough, fever, rales left lower lung. EXAM: CHEST - 2 VIEW COMPARISON:  AP chest 03/16/2019, chest two views 01/25/2008 FINDINGS: Status post median sternotomy. Cardiac silhouette is again mildly enlarged. Mediastinal contours are within normal limits. On frontal view there appears to be a left retrocardiac opacity, and there is mild heterogeneous opacification overlying the posterior lower lung on lateral view. No definite pleural effusion. No pneumothorax. Moderate multilevel degenerative disc changes of the thoracic spine. IMPRESSION: Left lower lung opacity, which may represent atelectasis or pneumonia. Electronically Signed   By: Neita Garnet M.D.   On: 07/05/2023 11:52    Microbiology: Results for orders placed or performed during the hospital encounter of 07/28/23  Aerobic/Anaerobic Culture w Gram Stain (surgical/deep wound)     Status: None (Preliminary result)   Collection Time: 07/28/23  2:45 PM   Specimen: BILE  Result Value Ref Range Status   Specimen Description   Final    BILE Performed at [employer]  [facility], [address] [place], [place] [address]    Special Requests   Final    NONE Performed at [facility], [address] [place], [place] [address]    Gram Stain NO WBC SEEN NO ORGANISMS SEEN   Final   Culture   Final    NO GROWTH < 12 HOURS Performed at [facility], [address] [place], [place] [address]    Report Status PENDING   Incomplete    Labs: CBC: Recent Labs  Lab 07/24/23 1724 07/28/23 0657 07/29/23 0517 07/30/23 0442  WBC 13.0* 8.4 5.1 6.1  NEUTROABS  --  6.3  --   --   HGB 15.0 14.2 13.5 13.4  HCT 47.5 43.9 40.7 40.4  MCV 96.0 93.2 91.5 90.8  PLT 155 177 158 173   Basic Metabolic Panel: Recent Labs  Lab 07/24/23 1724 07/28/23 0751 07/29/23 0517 07/30/23 0442  NA 138 136 136 136  K 3.9 3.5 3.5 3.6  CL 102 105 105 104  CO2 21* 23 22 23   GLUCOSE 184* 114* 110* 83  BUN 24* 18 15 15   CREATININE 0.73 0.68 0.60* 0.57*  CALCIUM 8.8* 8.3* 8.0* 8.2*   Liver Function Tests: Recent Labs  Lab 07/24/23 1724 07/28/23 0751 07/29/23 0517 07/30/23 0442  AST 110* 513* 363* 271*  ALT 141* 463* 378* 307*  ALKPHOS 99 168* 151* 174*  BILITOT 2.7* 1.2 1.7* 1.0  PROT 7.5 7.1 6.4* 6.4*  ALBUMIN 3.6 2.9* 2.6* 2.6*   CBG: Recent Labs  Lab 07/29/23 0805 07/29/23 1157 07/29/23 1623 07/29/23 2141 07/30/23 0947  GLUCAP 120* 110* 134* 100* 208*    Discharge time spent: greater than 30 minutes.  This record has been created using Conservation officer, historic buildings. Errors have been sought and corrected,but may not always be located. Such creation errors do not reflect on the standard of care.   Signed: Arnetha Courser, MD [facility] 07/30/2023

## 2023-07-30 NOTE — Progress Notes (Signed)
Patient discharged in stable condition. Patient education done for drain with wife as well. IV removed. AVS and saline script given to patient. No further questions at this time.

## 2023-07-30 NOTE — Discharge Instructions (Signed)
Please take your antibiotic as instructed until the course if fully completed Please flush your drain once daily with 5 ml of saline flush. Please change the gauze dressing around the drain at least every other day. Be careful with the drain tubing so it does not get caught on something and gets yanked.

## 2023-07-30 NOTE — TOC CM/SW Note (Signed)
Transition of Care [facility]) - Inpatient Brief Assessment   Patient Details  Name: Alexander [name] MRN: [id number] Date of Birth: [birth year]  Transition of Care [facility]) CM/SW Contact:    Liliana Cline, LCSW Phone Number: 07/30/2023, 9:34 AM  Transition of Care Asessment: Insurance and Status: Insurance coverage has been reviewed Patient has primary care physician: Yes     Prior/Current Home Services: No current home services Social Determinants of Health Reivew: SDOH reviewed no interventions necessary Readmission risk has been reviewed: Yes Transition of care needs: no transition of care needs at this time

## 2023-07-30 NOTE — Plan of Care (Signed)

## 2023-07-31 ENCOUNTER — Other Ambulatory Visit: Payer: Self-pay | Admitting: Interventional Radiology

## 2023-07-31 ENCOUNTER — Encounter: Payer: Self-pay | Admitting: Interventional Radiology

## 2023-07-31 DIAGNOSIS — K810 Acute cholecystitis: Secondary | ICD-10-CM

## 2023-08-02 LAB — AEROBIC/ANAEROBIC CULTURE W GRAM STAIN (SURGICAL/DEEP WOUND): Gram Stain: NONE SEEN

## 2023-08-07 ENCOUNTER — Emergency Department: Payer: Medicare Other

## 2023-08-07 ENCOUNTER — Emergency Department
Admission: EM | Admit: 2023-08-07 | Discharge: 2023-08-07 | Disposition: A | Discharge status: Home / Self Care | Payer: Medicare Other | Attending: Emergency Medicine | Admitting: Emergency Medicine

## 2023-08-07 ENCOUNTER — Other Ambulatory Visit: Payer: Self-pay

## 2023-08-07 DIAGNOSIS — R7989 Other specified abnormal findings of blood chemistry: Secondary | ICD-10-CM | POA: Insufficient documentation

## 2023-08-07 DIAGNOSIS — Z951 Presence of aortocoronary bypass graft: Secondary | ICD-10-CM | POA: Insufficient documentation

## 2023-08-07 DIAGNOSIS — E119 Type 2 diabetes mellitus without complications: Secondary | ICD-10-CM | POA: Insufficient documentation

## 2023-08-07 DIAGNOSIS — I2510 Atherosclerotic heart disease of native coronary artery without angina pectoris: Secondary | ICD-10-CM | POA: Diagnosis not present

## 2023-08-07 DIAGNOSIS — R112 Nausea with vomiting, unspecified: Secondary | ICD-10-CM | POA: Diagnosis present

## 2023-08-07 DIAGNOSIS — I509 Heart failure, unspecified: Secondary | ICD-10-CM | POA: Insufficient documentation

## 2023-08-07 DIAGNOSIS — I110 Hypertensive heart disease with heart failure: Secondary | ICD-10-CM | POA: Insufficient documentation

## 2023-08-07 LAB — CBC
HCT: 44.8 % (ref 39.0–52.0)
Hemoglobin: 14.8 g/dL (ref 13.0–17.0)
MCH: 30.1 pg (ref 26.0–34.0)
MCHC: 33 g/dL (ref 30.0–36.0)
MCV: 91.1 fL (ref 80.0–100.0)
Platelets: 296 10*3/uL (ref 150–400)
RBC: 4.92 MIL/uL (ref 4.22–5.81)
RDW: 14.5 % (ref 11.5–15.5)
WBC: 8.4 10*3/uL (ref 4.0–10.5)
nRBC: 0 % (ref 0.0–0.2)

## 2023-08-07 LAB — URINALYSIS, ROUTINE W REFLEX MICROSCOPIC
Bacteria, UA: NONE SEEN
Bilirubin Urine: NEGATIVE
Glucose, UA: >=500 mg/dL — AB
Hgb urine dipstick: NEGATIVE
Ketones, ur: 5 mg/dL — AB
Nitrite: NEGATIVE
Protein, ur: NEGATIVE mg/dL
Specific Gravity, Urine: 1.031 — ABNORMAL HIGH (ref 1.005–1.030)
pH: 7 (ref 5.0–8.0)

## 2023-08-07 LAB — COMPREHENSIVE METABOLIC PANEL
ALT: 126 U/L — ABNORMAL HIGH (ref 0–44)
AST: 63 U/L — ABNORMAL HIGH (ref 15–41)
Albumin: 3.5 g/dL (ref 3.5–5.0)
Alkaline Phosphatase: 133 U/L — ABNORMAL HIGH (ref 38–126)
Anion gap: 11 (ref 5–15)
BUN: 20 mg/dL (ref 8–23)
CO2: 25 mmol/L (ref 22–32)
Calcium: 8.9 mg/dL (ref 8.9–10.3)
Chloride: 102 mmol/L (ref 98–111)
Creatinine, Ser: 0.56 mg/dL — ABNORMAL LOW (ref 0.61–1.24)
GFR, Estimated: >60 mL/min (ref >60)
Glucose, Bld: 103 mg/dL — ABNORMAL HIGH (ref 70–99)
Potassium: 4.1 mmol/L (ref 3.5–5.1)
Sodium: 138 mmol/L (ref 135–145)
Total Bilirubin: 0.9 mg/dL (ref 0.3–1.2)
Total Protein: 7.8 g/dL (ref 6.5–8.1)

## 2023-08-07 LAB — LIPASE, BLOOD: Lipase: 22 U/L (ref 11–51)

## 2023-08-07 LAB — CBG MONITORING, ED: Glucose-Capillary: 78 mg/dL (ref 70–99)

## 2023-08-07 MED ORDER — ONDANSETRON 4 MG PO TBDP: 4.0000 mg | ORAL_TABLET | Freq: Three times a day (TID) | ORAL | 0 refills | Status: DC | PRN

## 2023-08-07 NOTE — Discharge Instructions (Signed)
Please take your nausea medication as needed, as prescribed.  Please follow-up with general surgery regarding today's ER visit and your recent nausea vomiting.  Return to the emergency department for any further nausea vomiting or abdominal pain, or any other symptom personally concerning to yourself.

## 2023-08-07 NOTE — ED Provider Notes (Signed)
[facility] Provider Note    Event Date/Time   First MD Initiated Contact with Patient 08/07/23 1320     (approximate)  History   Chief Complaint: Abdominal Pain  HPI  Alexander [name] is a 72 y.o. male with a past medical history of CHF, CAD, CABG, diabetes, gastric reflux, hypertension, recent biliary drain, presents to the emergency department for nausea and vomiting.  According to record review from the discharge summary 10/6, patient was admitted to the hospital 10/4 for right upper quadrant abdominal pain and ultimately had an MRCP concerning for acute cholecystitis with perforation.  Patient was treated with a percutaneous cholecystostomy drain by IR.  General surgery start the patient on Augmentin and we will plan on cholecystectomy in approximately 2 months.  Patient states this morning he was nauseated with several episodes of vomiting and some mild upper abdominal discomfort.  Patient states he has not had any nausea or vomiting since the drain was placed which concerned him so he came to the emergency department for evaluation.  He also states over the last 2 to 3 days he has noticed a slight decrease in the amount of output from the cholecystostomy drain.  Denies any fever.  Denies any pain nausea or vomiting in the emergency department.  Patient states the nausea vomiting's morning started shortly after taking his Augmentin and he relates his nausea to that.  Physical Exam   Triage Vital Signs: ED Triage Vitals [08/07/23 1151]  Encounter Vitals Group     BP 124/74     Systolic BP Percentile      Diastolic BP Percentile      Pulse Rate 80     Resp 16     Temp 98.3 F (36.8 C)     Temp Source Oral     SpO2 95 %     Weight      Height      Head Circumference      Peak Flow      Pain Score      Pain Loc      Pain Education      Exclude from Growth Chart     Most recent vital signs: Vitals:   08/07/23 1151  BP: 124/74  Pulse: 80  Resp: 16   Temp: 98.3 F (36.8 C)  SpO2: 95%    General: Awake, no distress.  CV:  Good peripheral perfusion.  Regular rate and rhythm  Resp:  Normal effort.  Equal breath sounds bilaterally.  Abd:  No distention.  Soft, nontender.  No rebound or guarding.   ED Results / Procedures / Treatments   RADIOLOGY  Right upper quadrant ultrasound pending   MEDICATIONS ORDERED IN ED: Medications - No data to display   IMPRESSION / MDM / ASSESSMENT AND PLAN / ED COURSE  I reviewed the triage vital signs and the nursing notes.  Patient's presentation is most consistent with acute presentation with potential threat to life or bodily function.  Patient presents to the emergency department for nausea vomiting upper abdominal discomfort this morning.  Overall the patient appears well, no distress.  Reassuring physical exam, benign abdominal exam, cholecystostomy tube appears to be in place with a well-appearing incision sutures intact no surrounding erythema or signs of infection.  Patient's labs have resulted showing a normal lipase.  Patient's LFTs are elevated however they are considerably downtrending from where they were 10 days ago, CBC is normal with a normal white blood cell count  urinalysis is normal.  We will obtain a right upper quadrant ultrasound to evaluate to ensure that the drain is still properly placed and that there is no fluid collection outside of the gallbladder.  Given the patient is asymptomatic with reassuring vitals reassuring physical exam a reassuring workup otherwise as long as ultrasound shows no significant finding anticipate likely discharge home with outpatient surgery follow-up.  FINAL CLINICAL IMPRESSION(S) / ED DIAGNOSES   Nausea vomiting  Rx / DC Orders   Zofran to be used as needed for nausea  Note:  This document was prepared using Dragon voice recognition software and may include unintentional dictation errors.   [name], MD 08/07/23 1437

## 2023-08-07 NOTE — ED Triage Notes (Addendum)
Pt comes via EMs from home with abdominal pain. Pt had gallbladder drain place couple weeks ago. Pt states since yesterday it hasn't been draining. Pt has been vomiting and states site and painful inside. Pt has been out of it per family.  Pt given 4 zofran by ems. Pt has IV in place.

## 2023-08-07 NOTE — ED Provider Notes (Signed)
-----------------------------------------   4:52 PM on 08/07/2023 -----------------------------------------   Blood pressure 111/64, pulse 86, temperature 97.9 F (36.6 C), temperature source Oral, resp. rate 18, SpO2 97%.  Received patient in signout.  72 year old male with recent biliary drain placement presenting today for nausea and vomiting.  Concern for potential dislodgment of his percutaneous cholecystotomy drain.  Laboratory workup was reassuring and patient did have symptomatic improvement here in the emergency department.  Signed out pending ultrasound read.  Right upper quadrant ultrasound read with no evidence of dislodgment of the tube and otherwise normal-appearing hepatobiliary system.  Patient safer discharge at this time will follow-up with general surgery.   Janith Lima, MD 08/07/23 [phone]

## 2023-08-16 ENCOUNTER — Encounter: Payer: Self-pay | Admitting: Surgery

## 2023-08-16 ENCOUNTER — Ambulatory Visit: Payer: Medicare Other | Admitting: Surgery

## 2023-08-16 ENCOUNTER — Telehealth: Payer: Self-pay

## 2023-08-16 VITALS — BP 114/73 | HR 87 | Temp 97.6°F | Ht 72.0 in | Wt 226.2 lb

## 2023-08-16 DIAGNOSIS — K810 Acute cholecystitis: Secondary | ICD-10-CM | POA: Diagnosis not present

## 2023-08-16 MED ORDER — SODIUM CHLORIDE 0.9% FLUSH: 5.0000 mL | Freq: Every day | INTRAVENOUS | 1 refills | Status: AC

## 2023-08-16 NOTE — Telephone Encounter (Signed)
Faxed medical clearance to Lenon Oms, NP at [phone].

## 2023-08-16 NOTE — Patient Instructions (Signed)
You have requested to have your gallbladder removed. This will be done at [facility] with Dr. Piscoya.  You will most likely be out of work 1-2 weeks for this surgery.  If you have FMLA or disability paperwork that needs filled out you may drop this off at our office or this can be faxed to [phone].  You will return after your post-op appointment with a lifting restriction for approximately 4 more weeks.  You will be able to eat anything you would like to following surgery. But, start by eating a bland diet and advance this as tolerated. The Gallbladder diet is below, please go as closely by this diet as possible prior to surgery to avoid any further attacks.  Please see the (blue)pre-care form that you have been given today. Our surgery scheduler will call you to verify surgery date and to go over information.   If you have any questions, please call our office.  Laparoscopic Cholecystectomy Laparoscopic cholecystectomy is surgery to remove the gallbladder. The gallbladder is located in the upper right part of the abdomen, behind the liver. It is a storage sac for bile, which is produced in the liver. Bile aids in the digestion and absorption of fats. Cholecystectomy is often done for inflammation of the gallbladder (cholecystitis). This condition is usually caused by a buildup of gallstones (cholelithiasis) in the gallbladder. Gallstones can block the flow of bile, and that can result in inflammation and pain. In severe cases, emergency surgery may be required. If emergency surgery is not required, you will have time to prepare for the procedure. Laparoscopic surgery is an alternative to open surgery. Laparoscopic surgery has a shorter recovery time. Your common bile duct may also need to be examined during the procedure. If stones are found in the common bile duct, they may be removed. LET YOUR HEALTH CARE PROVIDER KNOW ABOUT: Any allergies you have. All medicines you are taking,  including vitamins, herbs, eye drops, creams, and over-the-counter medicines. Previous problems you or members of your family have had with the use of anesthetics. Any blood disorders you have. Previous surgeries you have had.  Any medical conditions you have. RISKS AND COMPLICATIONS Generally, this is a safe procedure. However, problems may occur, including: Infection. Bleeding. Allergic reactions to medicines. Damage to other structures or organs. A stone remaining in the common bile duct. A bile leak from the cyst duct that is clipped when your gallbladder is removed. The need to convert to open surgery, which requires a larger incision in the abdomen. This may be necessary if your surgeon thinks that it is not safe to continue with a laparoscopic procedure. BEFORE THE PROCEDURE Ask your health care provider about: Changing or stopping your regular medicines. This is especially important if you are taking diabetes medicines or blood thinners. Taking medicines such as aspirin and ibuprofen. These medicines can thin your blood. Do not take these medicines before your procedure if your health care provider instructs you not to. Follow instructions from your health care provider about eating or drinking restrictions. Let your health care provider know if you develop a cold or an infection before surgery. Plan to have someone take you home after the procedure. Ask your health care provider how your surgical site will be marked or identified. You may be given antibiotic medicine to help prevent infection. PROCEDURE To reduce your risk of infection: Your health care team will wash or sanitize their hands. Your skin will be washed with soap. An IV   tube may be inserted into one of your veins. You will be given a medicine to make you fall asleep (general anesthetic). A breathing tube will be placed in your mouth. The surgeon will make several small cuts (incisions) in your abdomen. A thin,  lighted tube (laparoscope) that has a tiny camera on the end will be inserted through one of the small incisions. The camera on the laparoscope will send a picture to a TV screen (monitor) in the operating room. This will give the surgeon a good view inside your abdomen. A gas will be pumped into your abdomen. This will expand your abdomen to give the surgeon more room to perform the surgery. Other tools that are needed for the procedure will be inserted through the other incisions. The gallbladder will be removed through one of the incisions. After your gallbladder has been removed, the incisions will be closed with stitches (sutures), staples, or skin glue. Your incisions may be covered with a bandage (dressing). The procedure may vary among health care providers and hospitals. AFTER THE PROCEDURE Your blood pressure, heart rate, breathing rate, and blood oxygen level will be monitored often until the medicines you were given have worn off. You will be given medicines as needed to control your pain.   This information is not intended to replace advice given to you by your health care provider. Make sure you discuss any questions you have with your health care provider.   Document Released: 10/10/2005 Document Revised: 07/01/2015 Document Reviewed: 05/22/2013 Elsevier Interactive Patient Education 2016 [place] Diet for Gallbladder Conditions A low-fat diet can be helpful if you have pancreatitis or a gallbladder condition. With these conditions, your pancreas and gallbladder have trouble digesting fats. A healthy eating plan with less fat will help rest your pancreas and gallbladder and reduce your symptoms. WHAT DO I NEED TO KNOW ABOUT THIS DIET? Eat a low-fat diet. Reduce your fat intake to less than 20-30% of your total daily calories. This is less than 50-60 g of fat per day. Remember that you need some fat in your diet. Ask your dietician what your daily goal should  be. Choose nonfat and low-fat healthy foods. Look for the words "nonfat," "low fat," or "fat free." As a guide, look on the label and choose foods with less than 3 g of fat per serving. Eat only one serving. Avoid alcohol. Do not smoke. If you need help quitting, talk with your health care provider. Eat small frequent meals instead of three large heavy meals. WHAT FOODS CAN I EAT? Grains Include healthy grains and starches such as potatoes, wheat bread, fiber-rich cereal, and brown rice. Choose whole grain options whenever possible. In adults, whole grains should account for 45-65% of your daily calories.  Fruits and Vegetables Eat plenty of fruits and vegetables. Fresh fruits and vegetables add fiber to your diet. Meats and Other Protein Sources Eat lean meat such as chicken and pork. Trim any fat off of meat before cooking it. Eggs, fish, and beans are other sources of protein. In adults, these foods should account for 10-35% of your daily calories. Dairy Choose low-fat milk and dairy options. Dairy includes fat and protein, as well as calcium.  Fats and Oils Limit high-fat foods such as fried foods, sweets, baked goods, sugary drinks.  Other Creamy sauces and condiments, such as mayonnaise, can add extra fat. Think about whether or not you need to use them, or use smaller amounts or low fat options.  WHAT FOODS ARE NOT RECOMMENDED? High fat foods, such as: [employer]. Ice cream. [place] toast. Sweet rolls. Pizza. Cheese bread. Foods covered with batter, butter, creamy sauces, or cheese. Fried foods. Sugary drinks and desserts. Foods that cause gas or bloating   This information is not intended to replace advice given to you by your health care provider. Make sure you discuss any questions you have with your health care provider.   Document Released: 10/15/2013 Document Reviewed: 10/15/2013 Elsevier Interactive Patient Education [employer].

## 2023-08-16 NOTE — Progress Notes (Unsigned)
08/16/2023  History of Present Illness: Alexander [name] is a 72 y.o. male presenting for follow up of acute cholecystitis.  He was admitted on 10/4 with acute cholecystitis and MRCP with significant inflammatory changes in the RUQ and concern for possible focal rupture of the gallbladder wall.  He had a percutaneous cholecystostomy drain placed on 07/28/23.  He was eventually discharged home on 07/30/23.  He presented to the ED on 10/14 due to RUQ pain, but ultrasound showed that the drain was in the right position.  Per patient, apparently he has only been flushing his drain once a week, and has not been recording the output.  Overall he reports that the output has decreased recently.   Currently denies any nausea, vomiting, or abdominal pain.  He completed his antibiotic course.    Past Medical History: Past Medical History:  Diagnosis Date   Anginal pain (HCC)    CHF (congestive heart failure) (HCC)    Coronary artery disease    Diabetes mellitus without complication (HCC)    Type 2   GERD (gastroesophageal reflux disease)    History of kidney stones    Hypertension    Stroke (HCC)    mini, tingling right hand     Past Surgical History: Past Surgical History:  Procedure Laterality Date   CARDIAC CATHETERIZATION Left 08/09/2016   Procedure: Left Heart Cath and Coronary Angiography;  Surgeon: Dalia Heading, MD;  Location: [facility] INVASIVE CV LAB;  Service: Cardiovascular;  Laterality: Left;   CATARACT EXTRACTION W/PHACO Right 02/06/2023   Procedure: CATARACT EXTRACTION PHACO AND INTRAOCULAR LENS PLACEMENT (IOC) AHMED TUBE SHUNT W/ TUTOPLAST RIGHT DIABETIC  3.94   00:42.6;  Surgeon: Nevada Crane, MD;  Location: [facility];  Service: Ophthalmology;  Laterality: Right;   COLONOSCOPY WITH PROPOFOL N/A 02/02/2021   Procedure: COLONOSCOPY WITH PROPOFOL;  Surgeon: Regis Bill, MD;  Location: [facility] ENDOSCOPY;  Service: Endoscopy;  Laterality: N/A;   CORONARY ARTERY BYPASS GRAFT      2017 x5   IR PERC CHOLECYSTOSTOMY  07/28/2023    Home Medications: Prior to Admission medications   Medication Sig Start Date End Date Taking? Authorizing Provider  acetaminophen (TYLENOL) 500 MG tablet Take 1,000 mg by mouth every 6 (six) hours as needed for mild pain or headache.   Yes [provider]  aspirin EC 81 MG tablet Take 81 mg by mouth daily.   Yes [provider]  atorvastatin (LIPITOR) 80 MG tablet Take 1 tablet (80 mg total) by mouth daily. Hold until you see your doctor and have your liver enzymes repeated 07/30/23  Yes Arnetha Courser, MD  carvedilol (COREG) 3.125 MG tablet Take 3.125 mg by mouth daily with supper.   Yes [provider]  empagliflozin (JARDIANCE) 25 MG TABS tablet Take 25 mg by mouth daily. 12/27/18  Yes [provider]  feeding supplement (ENSURE ENLIVE / ENSURE PLUS) LIQD Take 237 mLs by mouth 2 (two) times daily between meals. 07/30/23  Yes Arnetha Courser, MD  LANTUS SOLOSTAR 100 UNIT/ML Solostar Pen Inject 45 Units into the skin at bedtime. 07/14/23  Yes [provider]  OZEMPIC, 2 MG/DOSE, 8 MG/3ML SOPN Inject 2 mg into the skin once a week. Tuesday   Yes [provider]  sacubitril-valsartan (ENTRESTO) 24-26 MG Take 1 tablet by mouth every 12 (twelve) hours. 07/25/23  Yes [provider]  sodium chloride flush (NS) 0.9 % SOLN 5 mLs by Intracatheter route daily. 08/16/23 10/15/23  Faryal Marxen, Elita Quick,  MD    Allergies: No Known Allergies  Review of Systems: Review of Systems  Constitutional:  Negative for chills and fever.  HENT:  Negative for hearing loss.   Respiratory:  Negative for shortness of breath.   Cardiovascular:  Negative for chest pain.  Gastrointestinal:  Negative for abdominal pain, nausea and vomiting.  Genitourinary:  Negative for dysuria.  Musculoskeletal:  Negative for myalgias.  Skin:  Negative for rash.  Neurological:  Negative for dizziness.  Psychiatric/Behavioral:   Negative for depression.     Physical Exam BP 114/73 (BP Location: Right Arm, Patient Position: Sitting, Cuff Size: Large)   Pulse 87   Temp 97.6 F (36.4 C) (Oral)   Ht 6' (1.829 m)   Wt 226 lb 3.2 oz (102.6 kg)   SpO2 97%   BMI 30.68 kg/m  CONSTITUTIONAL: No acute distress, well nourished. HEENT:  Normocephalic, atraumatic, extraocular motion intact. NECK:  Trachea is midline, no jugular venous distention RESPIRATORY:  Lungs are clear, and breath sounds are equal bilaterally. Normal respiratory effort without pathologic use of accessory muscles. CARDIOVASCULAR: Heart is regular without murmurs, gallops, or rubs. GI: The abdomen is soft, non-distended, non-tender.  Percutaneous drain in place but it was clamped.  I unclamped the drain and also showed the patient and his wife how to flush it.  There was some light bilious fluid in the tubing. MUSCULOSKELETAL:  Normal gait, no peripheral edema. NEUROLOGIC:  Motor and sensation is grossly normal.  Cranial nerves are grossly intact. PSYCH:  Alert and oriented to person, place and time. Affect is normal.  Labs/Imaging: Ultrasound RUQ on 07/28/23: IMPRESSION: 1. Distended gallbladder with mild diffuse wall thickening and small pericholecystic free fluid. Small amount of sludge and calculi noted. Sonographic Murphy's sign was negative as per the technologist. Findings are equivocal for acute cholecystitis. If clinically warranted, nuclear medicine hepatobiliary scan may be helpful for further evaluation. 2. Small amount of ascites.  MRCP on 07/28/23: IMPRESSION: 1. Cholelithiasis. Mild diffuse gallbladder wall thickening and large amount of pericholecystic fluid. Suggestion of a small focal discontinuity in the anterior gallbladder wall body wall. Findings are concerning for perforated acute cholecystitis. 2. No biliary ductal dilatation. No choledocholithiasis. 3. 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 No pancreatic duct dilation. Follow-up MRI abdomen without and with IV contrast recommended in 2 years. This recommendation follows ACR consensus guidelines: Management of Incidental Pancreatic Cysts: A White Paper of the ACR Incidental Findings Committee. J Am Coll Radiol [phone]  Assessment and Plan: This is a 72 y.o. male with acute cholecystitis s/p percutaneous cholecystostomy drain placement.  --Discussed with the patient the rationale for deferring cholecystectomy during his admission due to the significant inflammatory changes noted in the RUQ and how that would put him at a much increased risk of complications intraoperatively, particularly of injury.  Discussed that the drain he currently has is likely temporary only and my plan would be for eventual cholecystectomy.  There was misunderstanding on how to work the drain.  I instructed them how to unclamp it, and how to flush it.   --Discussed with the patient the plan for an interval robotic assisted cholecystectomy and reviewed the surgery at length with him including the planned incisions, risks of bleeding, infection, injury to surrounding structures, that this would be an outpatient procedure, the use of ICG to better evaluate the biliary anatomy, postoperative activity restrictions, pain control, and he is willing to  proceed. - We will send for medical clearance. - We will tentatively schedule the patient for surgery on 09/28/2023.  He is on aspirin and will have a last dose of aspirin on 09/22/2023.  All of his questions have been answered.  I spent 40 minutes dedicated to the care of this patient on the date of this encounter to include pre-visit review of records, face-to-face time with the patient discussing diagnosis and management, and any post-visit coordination of care.   Howie Ill, MD [facility]

## 2023-08-17 ENCOUNTER — Telehealth: Payer: Self-pay | Admitting: Surgery

## 2023-08-17 NOTE — Telephone Encounter (Signed)
Unable to leave message, voice mailbox full.  If patient calls back, please inform him of the following regarding scheduled surgery with Dr. Aleen Campi.   Pre-Admission date/time, and Surgery date at [facility].  Surgery Date: 09/28/23 Preadmission Testing Date: 09/20/23 (phone 8a-1p)  Also patient will need to call at [phone], between 1-3:00pm the day before surgery, to find out what time to arrive for surgery.

## 2023-08-22 NOTE — Telephone Encounter (Signed)
Patient calls back, he is now informed of all dates regarding surgery.

## 2023-08-22 NOTE — Telephone Encounter (Signed)
Left message with wife for patient to call us.   His mailbox is still full.

## 2023-08-23 ENCOUNTER — Other Ambulatory Visit: Payer: Medicare Other

## 2023-08-31 ENCOUNTER — Telehealth: Payer: Self-pay | Admitting: Surgery

## 2023-08-31 NOTE — Telephone Encounter (Signed)
Patient is called and informed to call Interventional Radiology at [phone]

## 2023-08-31 NOTE — Telephone Encounter (Signed)
Patient's wife, Larita Fife calls.  Please call her.  Patient with acute cholecystitis and has a drain. She states that she can't flush with saline as the drain is broken. She thinks there is a part that is missing. Please call her. Thank you.

## 2023-08-31 NOTE — Progress Notes (Signed)
Patient for IR Chole Tube Exchange on Friday 09/01/2023, Zollie Scale called and spoke with the patient on the phone and gave pre-procedure instructions. Pt was made aware to be here at 2:30p and check in at the new entrance. Pt stated understanding.  Called 08/31/2023

## 2023-09-01 ENCOUNTER — Ambulatory Visit
Admission: RE | Admit: 2023-09-01 | Discharge: 2023-09-01 | Disposition: A | Discharge status: Home / Self Care | Payer: Medicare Other | Source: Ambulatory Visit | Attending: Interventional Radiology | Admitting: Radiology

## 2023-09-01 DIAGNOSIS — Z434 Encounter for attention to other artificial openings of digestive tract: Secondary | ICD-10-CM | POA: Insufficient documentation

## 2023-09-01 DIAGNOSIS — K810 Acute cholecystitis: Secondary | ICD-10-CM | POA: Diagnosis not present

## 2023-09-01 HISTORY — PX: IR EXCHANGE BILIARY DRAIN: IMG6046

## 2023-09-01 MED ORDER — IOHEXOL 300 MG/ML  SOLN
50.0000 mL | Freq: Once | INTRAMUSCULAR | Status: AC | PRN
  Administered 2023-09-01: 10 mL

## 2023-09-01 MED ORDER — LIDOCAINE HCL 1 % IJ SOLN
INTRAMUSCULAR | Status: AC
  Filled 2023-09-01: qty 20

## 2023-09-01 MED ORDER — LIDOCAINE HCL 1 % IJ SOLN
10.0000 mL | Freq: Once | INTRAMUSCULAR | Status: AC
  Administered 2023-09-01: 4 mL via INTRADERMAL

## 2023-09-08 ENCOUNTER — Other Ambulatory Visit: Payer: Medicare Other | Admitting: Radiology

## 2023-09-18 ENCOUNTER — Encounter: Payer: Self-pay | Admitting: Surgery

## 2023-09-18 ENCOUNTER — Ambulatory Visit: Payer: Medicare Other | Admitting: Surgery

## 2023-09-18 VITALS — BP 109/65 | HR 87 | Temp 98.0°F | Ht 72.0 in | Wt 224.0 lb

## 2023-09-18 DIAGNOSIS — K810 Acute cholecystitis: Secondary | ICD-10-CM | POA: Diagnosis not present

## 2023-09-18 NOTE — Patient Instructions (Signed)
You have requested to have your gallbladder removed. This will be done at [facility] with Dr. Everlene Farrier on December 5th.  You will most likely be out of work 1-2 weeks for this surgery.  If you have FMLA or disability paperwork that needs filled out you may drop this off at our office or this can be faxed to [phone].  You will return after your post-op appointment with a lifting restriction for approximately 4 more weeks.  You will be able to eat anything you would like to following surgery. But, start by eating a bland diet and advance this as tolerated. The Gallbladder diet is below, please go as closely by this diet as possible prior to surgery to avoid any further attacks.  If you have any questions, please call our office.  Laparoscopic Cholecystectomy Laparoscopic cholecystectomy is surgery to remove the gallbladder. The gallbladder is located in the upper right part of the abdomen, behind the liver. It is a storage sac for bile, which is produced in the liver. Bile aids in the digestion and absorption of fats. Cholecystectomy is often done for inflammation of the gallbladder (cholecystitis). This condition is usually caused by a buildup of gallstones (cholelithiasis) in the gallbladder. Gallstones can block the flow of bile, and that can result in inflammation and pain. In severe cases, emergency surgery may be required. If emergency surgery is not required, you will have time to prepare for the procedure. Laparoscopic surgery is an alternative to open surgery. Laparoscopic surgery has a shorter recovery time. Your common bile duct may also need to be examined during the procedure. If stones are found in the common bile duct, they may be removed. LET [facility] CARE PROVIDER KNOW ABOUT: Any allergies you have. All medicines you are taking, including vitamins, herbs, eye drops, creams, and over-the-counter medicines. Previous problems you or members of your family have had with the  use of anesthetics. Any blood disorders you have. Previous surgeries you have had.  Any medical conditions you have. RISKS AND COMPLICATIONS Generally, this is a safe procedure. However, problems may occur, including: Infection. Bleeding. Allergic reactions to medicines. Damage to other structures or organs. A stone remaining in the common bile duct. A bile leak from the cyst duct that is clipped when your gallbladder is removed. The need to convert to open surgery, which requires a larger incision in the abdomen. This may be necessary if your surgeon thinks that it is not safe to continue with a laparoscopic procedure. BEFORE THE PROCEDURE Ask your health care provider about: Changing or stopping your regular medicines. This is especially important if you are taking diabetes medicines or blood thinners. Taking medicines such as aspirin and ibuprofen. These medicines can thin your blood. Do not take these medicines before your procedure if your health care provider instructs you not to. Follow instructions from your health care provider about eating or drinking restrictions. Let your health care provider know if you develop a cold or an infection before surgery. Plan to have someone take you home after the procedure. Ask your health care provider how your surgical site will be marked or identified. You may be given antibiotic medicine to help prevent infection. PROCEDURE To reduce your risk of infection: Your health care team will wash or sanitize their hands. Your skin will be washed with soap. An IV tube may be inserted into one of your veins. You will be given a medicine to make you fall asleep (general anesthetic). A breathing tube  will be placed in your mouth. The surgeon will make several small cuts (incisions) in your abdomen. A thin, lighted tube (laparoscope) that has a tiny camera on the end will be inserted through one of the small incisions. The camera on the laparoscope  will send a picture to a TV screen (monitor) in the operating room. This will give the surgeon a good view inside your abdomen. A gas will be pumped into your abdomen. This will expand your abdomen to give the surgeon more room to perform the surgery. Other tools that are needed for the procedure will be inserted through the other incisions. The gallbladder will be removed through one of the incisions. After your gallbladder has been removed, the incisions will be closed with stitches (sutures), staples, or skin glue. Your incisions may be covered with a bandage (dressing). The procedure may vary among health care providers and hospitals. AFTER THE PROCEDURE Your blood pressure, heart rate, breathing rate, and blood oxygen level will be monitored often until the medicines you were given have worn off. You will be given medicines as needed to control your pain.   This information is not intended to replace advice given to you by your health care provider. Make sure you discuss any questions you have with your health care provider.   Document Released: 10/10/2005 Document Revised: 07/01/2015 Document Reviewed: 05/22/2013 Elsevier Interactive Patient Education 2016 Else[employer] Inc.   Low-Fat Diet for Gallbladder Conditions A low-fat diet can be helpful if you have pancreatitis or a gallbladder condition. With these conditions, your pancreas and gallbladder have trouble digesting fats. A healthy eating plan with less fat will help rest your pancreas and gallbladder and reduce your symptoms. WHAT DO I NEED TO KNOW ABOUT THIS DIET? Eat a low-fat diet. Reduce your fat intake to less than 20-30% of your total daily calories. This is less than 50-60 g of fat per day. Remember that you need some fat in your diet. Ask your dietician what your daily goal should be. Choose nonfat and low-fat healthy foods. Look for the words "nonfat," "low fat," or "fat free." As a guide, look on the label and choose foods  with less than 3 g of fat per serving. Eat only one serving. Avoid alcohol. Do not smoke. If you need help quitting, talk with your health care provider. Eat small frequent meals instead of three large heavy meals. WHAT FOODS CAN I EAT? Grains Include healthy grains and starches such as potatoes, wheat bread, fiber-rich cereal, and brown rice. Choose whole grain options whenever possible. In adults, whole grains should account for 45-65% of your daily calories.  Fruits and Vegetables Eat plenty of fruits and vegetables. Fresh fruits and vegetables add fiber to your diet. Meats and Other Protein Sources Eat lean meat such as chicken and pork. Trim any fat off of meat before cooking it. Eggs, fish, and beans are other sources of protein. In adults, these foods should account for 10-35% of your daily calories. Dairy Choose low-fat milk and dairy options. Dairy includes fat and protein, as well as calcium.  Fats and Oils Limit high-fat foods such as fried foods, sweets, baked goods, sugary drinks.  Other Creamy sauces and condiments, such as mayonnaise, can add extra fat. Think about whether or not you need to use them, or use smaller amounts or low fat options. WHAT FOODS ARE NOT RECOMMENDED? High fat foods, such as: [employer]. Ice cream. [place] toast. Sweet rolls. Pizza. Cheese bread. Foods covered with batter,  butter, creamy sauces, or cheese. Fried foods. Sugary drinks and desserts. Foods that cause gas or bloating   This information is not intended to replace advice given to you by your health care provider. Make sure you discuss any questions you have with your health care provider.   Document Released: 10/15/2013 Document Reviewed: 10/15/2013 Elsevier Interactive Patient Education [employer]

## 2023-09-18 NOTE — Progress Notes (Signed)
09/18/2023  History of Present Illness: Alexander [name] is a 72 y.o. male with history of acute cholecystitis with possible gallbladder perforation based on MRCP, status post percutaneous cholecystostomy drain placement on 07/28/23.  Patient currently is scheduled for robotic cholecystectomy on 09/28/2023.  Patient presents today for H&P update.  Patient reports that he has had some falls over the last few weeks and is currently using a walker.  He feels that his legs been weaker.  Denies any abdominal pain, nausea, vomiting.  He does feel some soreness at the drain insertion site but otherwise no other issues.  He has been eating well.  Drain has been emptying low volume in a daily basis.  He had a drain exchange with interventional radiology on 09/01/2023 and at that point cholangiogram showed a patent cystic duct and common bile duct.  Past Medical History: Past Medical History:  Diagnosis Date   Anginal pain (HCC)    CHF (congestive heart failure) (HCC)    Coronary artery disease    Diabetes mellitus without complication (HCC)    Type 2   GERD (gastroesophageal reflux disease)    History of kidney stones    Hypertension    Stroke (HCC)    mini, tingling right hand     Past Surgical History: Past Surgical History:  Procedure Laterality Date   CARDIAC CATHETERIZATION Left 08/09/2016   Procedure: Left Heart Cath and Coronary Angiography;  Surgeon: Dalia Heading, MD;  Location: [facility] INVASIVE CV LAB;  Service: Cardiovascular;  Laterality: Left;   CATARACT EXTRACTION W/PHACO Right 02/06/2023   Procedure: CATARACT EXTRACTION PHACO AND INTRAOCULAR LENS PLACEMENT (IOC) AHMED TUBE SHUNT W/ TUTOPLAST RIGHT DIABETIC  3.94   00:42.6;  Surgeon: Nevada Crane, MD;  [facility];  Service: Ophthalmology;  Laterality: Right;   COLONOSCOPY WITH PROPOFOL N/A 02/02/2021   Procedure: COLONOSCOPY WITH PROPOFOL;  Surgeon: Regis Bill, MD;  Location: [facility] ENDOSCOPY;  Service:  Endoscopy;  Laterality: N/A;   CORONARY ARTERY BYPASS GRAFT     2017 x5   IR EXCHANGE BILIARY DRAIN  09/01/2023   IR PERC CHOLECYSTOSTOMY  07/28/2023    Home Medications: Prior to Admission medications   Medication Sig Start Date End Date Taking? Authorizing Provider  acetaminophen (TYLENOL) 500 MG tablet Take 1,000 mg by mouth every 6 (six) hours as needed for mild pain or headache.   Yes [provider]  aspirin EC 81 MG tablet Take 81 mg by mouth daily.   Yes [provider]  atorvastatin (LIPITOR) 80 MG tablet Take 1 tablet (80 mg total) by mouth daily. Hold until you see your doctor and have your liver enzymes repeated 07/30/23  Yes Arnetha Courser, MD  carvedilol (COREG) 3.125 MG tablet Take 3.125 mg by mouth 2 (two) times daily with a meal.   Yes [provider]  empagliflozin (JARDIANCE) 25 MG TABS tablet Take 25 mg by mouth daily. 12/27/18  Yes [provider]  feeding supplement (ENSURE ENLIVE / ENSURE PLUS) LIQD Take 237 mLs by mouth 2 (two) times daily between meals. 07/30/23  Yes Arnetha Courser, MD  LANTUS SOLOSTAR 100 UNIT/ML Solostar Pen Inject 38 Units into the skin at bedtime. 07/14/23  Yes [provider]  OZEMPIC, 2 MG/DOSE, 8 MG/3ML SOPN Inject 2 mg into the skin every 7 (seven) days. Tuesday   Yes [provider]  sodium chloride flush (NS) 0.9 % SOLN 5 mLs by Intracatheter route daily. 08/16/23 10/15/23 Yes Henrene Dodge, MD  sacubitril-valsartan (ENTRESTO) 24-26 MG Take 1 tablet by mouth every 12 (twelve) hours. Patient not taking: Reported on 09/15/2023 07/25/23   [provider]    Allergies: No Known Allergies  Review of Systems: Review of Systems  Constitutional:  Negative for chills and fever.  Respiratory:  Negative for shortness of breath.   Cardiovascular:  Negative for chest pain.  Gastrointestinal:  Negative for abdominal pain, nausea and vomiting.  Genitourinary:  Negative for dysuria.   Neurological:        Falls at home    Physical Exam BP 109/65   Pulse 87   Temp 98 F (36.7 C)   Ht 6' (1.829 m)   Wt 224 lb (101.6 kg)   SpO2 100%   BMI 30.38 kg/m  CONSTITUTIONAL: No acute distress HEENT:  Normocephalic, atraumatic, extraocular motion intact. RESPIRATORY:  Lungs are clear, and breath sounds are equal bilaterally. Normal respiratory effort without pathologic use of accessory muscles. CARDIOVASCULAR: Heart is regular without murmurs, gallops, or rubs. GI: The abdomen is soft, non-distended, non-tender to palpation.  RUQ drain in place with thick bilious fluid with some purulent consistency. NEUROLOGIC:  Motor and sensation is grossly normal.  Cranial nerves are grossly intact. PSYCH:  Alert and oriented to person, place and time. Affect is normal.  Labs/Imaging: IR Drain exchange on 09/01/23: IMPRESSION: 1. Successful exchange of percutaneous cholecystostomy tube for a new, similar 10 French cholecystostomy tube. Cholecystostomy tube placed to bag drainage. 2. Cholangiogram demonstrated patent cystic duct and common bile duct without filling defect.  Assessment and Plan: This is a 72 y.o. male with history of acute cholecystitis s/p percutaneous cholecystostomy drain placement.  --The patient had some recent falls and is using a walker for safety and balance.  Discussed with him that it would be prudent to ask his PCP for PT consultation to help with exercises to build lower extremity strength.   --From the abdominal standpoint, the patient is doing very well and denies any abdominal pain, nausea, or vomiting.  The drain is working well, although having purulent like bilious material, but clinically without any symptoms.   --Patient is scheduled for robotic cholecystectomy on 09/28/23.  Discussed again the surgical plan, planned incisions, possible drain, risks, and he's willing to proceed. --Patient has been cleared medically.  Last dose of Aspirin on  09/22/23.  I spent 20 minutes dedicated to the care of this patient on the date of this encounter to include pre-visit review of records, face-to-face time with the patient discussing diagnosis and management, and any post-visit coordination of care.   Howie Ill, MD [facility]

## 2023-09-20 ENCOUNTER — Encounter: Payer: Self-pay | Admitting: Urgent Care

## 2023-09-20 ENCOUNTER — Encounter
Admission: RE | Admit: 2023-09-20 | Discharge: 2023-09-20 | Disposition: A | Discharge status: Home / Self Care | Payer: Medicare Other | Source: Ambulatory Visit | Attending: Surgery | Admitting: Surgery

## 2023-09-20 DIAGNOSIS — Z01818 Encounter for other preprocedural examination: Secondary | ICD-10-CM

## 2023-09-20 HISTORY — DX: Personal history of traumatic brain injury: Z87.820

## 2023-09-20 HISTORY — DX: Acidosis, unspecified: E87.20

## 2023-09-20 HISTORY — DX: Acute cholecystitis: K81.0

## 2023-09-20 HISTORY — DX: Type 2 diabetes mellitus with severe nonproliferative diabetic retinopathy without macular edema, left eye: E11.3492

## 2023-09-20 HISTORY — DX: Cellulitis of face: L03.211

## 2023-09-20 HISTORY — DX: Type 2 diabetes mellitus without complications: E11.9

## 2023-09-20 HISTORY — DX: Ischemic cardiomyopathy: I25.5

## 2023-09-20 HISTORY — DX: Other chronic pain: G89.29

## 2023-09-20 HISTORY — DX: Type 2 diabetes mellitus with moderate nonproliferative diabetic retinopathy without macular edema, right eye: E11.3391

## 2023-09-20 HISTORY — DX: Other specified abnormal findings of blood chemistry: R79.89

## 2023-09-20 HISTORY — DX: Low back pain, unspecified: M54.50

## 2023-09-20 HISTORY — DX: Polyneuropathy, unspecified: G62.9

## 2023-09-20 HISTORY — DX: Repeated falls: R29.6

## 2023-09-20 NOTE — Progress Notes (Signed)
  Perioperative Services Pre-Admission/Anesthesia Testing    Date: 09/20/23  Name: Alexander [name] MRN:   [id number]  Re: GLP-1 clearance and provider recommendations   Planned Surgical Procedure(s):    Case: [id number] Date/Time: 09/28/23 0950   Procedures:      XI ROBOTIC ASSISTED LAPAROSCOPIC CHOLECYSTECTOMY     INDOCYANINE GREEN FLUORESCENCE IMAGING (ICG)   Anesthesia type: General   Pre-op diagnosis: acute cholecystitis   Location: [facility] OR ROOM 04 / [facility]   Surgeons: Henrene Dodge, MD      Clinical Notes:  Patient is scheduled for the above procedure with the indicated provider/surgeon. In review of his medication reconciliation it was noted that patient is on a prescribed GLP-1 medication. Per guidelines issued by the American Society of Anesthesiologists (ASA), it is recommended that these medications be held for 7 days prior to the patient undergoing any type of elective surgical procedure. The patient is taking the following GLP-1 medication:  [x]  SEMAGLUTIDE   []  EXENATIDE  []  LIRAGLUTIDE   []  LIXISENATIDE  []  DULAGLUTIDE     []  TIRZEPATIDE (GLP-1/GIP)  Reached out to prescribing provider Maryjane Hurter, MD) to make them aware of the guidelines from anesthesia. Given that this patient takes the prescribed GLP-1 medication for his  diabetes diagnosis, rather than for weight loss, recommendations from the prescribing provider were solicited. Prescribing provider made aware of the following so that informed decision/POC can be developed for this patient that may be taking medications belonging to these drug classes:  Oral GLP-1 medications will be held 1 day prior to surgery.  Injectable GLP-1 medications will be held 7 days prior to surgery.  Metformin is routinely held 48 hours prior to surgery due to renal concerns, potential need for contrasted imaging perioperatively, and the potential for tissue hypoxia leading to drug induced lactic  acidosis.  All SGLT2i medications are held 72 hours prior to surgery as they can be associated with the increased potential for developing euglycemic diabetic ketoacidosis (EDKA).   Impression and Plan:  Alexander [name] is on a prescribed GLP-1 medication, which induces the known side effect of decreased gastric emptying. Efforts are bring made to mitigate the risk of perioperative hyperglycemic events, as elevated blood glucose levels have been found to contribute to intra/postoperative complications. Additionally, hyperglycemic extremes can potentially necessitate the postponing of a patient's elective case in order to better optimize perioperative glycemic control, again with the aforementioned guidelines in place. With this in mind, recommendations have been sought from the prescribing provider, who has cleared patient to proceed with holding the prescribed GLP-1 as per the guidelines from the ASA.   Provider recommending: no further recommendations received from the prescribing provider.  Copy of signed clearance and recommendations placed on patient's chart for inclusion in their medical record and for review by the surgical/anesthetic team on the day of his procedure.   Quentin Mulling, MSN, APRN, FNP-C, CEN [facility]  Perioperative Services Nurse Practitioner Phone: [phone] Fax: [phone] 09/20/23 3:15 PM  NOTE: This note has been prepared using Dragon dictation software. Despite my best ability to proofread, there is always the potential that unintentional transcriptional errors may still occur from this process.

## 2023-09-20 NOTE — Patient Instructions (Addendum)
Your procedure is scheduled on:09-28-23 Thursday Report to the Registration Desk on the 1st floor of the Medical [facility].Then proceed to the 2nd floor Surgery Desk To find out your arrival time, please call [phone] between 1PM - 3PM on:09-27-23 Wednesday If your arrival time is 6:00 am, do not arrive before that time as the Medical Mall entrance doors do not open until 6:00 am.  REMEMBER: Instructions that are not followed completely may result in serious medical risk, up to and including death; or upon the discretion of your surgeon and anesthesiologist your surgery may need to be rescheduled.  Do not eat food after midnight the night before surgery.  No gum chewing or hard candies.  You may however, drink Water up to 2 hours before you are scheduled to arrive for your surgery. Do not drink anything within 2 hours of your scheduled arrival time.  One week prior to surgery:Stop NOW (09-20-23) Stop Anti-inflammatories (NSAIDS) such as Advil, Aleve, Ibuprofen, Motrin, Naproxen, Naprosyn and Aspirin based products such as Excedrin, Goody's Powder, BC Powder. Stop ANY OVER THE COUNTER supplements until after surgery.  You may however, continue to take Tylenol if needed for pain up until the day of surgery.  Stop Ozempic 7 days prior to surgery-Do NOT take again until AFTER surgery  Stop 81 mg Aspirin 5 days prior to surgery-Last dose will be on 09-22-23 Friday  Stop empagliflozin (JARDIANCE) 3 day prior to surgery-Last dose will be on 09-24-23 Sunday  Continue taking all of your other prescription medications up until the day of surgery.  ON THE DAY OF SURGERY ONLY TAKE THESE MEDICATIONS WITH SIPS OF WATER: -carvedilol (COREG)   Take half of your Lantus Insulin (19 units) the night prior to surgery and NO Insulin the morning of surgery  No Alcohol for 24 hours before or after surgery.  No Smoking including e-cigarettes for 24 hours before surgery.  No chewable tobacco products for at  least 6 hours before surgery.  No nicotine patches on the day of surgery.  Do not use any "recreational" drugs for at least a week (preferably 2 weeks) before your surgery.  Please be advised that the combination of cocaine and anesthesia may have negative outcomes, up to and including death. If you test positive for cocaine, your surgery will be cancelled.  On the morning of surgery brush your teeth with toothpaste and water, you may rinse your mouth with mouthwash if you wish. Do not swallow any toothpaste or mouthwash.  Use CHG Soap as directed on instruction sheet.  Do not wear jewelry, make-up, hairpins, clips or nail polish.  For welded (permanent) jewelry: bracelets, anklets, waist bands, etc.  Please have this removed prior to surgery.  If it is not removed, there is a chance that hospital personnel will need to cut it off on the day of surgery.  Do not wear lotions, powders, or perfumes.   Do not shave body hair from the neck down 48 hours before surgery.  Contact lenses, hearing aids and dentures may not be worn into surgery.  Do not bring valuables to the hospital. [facility] is not responsible for any missing/lost belongings or valuables.   Notify your doctor if there is any change in your medical condition (cold, fever, infection).  Wear comfortable clothing (specific to your surgery type) to the hospital.  After surgery, you can help prevent lung complications by doing breathing exercises.  Take deep breaths and cough every 1-2 hours. Your doctor may order a  device called an Incentive Spirometer to help you take deep breaths. When coughing or sneezing, hold a pillow firmly against your incision with both hands. This is called "splinting." Doing this helps protect your incision. It also decreases belly discomfort.  If you are being admitted to the hospital overnight, leave your suitcase in the car. After surgery it may be brought to your room.  In case of increased  patient census, it may be necessary for you, the patient, to continue your postoperative care in the Same Day Surgery department.  If you are being discharged the day of surgery, you will not be allowed to drive home. You will need a responsible individual to drive you home and stay with you for 24 hours after surgery.   If you are taking public transportation, you will need to have a responsible individual with you.  Please call the Pre-admissions Testing Dept. at [phone] if you have any questions about these instructions.  Surgery Visitation Policy:  Patients having surgery or a procedure may have two visitors.  Children under the age of 46 must have an adult with them who is not the patient.     Preparing for Surgery with CHLORHEXIDINE GLUCONATE (CHG) Soap  Chlorhexidine Gluconate (CHG) Soap  o An antiseptic cleaner that kills germs and bonds with the skin to continue killing germs even after washing  o Used for showering the night before surgery and morning of surgery  Before surgery, you can play an important role by reducing the number of germs on your skin.  CHG (Chlorhexidine gluconate) soap is an antiseptic cleanser which kills germs and bonds with the skin to continue killing germs even after washing.  Please do not use if you have an allergy to CHG or antibacterial soaps. If your skin becomes reddened/irritated stop using the CHG.  1. Shower the NIGHT BEFORE SURGERY and the MORNING OF SURGERY with CHG soap.  2. If you choose to wash your hair, wash your hair first as usual with your normal shampoo.  3. After shampooing, rinse your hair and body thoroughly to remove the shampoo.  4. Use CHG as you would any other liquid soap. You can apply CHG directly to the skin and wash gently with a scrungie or a clean washcloth.  5. Apply the CHG soap to your body only from the neck down. Do not use on open wounds or open sores. Avoid contact with your eyes, ears, mouth,  and genitals (private parts). Wash face and genitals (private parts) with your normal soap.  6. Wash thoroughly, paying special attention to the area where your surgery will be performed.  7. Thoroughly rinse your body with warm water.  8. Do not shower/wash with your normal soap after using and rinsing off the CHG soap.  9. Pat yourself dry with a clean towel.  10. Wear clean pajamas to bed the night before surgery.  12. Place clean sheets on your bed the night of your first shower and do not sleep with pets.  13. Shower again with the CHG soap on the day of surgery prior to arriving at the hospital.  14. Do not apply any deodorants/lotions/powders.  15. Please wear clean clothes to the hospital.

## 2023-09-22 ENCOUNTER — Emergency Department: Payer: Medicare Other

## 2023-09-22 ENCOUNTER — Observation Stay: Payer: Medicare Other

## 2023-09-22 ENCOUNTER — Inpatient Hospital Stay
Admission: EM | Admit: 2023-09-22 | Discharge: 2023-09-29 | DRG: 064 | Disposition: A | Discharge status: Home Health | Payer: Medicare Other | Attending: Student | Admitting: Student

## 2023-09-22 ENCOUNTER — Other Ambulatory Visit: Payer: Self-pay

## 2023-09-22 DIAGNOSIS — G629 Polyneuropathy, unspecified: Secondary | ICD-10-CM

## 2023-09-22 DIAGNOSIS — Z7902 Long term (current) use of antithrombotics/antiplatelets: Secondary | ICD-10-CM

## 2023-09-22 DIAGNOSIS — I63232 Cerebral infarction due to unspecified occlusion or stenosis of left carotid arteries: Principal | ICD-10-CM | POA: Diagnosis present

## 2023-09-22 DIAGNOSIS — R2990 Unspecified symptoms and signs involving the nervous system: Secondary | ICD-10-CM | POA: Diagnosis not present

## 2023-09-22 DIAGNOSIS — Z9181 History of falling: Secondary | ICD-10-CM

## 2023-09-22 DIAGNOSIS — D72829 Elevated white blood cell count, unspecified: Secondary | ICD-10-CM | POA: Insufficient documentation

## 2023-09-22 DIAGNOSIS — Z9841 Cataract extraction status, right eye: Secondary | ICD-10-CM

## 2023-09-22 DIAGNOSIS — Z9842 Cataract extraction status, left eye: Secondary | ICD-10-CM

## 2023-09-22 DIAGNOSIS — I420 Dilated cardiomyopathy: Secondary | ICD-10-CM | POA: Diagnosis present

## 2023-09-22 DIAGNOSIS — E1169 Type 2 diabetes mellitus with other specified complication: Secondary | ICD-10-CM

## 2023-09-22 DIAGNOSIS — Z7982 Long term (current) use of aspirin: Secondary | ICD-10-CM

## 2023-09-22 DIAGNOSIS — R200 Anesthesia of skin: Secondary | ICD-10-CM | POA: Diagnosis not present

## 2023-09-22 DIAGNOSIS — Z7985 Long-term (current) use of injectable non-insulin antidiabetic drugs: Secondary | ICD-10-CM

## 2023-09-22 DIAGNOSIS — Z6830 Body mass index (BMI) 30.0-30.9, adult: Secondary | ICD-10-CM

## 2023-09-22 DIAGNOSIS — E65 Localized adiposity: Secondary | ICD-10-CM | POA: Diagnosis present

## 2023-09-22 DIAGNOSIS — G4733 Obstructive sleep apnea (adult) (pediatric): Secondary | ICD-10-CM | POA: Diagnosis present

## 2023-09-22 DIAGNOSIS — E669 Obesity, unspecified: Secondary | ICD-10-CM | POA: Diagnosis present

## 2023-09-22 DIAGNOSIS — R809 Proteinuria, unspecified: Secondary | ICD-10-CM | POA: Diagnosis present

## 2023-09-22 DIAGNOSIS — I110 Hypertensive heart disease with heart failure: Secondary | ICD-10-CM | POA: Diagnosis present

## 2023-09-22 DIAGNOSIS — E1165 Type 2 diabetes mellitus with hyperglycemia: Secondary | ICD-10-CM | POA: Diagnosis present

## 2023-09-22 DIAGNOSIS — Z951 Presence of aortocoronary bypass graft: Secondary | ICD-10-CM

## 2023-09-22 DIAGNOSIS — I10 Essential (primary) hypertension: Secondary | ICD-10-CM | POA: Diagnosis present

## 2023-09-22 DIAGNOSIS — I639 Cerebral infarction, unspecified: Secondary | ICD-10-CM | POA: Diagnosis present

## 2023-09-22 DIAGNOSIS — Z79899 Other long term (current) drug therapy: Secondary | ICD-10-CM

## 2023-09-22 DIAGNOSIS — I700 Atherosclerosis of aorta: Secondary | ICD-10-CM | POA: Diagnosis present

## 2023-09-22 DIAGNOSIS — Z87820 Personal history of traumatic brain injury: Secondary | ICD-10-CM

## 2023-09-22 DIAGNOSIS — E1129 Type 2 diabetes mellitus with other diabetic kidney complication: Secondary | ICD-10-CM | POA: Diagnosis present

## 2023-09-22 DIAGNOSIS — I2510 Atherosclerotic heart disease of native coronary artery without angina pectoris: Secondary | ICD-10-CM | POA: Diagnosis present

## 2023-09-22 DIAGNOSIS — K810 Acute cholecystitis: Secondary | ICD-10-CM | POA: Diagnosis present

## 2023-09-22 DIAGNOSIS — I618 Other nontraumatic intracerebral hemorrhage: Secondary | ICD-10-CM | POA: Diagnosis present

## 2023-09-22 DIAGNOSIS — Z961 Presence of intraocular lens: Secondary | ICD-10-CM | POA: Diagnosis present

## 2023-09-22 DIAGNOSIS — E872 Acidosis, unspecified: Secondary | ICD-10-CM | POA: Diagnosis present

## 2023-09-22 DIAGNOSIS — Z8249 Family history of ischemic heart disease and other diseases of the circulatory system: Secondary | ICD-10-CM

## 2023-09-22 DIAGNOSIS — E785 Hyperlipidemia, unspecified: Secondary | ICD-10-CM | POA: Insufficient documentation

## 2023-09-22 DIAGNOSIS — E7800 Pure hypercholesterolemia, unspecified: Secondary | ICD-10-CM | POA: Diagnosis present

## 2023-09-22 DIAGNOSIS — R29705 NIHSS score 5: Secondary | ICD-10-CM | POA: Diagnosis present

## 2023-09-22 DIAGNOSIS — I6930 Unspecified sequelae of cerebral infarction: Secondary | ICD-10-CM

## 2023-09-22 DIAGNOSIS — I5022 Chronic systolic (congestive) heart failure: Secondary | ICD-10-CM | POA: Diagnosis present

## 2023-09-22 DIAGNOSIS — Z833 Family history of diabetes mellitus: Secondary | ICD-10-CM

## 2023-09-22 DIAGNOSIS — Z7984 Long term (current) use of oral hypoglycemic drugs: Secondary | ICD-10-CM

## 2023-09-22 DIAGNOSIS — Z87442 Personal history of urinary calculi: Secondary | ICD-10-CM

## 2023-09-22 DIAGNOSIS — I255 Ischemic cardiomyopathy: Secondary | ICD-10-CM | POA: Diagnosis present

## 2023-09-22 DIAGNOSIS — Z7901 Long term (current) use of anticoagulants: Secondary | ICD-10-CM

## 2023-09-22 DIAGNOSIS — Z1611 Resistance to penicillins: Secondary | ICD-10-CM | POA: Diagnosis present

## 2023-09-22 DIAGNOSIS — E119 Type 2 diabetes mellitus without complications: Secondary | ICD-10-CM

## 2023-09-22 DIAGNOSIS — N390 Urinary tract infection, site not specified: Secondary | ICD-10-CM | POA: Diagnosis present

## 2023-09-22 DIAGNOSIS — R296 Repeated falls: Secondary | ICD-10-CM | POA: Diagnosis present

## 2023-09-22 DIAGNOSIS — K8000 Calculus of gallbladder with acute cholecystitis without obstruction: Secondary | ICD-10-CM | POA: Diagnosis present

## 2023-09-22 DIAGNOSIS — E1142 Type 2 diabetes mellitus with diabetic polyneuropathy: Secondary | ICD-10-CM | POA: Diagnosis present

## 2023-09-22 DIAGNOSIS — Z794 Long term (current) use of insulin: Secondary | ICD-10-CM

## 2023-09-22 LAB — CBG MONITORING, ED
Glucose-Capillary: 155 mg/dL — ABNORMAL HIGH (ref 70–99)
Glucose-Capillary: 148 mg/dL — ABNORMAL HIGH (ref 70–99)

## 2023-09-22 LAB — CBC
HCT: 40.2 % (ref 39.0–52.0)
Hemoglobin: 13 g/dL (ref 13.0–17.0)
MCH: 29.4 pg (ref 26.0–34.0)
MCHC: 32.3 g/dL (ref 30.0–36.0)
MCV: 91 fL (ref 80.0–100.0)
Platelets: 323 10*3/uL (ref 150–400)
RBC: 4.42 MIL/uL (ref 4.22–5.81)
RDW: 15 % (ref 11.5–15.5)
WBC: 11.2 10*3/uL — ABNORMAL HIGH (ref 4.0–10.5)
nRBC: 0 % (ref 0.0–0.2)

## 2023-09-22 LAB — DIFFERENTIAL
Abs Immature Granulocytes: 0.06 10*3/uL (ref 0.00–0.07)
Basophils Absolute: 0 10*3/uL (ref 0.0–0.1)
Basophils Relative: 0 %
Eosinophils Absolute: 0.2 10*3/uL (ref 0.0–0.5)
Eosinophils Relative: 2 %
Immature Granulocytes: 1 %
Lymphocytes Relative: 18 %
Lymphs Abs: 2 10*3/uL (ref 0.7–4.0)
Monocytes Absolute: 0.8 10*3/uL (ref 0.1–1.0)
Monocytes Relative: 7 %
Neutro Abs: 8.1 10*3/uL — ABNORMAL HIGH (ref 1.7–7.7)
Neutrophils Relative %: 72 %

## 2023-09-22 LAB — URINALYSIS, ROUTINE W REFLEX MICROSCOPIC
Bacteria, UA: NONE SEEN
Bilirubin Urine: NEGATIVE
Glucose, UA: >=500 mg/dL — AB
Hgb urine dipstick: NEGATIVE
Ketones, ur: 5 mg/dL — AB
Leukocytes,Ua: NEGATIVE
Nitrite: NEGATIVE
Protein, ur: NEGATIVE mg/dL
Specific Gravity, Urine: 1.035 — ABNORMAL HIGH (ref 1.005–1.030)
pH: 5 (ref 5.0–8.0)

## 2023-09-22 LAB — COMPREHENSIVE METABOLIC PANEL
ALT: 33 U/L (ref 0–44)
AST: 25 U/L (ref 15–41)
Albumin: 3.4 g/dL — ABNORMAL LOW (ref 3.5–5.0)
Alkaline Phosphatase: 96 U/L (ref 38–126)
Anion gap: 13 (ref 5–15)
BUN: 23 mg/dL (ref 8–23)
CO2: 18 mmol/L — ABNORMAL LOW (ref 22–32)
Calcium: 8.4 mg/dL — ABNORMAL LOW (ref 8.9–10.3)
Chloride: 103 mmol/L (ref 98–111)
Creatinine, Ser: 0.58 mg/dL — ABNORMAL LOW (ref 0.61–1.24)
GFR, Estimated: >60 mL/min (ref >60)
Glucose, Bld: 125 mg/dL — ABNORMAL HIGH (ref 70–99)
Potassium: 4.1 mmol/L (ref 3.5–5.1)
Sodium: 134 mmol/L — ABNORMAL LOW (ref 135–145)
Total Bilirubin: 0.8 mg/dL (ref <1.2)
Total Protein: 7.2 g/dL (ref 6.5–8.1)

## 2023-09-22 LAB — URINE DRUG SCREEN, QUALITATIVE (ARMC ONLY)
Amphetamines, Ur Screen: NOT DETECTED
Barbiturates, Ur Screen: NOT DETECTED
Benzodiazepine, Ur Scrn: NOT DETECTED
Cannabinoid 50 Ng, Ur ~~LOC~~: NOT DETECTED
Cocaine Metabolite,Ur ~~LOC~~: NOT DETECTED
MDMA (Ecstasy)Ur Screen: NOT DETECTED
Methadone Scn, Ur: NOT DETECTED
Opiate, Ur Screen: NOT DETECTED
Phencyclidine (PCP) Ur S: NOT DETECTED
Tricyclic, Ur Screen: NOT DETECTED

## 2023-09-22 MED ORDER — ACETAMINOPHEN 650 MG RE SUPP: 650.0000 mg | RECTAL | Status: DC | PRN

## 2023-09-22 MED ORDER — SODIUM CHLORIDE 0.9 % IV SOLN: INTRAVENOUS | Status: DC

## 2023-09-22 MED ORDER — INSULIN ASPART 100 UNIT/ML IJ SOLN
0.0000 [IU] | Freq: Three times a day (TID) | INTRAMUSCULAR | Status: DC
Start: 2023-09-23 — End: 2023-09-29
  Administered 2023-09-23 – 2023-09-24 (×2): 3 [IU] via SUBCUTANEOUS
  Administered 2023-09-24: 5 [IU] via SUBCUTANEOUS
  Administered 2023-09-25: 3 [IU] via SUBCUTANEOUS
  Administered 2023-09-25 (×2): 2 [IU] via SUBCUTANEOUS
  Administered 2023-09-26: 3 [IU] via SUBCUTANEOUS
  Administered 2023-09-26: 2 [IU] via SUBCUTANEOUS
  Administered 2023-09-27 (×3): 3 [IU] via SUBCUTANEOUS
  Administered 2023-09-28: 5 [IU] via SUBCUTANEOUS
  Administered 2023-09-29: 2 [IU] via SUBCUTANEOUS
  Filled 2023-09-22 (×13): qty 1

## 2023-09-22 MED ORDER — SODIUM BICARBONATE 650 MG PO TABS
650.0000 mg | ORAL_TABLET | Freq: Two times a day (BID) | ORAL | Status: AC
  Administered 2023-09-22 – 2023-09-23 (×2): 650 mg via ORAL
  Filled 2023-09-22 (×2): qty 1

## 2023-09-22 MED ORDER — SENNOSIDES-DOCUSATE SODIUM 8.6-50 MG PO TABS: 1.0000 | ORAL_TABLET | Freq: Every evening | ORAL | Status: DC | PRN

## 2023-09-22 MED ORDER — ATORVASTATIN CALCIUM 80 MG PO TABS
80.0000 mg | ORAL_TABLET | Freq: Every day | ORAL | Status: DC
  Administered 2023-09-22 – 2023-09-28 (×7): 80 mg via ORAL
  Filled 2023-09-22 (×6): qty 4
  Filled 2023-09-22: qty 1

## 2023-09-22 MED ORDER — STROKE: EARLY STAGES OF RECOVERY BOOK: Freq: Once | Status: AC

## 2023-09-22 MED ORDER — ASPIRIN 81 MG PO TBEC: 81.0000 mg | DELAYED_RELEASE_TABLET | Freq: Every day | ORAL | Status: DC

## 2023-09-22 MED ORDER — CARVEDILOL 3.125 MG PO TABS
1.5600 mg | ORAL_TABLET | Freq: Two times a day (BID) | ORAL | Status: DC
  Administered 2023-09-23 – 2023-09-25 (×5): 1.56 mg via ORAL
  Filled 2023-09-22 (×5): qty 1

## 2023-09-22 MED ORDER — CLOPIDOGREL BISULFATE 75 MG PO TABS
75.0000 mg | ORAL_TABLET | Freq: Every day | ORAL | Status: DC
  Administered 2023-09-22 – 2023-09-29 (×7): 75 mg via ORAL
  Filled 2023-09-22 (×7): qty 1

## 2023-09-22 MED ORDER — ACETAMINOPHEN 160 MG/5ML PO SOLN: 650.0000 mg | ORAL | Status: DC | PRN

## 2023-09-22 MED ORDER — CARVEDILOL 3.125 MG PO TABS: 3.1250 mg | ORAL_TABLET | Freq: Two times a day (BID) | ORAL | Status: DC

## 2023-09-22 MED ORDER — IOHEXOL 350 MG/ML SOLN
75.0000 mL | Freq: Once | INTRAVENOUS | Status: AC | PRN
  Administered 2023-09-22: 75 mL via INTRAVENOUS

## 2023-09-22 MED ORDER — ACETAMINOPHEN 325 MG PO TABS
650.0000 mg | ORAL_TABLET | ORAL | Status: DC | PRN
  Administered 2023-09-24 – 2023-09-28 (×2): 650 mg via ORAL
  Filled 2023-09-22 (×2): qty 2

## 2023-09-22 MED ORDER — INSULIN ASPART 100 UNIT/ML IJ SOLN
0.0000 [IU] | Freq: Every day | INTRAMUSCULAR | Status: DC
Start: 2023-09-22 — End: 2023-09-29
  Administered 2023-09-24: 2 [IU] via SUBCUTANEOUS
  Filled 2023-09-22: qty 1

## 2023-09-22 MED ORDER — INSULIN GLARGINE-YFGN 100 UNIT/ML ~~LOC~~ SOLN
20.0000 [IU] | Freq: Every day | SUBCUTANEOUS | Status: DC
  Administered 2023-09-23 – 2023-09-28 (×6): 20 [IU] via SUBCUTANEOUS
  Filled 2023-09-22 (×10): qty 0.2

## 2023-09-22 MED ORDER — HYDRALAZINE HCL 20 MG/ML IJ SOLN: 5.0000 mg | Freq: Four times a day (QID) | INTRAMUSCULAR | Status: AC | PRN

## 2023-09-22 MED ORDER — ASPIRIN 81 MG PO TBEC
81.0000 mg | DELAYED_RELEASE_TABLET | Freq: Every day | ORAL | Status: DC
  Administered 2023-09-22 – 2023-09-29 (×7): 81 mg via ORAL
  Filled 2023-09-22 (×7): qty 1

## 2023-09-22 NOTE — Discharge Instructions (Addendum)
Drain instructions: --continue drain management as you have been doing at home:  flush drain once daily with 5 ml of NS, dressing changes daily, emptying/recording output daily.  --Some days will have higher output, some days will have lower output. --Interventional Radiology will be in touch with you to set up follow up for drain exchange and to discuss Spyglass procedure.

## 2023-09-22 NOTE — ED Notes (Signed)
Answered call light, assisted pt to the bathroom.

## 2023-09-22 NOTE — Assessment & Plan Note (Addendum)
A1c was 6.7 on 07/29/23 Insulin SSI with at bedtime coverage ordered

## 2023-09-22 NOTE — Assessment & Plan Note (Signed)
Mild leukocytosis, suspect reactive in setting of stroke Recheck CBC in the a.m.

## 2023-09-22 NOTE — Assessment & Plan Note (Signed)
Home Coreg 1.6 mg p.o. twice daily with meals resumed

## 2023-09-22 NOTE — ED Notes (Signed)
Attempted urine specimen, stood pt beside the bed but pt felt unstable and wanted a wheelchair to get to the restroom due to his instability since I could not leave him alone for safety. Pt does not have urgency to urinate and MRI was waiting outside the door, I told the pt to hold off on the urine so MRI could take him first.

## 2023-09-22 NOTE — Assessment & Plan Note (Addendum)
Left frontal and parietal convexity with scattered foci, concerning for left-sided emboli or watershed infarct Largest infarct involving the left postcentral gyrus, can account for the right hand numbness Focal petechial hemorrhage associated with infarct of the left close central gyrus Neurology has been consulted and we appreciate further recommendations Bubble study ordered complete echo Fasting lipid Outside window for permissive hypertension Frequent neuro vascular checks N.p.o. pending swallow study PT, OT Fall precaution  Addendum: Per neurology recommendation initiate Plavix 75 mg daily, in addition to continuing aspirin 81 mg daily.  CTA head and neck with and without contrast for stroke workup.  Complete echo ordered.

## 2023-09-22 NOTE — H&P (Addendum)
History and Physical   Alexander [name] ZSW:[id number] DOB: 1951/01/09 DOA: 09/22/2023  PCP: Myrene Buddy, NP  Outpatient Specialists: Dr. Aleen Campi Patient coming from: Home  I have personally briefly reviewed patient's old medical records in [facility] EMR.  Chief Concern: Right hand numbness  HPI: [name] is a 67 male with history of hyperlipidemia, possible history of TIA, insulin-dependent diabetes melitis type II, hypertension, history of acute cholecystitis, who presents emergency department for chief concerns of right hand numbness for 2 to 3 weeks.  Vitals in the ED showed temperature of 97.7, respiration rate of 16, heart rate 89, blood pressure 112/64, SpO2 of 97% on RA.  Serum sodium is 134, potassium 4.1, chloride 103, bicarb 18, BUN of 23, serum creatinine 0.58, EGFR greater than 60, nonfasting glucose 125, WBC 11.2, hemoglobin 13, platelets of 323.  UA was positive for increased glucose, ketones.  UDS was negative.  ED treatment: None. ---------------------------------- At bedside, patient was able to tell me his name, age, location, current calendar year.  He reports he had right hand numbness especially in the first second and third digits that started about 2 to 3 weeks ago.  It has gradually worsened, prompting him to present to the emergency department for further evaluation as he is supposed to have cholecystectomy next week.  He wanted to figure this out before the surgery.  He denies chest pain, shortness of breath, dysuria, hematuria, syncope, swelling of his lower extremities, loss of consciousness, nausea, vomiting, fever, chills.  He endorses right upper quadrant area abdominal tenderness at this site of his drain for the gallbladder.  Social history: He lives at home with his wife, Larita Fife.  He denies tobacco, EtOH, recreational drug use.  He is semiretired, and has a mowing business.  ROS: Constitutional: no weight change, no fever ENT/Mouth:  no sore throat, no rhinorrhea Eyes: no eye pain, no vision changes Cardiovascular: no chest pain, no dyspnea,  no edema, no palpitations Respiratory: no cough, no sputum, no wheezing Gastrointestinal: no nausea, no vomiting, no diarrhea, no constipation Genitourinary: no urinary incontinence, no dysuria, no hematuria Musculoskeletal: no arthralgias, no myalgias, + numbness of his right hand Skin: no skin lesions, no pruritus, Neuro: + weakness, no loss of consciousness, no syncope Psych: no anxiety, no depression, + decrease appetite Heme/Lymph: no bruising, no bleeding  ED Course: Discussed with EDP, patient requiring hospitalization for chief concerns of stroke.  Assessment/Plan  Principal Problem:   Stroke-like symptom Active Problems:   Acute cholecystitis   Hypertension   Metabolic acidosis   Insulin dependent type 2 diabetes mellitus (HCC)   Truncal obesity   Cardiomyopathy, dilated (HCC)   Microalbuminuria due to type 2 diabetes mellitus (HCC)   Polyneuropathy   Embolic stroke (HCC)   Hyperlipidemia   Leukocytosis   Assessment and Plan:  * Stroke-like symptom Left frontal and parietal convexity with scattered foci, concerning for left-sided emboli or watershed infarct Largest infarct involving the left postcentral gyrus, can account for the right hand numbness Focal petechial hemorrhage associated with infarct of the left close central gyrus Neurology has been consulted and we appreciate further recommendations Bubble study ordered complete echo Fasting lipid Outside window for permissive hypertension Frequent neuro vascular checks N.p.o. pending swallow study PT, OT Fall precaution  Addendum: Per neurology recommendation initiate Plavix 75 mg daily, in addition to continuing aspirin 81 mg daily.  CTA head and neck with and without contrast for stroke workup.  Complete echo ordered.  Acute cholecystitis  Drain is in place, continue outpatient follow-up with  general surgery  Metabolic acidosis Sodium bicarbonate 650 mg p.o. twice daily, 2 doses ordered on admission  Hypertension Home Coreg 1.6 mg p.o. twice daily with meals resumed  Insulin dependent type 2 diabetes mellitus (HCC) A1c was 6.7 on 07/29/23 Insulin SSI with at bedtime coverage ordered  Leukocytosis Mild leukocytosis, suspect reactive in setting of stroke Recheck CBC in the a.m.  Hyperlipidemia Atorvastatin 80 mg nightly resumed on admission  Chart reviewed.   DVT prophylaxis: TED hose; AM team to initiate pharmacologic DVT prophylaxis when the benefits outweigh the risk Code Status: Full code Diet: Heart healthy/carb modified Family Communication: Updated family, spouse and daughter Revonda Standard at bedside with patient's permission Disposition Plan: Pending clinical course Consults called: Neurology Admission status: Telemetry medical, observation  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)    Pneumonia 07/05/2023   admitted to [facility]   Polyneuropathy    S/P CABG x 5 2017   Sepsis (HCC) 07/05/2023   Severe nonproliferative diabetic retinopathy of left eye (HCC)    Stroke (HCC) 2014   mini, tingling right hand   Past Surgical History:  Procedure Laterality Date   CARDIAC CATHETERIZATION Left 08/09/2016   Procedure: Left Heart Cath and Coronary Angiography;  Surgeon: Dalia Heading, MD;  Location: [facility] INVASIVE CV LAB;  Service: Cardiovascular;  Laterality: Left;   CATARACT EXTRACTION W/PHACO Right  02/06/2023   Procedure: CATARACT EXTRACTION PHACO AND INTRAOCULAR LENS PLACEMENT (IOC) AHMED TUBE SHUNT W/ TUTOPLAST RIGHT DIABETIC  3.94   00:42.6;  Surgeon: Nevada Crane, MD;  Location: [facility];  Service: Ophthalmology;  Laterality: Right;   COLONOSCOPY WITH PROPOFOL N/A 02/02/2021   Procedure: COLONOSCOPY WITH PROPOFOL;  Surgeon: Regis Bill, MD;  Location: [facility] ENDOSCOPY;  Service: Endoscopy;  Laterality: N/A;   CORONARY ARTERY BYPASS GRAFT  2017   x5   IR EXCHANGE BILIARY DRAIN  09/01/2023   IR PERC CHOLECYSTOSTOMY  07/28/2023   Social History:  reports that he has never smoked. He has never been exposed to tobacco smoke. He has never used smokeless tobacco. He reports that he does not drink alcohol and does not use drugs.  No Known Allergies Family History  Problem Relation Age of Onset   Hypertension Mother    Diabetes Mother    Family history: Family history reviewed and not pertinent.  Prior to Admission medications   Medication Sig Start Date End Date Taking? Authorizing Provider  acetaminophen (TYLENOL) 500 MG tablet Take 1,000 mg by mouth every 6 (six) hours as needed for mild pain or headache.    [provider]  aspirin EC 81 MG tablet Take 81 mg by mouth daily.    [provider]  atorvastatin (LIPITOR) 80 MG tablet Take 1 tablet (80 mg total) by mouth daily. Hold until you see your doctor and have your liver enzymes repeated Patient not taking:  Reported on 09/20/2023 07/30/23   Arnetha Courser, MD  carvedilol (COREG) 3.125 MG tablet Take 3.125 mg by mouth 2 (two) times daily with a meal.    [provider]  empagliflozin (JARDIANCE) 25 MG TABS tablet Take 25 mg by mouth daily. 12/27/18   [provider]  feeding supplement (ENSURE ENLIVE / ENSURE PLUS) LIQD Take 237 mLs by mouth 2 (two) times daily between meals. 07/30/23   Arnetha Courser, MD  LANTUS SOLOSTAR 100 UNIT/ML Solostar Pen Inject 38 Units into the skin at  bedtime. 07/14/23   [provider]  OZEMPIC, 2 MG/DOSE, 8 MG/3ML SOPN Inject 2 mg into the skin every 7 (seven) days. Sundays    [provider]  sacubitril-valsartan (ENTRESTO) 24-26 MG Take 1 tablet by mouth every 12 (twelve) hours. Patient not taking: Reported on 09/15/2023 07/25/23   [provider]  sodium chloride flush (NS) 0.9 % SOLN 5 mLs by Intracatheter route daily. 08/16/23 10/15/23  Henrene Dodge, MD   Physical Exam: Vitals:   09/22/23 1817 09/22/23 1817 09/22/23 1818 09/22/23 1839  BP:  118/70 118/70   Pulse:  91 81   Resp: 20  20   Temp:   98 F (36.7 C)   TempSrc:   Oral   SpO2: 100% 100% 98%   Weight:    101.6 kg  Height:       Constitutional: appears age-appropriate, NAD, calm Eyes: PERRL, lids and conjunctivae normal ENMT: Mucous membranes are moist. Posterior pharynx clear of any exudate or lesions. Age-appropriate dentition. Hearing appropriate Neck: normal, supple, no masses, no thyromegaly Respiratory: clear to auscultation bilaterally, no wheezing, no crackles. Normal respiratory effort. No accessory muscle use.  Cardiovascular: Regular rate and rhythm, no murmurs / rubs / gallops. No extremity edema. 2+ pedal pulses. No carotid bruits.  Abdomen: Obese abdomen, no tenderness, no masses palpated, no hepatosplenomegaly. Bowel sounds positive.  Drain in place at the right upper quadrant abdominal Musculoskeletal: no clubbing / cyanosis. No joint deformity upper and lower extremities. Good ROM, no contractures, no atrophy. Normal muscle tone.  Skin: no rashes, lesions, ulcers. No induration Neurologic: Sensation intact. Strength 5/5 in all 4.  Psychiatric: Normal judgment and insight. Alert and oriented x 3. Normal mood.   EKG: independently reviewed, showing sinus rhythm with a rate of 89, QTc 501  Chest x-ray on Admission: I personally reviewed and I agree with radiologist reading as below.  MR BRAIN WO CONTRAST  Result Date:  09/22/2023 CLINICAL DATA:  Right hand numbness over the last 2-3 weeks. Neuro deficit, acute, stroke suspected. EXAM: MRI HEAD WITHOUT CONTRAST TECHNIQUE: Multiplanar, multiecho pulse sequences of the brain and surrounding structures were obtained without intravenous contrast. COMPARISON:  CT head without contrast 01/15/2019. FINDINGS: Brain: Scattered foci of restricted diffusion are present over the left frontal and parietal convexity. Largest in scratched at the large cortical infarct involves the postcentral gyrus, likely accounting for the right hand numbness. Whether cortical and subcortical foci are present. T2 and FLAIR hyperintensities are associated with the areas of acute/subacute infarction. Remote lacunar infarcts are present within the centrum semiovale bilaterally. Focal petechial hemorrhage is associated with the infarct of the left postcentral gyrus. Remote lacunar infarcts of the left thalamus are again noted. White matter changes extend into the brainstem. Vascular: Flow is present in the major intracranial arteries. Skull and upper cervical spine: The craniocervical junction is normal. Upper cervical spine is within normal limits. Marrow signal is unremarkable. Sinuses/Orbits: The paranasal sinuses and mastoid  air cells are clear. Right lens replacement is present. Globes and orbits are otherwise within normal limits. IMPRESSION: 1. Scattered foci of acute/subacute infarction over the left frontal and parietal convexity. This may represent left-sided emboli or a watershed infarct. 2. The largest infarct involves the left postcentral gyrus, likely accounting for the right hand numbness. 3. Focal petechial hemorrhage associated with the infarct of the left postcentral gyrus. 4. Remote lacunar infarcts of the centrum semiovale bilaterally and left thalamus. 5. Underlying small vessel ischemic disease. These results were called by telephone at the time of interpretation on 09/22/2023 at 5:18 pm to  provider [facility] , who verbally acknowledged these results. Electronically Signed   By: Marin Roberts M.D.   On: 09/22/2023 17:18    Labs on Admission: I have personally reviewed following labs CBC: Recent Labs  Lab 09/22/23 1558  WBC 11.2*  NEUTROABS 8.1*  HGB 13.0  HCT 40.2  MCV 91.0  PLT 323   Basic Metabolic Panel: Recent Labs  Lab 09/22/23 1558  NA 134*  K 4.1  CL 103  CO2 18*  GLUCOSE 125*  BUN 23  CREATININE 0.58*  CALCIUM 8.4*   GFR: Estimated Creatinine Clearance: 104.5 mL/min (A) (by C-G formula based on SCr of 0.58 mg/dL (L)).  Liver Function Tests: Recent Labs  Lab 09/22/23 1558  AST 25  ALT 33  ALKPHOS 96  BILITOT 0.8  PROT 7.2  ALBUMIN 3.4*   Urine analysis:    Component Value Date/Time   COLORURINE YELLOW (A) 09/22/2023 1641   APPEARANCEUR HAZY (A) 09/22/2023 1641   LABSPEC 1.035 (H) 09/22/2023 1641   PHURINE 5.0 09/22/2023 1641   GLUCOSEU >=500 (A) 09/22/2023 1641   HGBUR NEGATIVE 09/22/2023 1641   BILIRUBINUR NEGATIVE 09/22/2023 1641   KETONESUR 5 (A) 09/22/2023 1641   PROTEINUR NEGATIVE 09/22/2023 1641   NITRITE NEGATIVE 09/22/2023 1641   LEUKOCYTESUR NEGATIVE 09/22/2023 1641   This document was prepared using Dragon Voice Recognition software and may include unintentional dictation errors.  Dr. Sedalia Muta [facility]  If 7PM-7AM, please contact overnight-coverage provider If 7AM-7PM, please contact day attending provider [email or web address]  09/22/2023, 7:22 PM

## 2023-09-22 NOTE — ED Triage Notes (Signed)
Pt reports that he has had right hand numbness for the past 2-3 weeks, denies any known neck injury in the past, states that he is scheduled to have surg for his gallbladder on Wednesday and wanted to get this checked and fixed before his surg, pt currently wearing a drainage pouch for his gallbladder

## 2023-09-22 NOTE — Hospital Course (Signed)
Mr. Rishith Icaza is a 62 male with history of hyperlipidemia, possible history of TIA, insulin-dependent diabetes melitis type II, hypertension, history of acute cholecystitis, who presents emergency department for chief concerns of right hand numbness for 2 to 3 weeks.  Vitals in the ED showed temperature of 97.7, respiration rate of 16, heart rate 89, blood pressure 112/64, SpO2 of 97% on RA.  Serum sodium is 134, potassium 4.1, chloride 103, bicarb 18, BUN of 23, serum creatinine 0.58, EGFR greater than 60, nonfasting glucose 125, WBC 11.2, hemoglobin 13, platelets of 323.  UA was positive for increased glucose, ketones.  UDS was negative.  ED treatment: None.

## 2023-09-22 NOTE — Assessment & Plan Note (Signed)
Sodium bicarbonate 650 mg p.o. twice daily, 2 doses ordered on admission

## 2023-09-22 NOTE — Assessment & Plan Note (Signed)
Drain is in place, continue outpatient follow-up with general surgery

## 2023-09-22 NOTE — Assessment & Plan Note (Signed)
Atorvastatin 80 mg nightly resumed on admission

## 2023-09-22 NOTE — ED Provider Notes (Addendum)
[facility] Provider Note    Event Date/Time   First MD Initiated Contact with Patient 09/22/23 1522     (approximate)   History   hand numbness   HPI  Alexander [name] is a 72 y.o. male   Past medical history of CAD status post CABG, CHF, frequent falls, hypertension, medical history of prior stroke in 2014 "mini, tingling right hand" on aspirin who presents to the emergency department with right hand tingling.   This all started 3 weeks ago.  He was sitting on the couch and felt that his right leg fell asleep, felt numb and he tried to walk but this leg gave way causing a fall onto his right side.  He fell onto his right arm and leg.  The numbness in his legs subsided spontaneously.  He did not strike his head or lose consciousness.  He does not take blood thinners.  Since that time he has intermittent tingling sensation to the right hand, numbness sensation, and weaker grip strength, he feels that it affects his thumb through third digit more to the fourth and fifth digits.  He has had 2 other falls since then, both mechanical in nature as he slipped and fell both times.  No significant injuries from those falls.  He has an upcoming gallbladder surgery.  He denies any new or worsening of those symptoms related to abdominal pain nausea or vomiting.  Independent Historian contributed to assessment above: His daughter and wife are at bedside to corroborate information past medical history above  External Medical Documents Reviewed: Chart summary from hospitalization in September 2024 for community-acquired pneumonia at that time noted a history of "CVA with residual right-sided deficits"  Note from [facility] neuro Dr Sherryll Burger in 2014 -- " The patient has had mild, positive right-sided sensory abnormalities since 03/2013. On exam, the patient has the previously noted positive sensory deficits and is otherwise nonfocal with the exception of a right foot drop which is an old  deficit. CT and MRI of the brain completed on 04/17/13 showed no evidence of an acute infarct or hemorrhage. The differential includes neuropathy, tickborne vector related sensory disturbance, and very small stroke. Initial workup for neuropathy and tickborne vectors was unremarkable. Most likely the patient has had a very small stroke of the right thalamus that only mild, positive sensory symptoms in the right face and hand are present. Given his known risk factors of age, hypertension, and diabetes it would be reasonable to pursue a stroke workup and assess/treat risk factors for secondary stroke prevention. He is currently on an antiplatelet agent with Aspirin 81mg  qd "     Physical Exam   Triage Vital Signs: ED Triage Vitals  Encounter Vitals Group     BP 09/22/23 1432 112/64     Systolic BP Percentile --      Diastolic BP Percentile --      Pulse Rate 09/22/23 1432 89     Resp 09/22/23 1432 16     Temp 09/22/23 1432 97.7 F (36.5 C)     Temp Source 09/22/23 1432 Oral     SpO2 09/22/23 1432 97 %     Weight 09/22/23 1433 224 lb (101.6 kg)     Height 09/22/23 1433 6' (1.829 m)     Head Circumference --      Peak Flow --      Pain Score 09/22/23 1433 0     Pain Loc --  Pain Education --      Exclude from Growth Chart --     Most recent vital signs: Vitals:   09/22/23 1715 09/22/23 1730  BP:    Pulse: 90 89  Resp:    Temp:    SpO2: 99% 100%    General: Awake, no distress.  CV:  Good peripheral perfusion.  Resp:  Normal effort.  Abd:  No distention.  Other:  Awake alert comfortable appearing pleasant gentleman in no acute distress with normal vital signs.  Very mildly decreased grip strength in the right hand and subjective numbness, mild to the 3rd through 1st digit.  He has some mild right sided lower extremity weakness compared to the left.  No dysarthria facial asymmetry finger-to-nose is normal.  Gait is at baseline, uses walker.   ED Results / Procedures /  Treatments   Labs (all labs ordered are listed, but only abnormal results are displayed) Labs Reviewed  URINALYSIS, ROUTINE W REFLEX MICROSCOPIC - Abnormal; Notable for the following components:      Result Value   Color, Urine YELLOW (*)    APPearance HAZY (*)    Specific Gravity, Urine 1.035 (*)    Glucose, UA >=500 (*)    Ketones, ur 5 (*)    All other components within normal limits  URINE DRUG SCREEN, QUALITATIVE ([facility] ONLY)  CBC  DIFFERENTIAL  COMPREHENSIVE METABOLIC PANEL     I ordered and reviewed the above labs they are notable for normal-appearing urinalysis, glucose in the urine, no infection  ED ECG REPORT I, Pilar Jarvis, the attending physician, personally viewed and interpreted this ECG.   Date: 09/22/2023  EKG Time: 1736  Rate: 89  Rhythm: sinus   Axis: nl  Intervals: prolonged QTC  ST&T Change: no stemi   RADIOLOGY I independently reviewed and interpreted MRI of the brain and see no obvious head bleed large ischemic changes or midline shift I also reviewed radiologist's formal read.   PROCEDURES:  Critical Care performed: No  Procedures   MEDICATIONS ORDERED IN ED: Medications - No data to display  External physician / consultants:  I spoke with hospitalist for admission and regarding care plan for this patient.   IMPRESSION / MDM / ASSESSMENT AND PLAN / ED COURSE  I reviewed the triage vital signs and the nursing notes.                                Patient's presentation is most consistent with acute presentation with potential threat to life or bodily function.  Differential diagnosis includes, but is not limited to, CVA, peripheral neuropathies, carpal tunnel syndrome, blunt traumatic injury including head bleeds, fractures dislocations,   The patient is on the cardiac monitor to evaluate for evidence of arrhythmia and/or significant heart rate changes.  MDM:    His episodes of numbness that had spontaneously resolved 3 weeks ago  in the setting of sitting on a couch most likely do not represent a stroke, however with mild weakness and sensory changes in the left hand as well we will check for acute stroke with stroke imaging MRI Brain.   --- MRI shows acute to subacute strokes left-sided hemisphere with petechial hemorrhage which correlate with his symptoms of right sided numbness/weakness.  Outside of window for thrombolytics, 3 weeks onset of symptoms last known normal.  Will admit, further stroke workup.       FINAL CLINICAL IMPRESSION(S) / ED  DIAGNOSES   Final diagnoses:  Hand numbness  Frequent falls  Cerebrovascular accident (CVA), unspecified mechanism (HCC)     Rx / DC Orders   ED Discharge Orders     None        Note:  This document was prepared using Dragon voice recognition software and may include unintentional dictation errors.    Pilar Jarvis, MD 09/22/23 1719    Pilar Jarvis, MD 09/22/23 1739    Pilar Jarvis, MD 09/22/23 [phone]

## 2023-09-22 NOTE — ED Notes (Signed)
Pt would like to notify medical staff of an open sore he has at the upper Lt gluteal cleft, pain on palp but not constant, pt states he is able to sit on it., upon inspection it looks like a skin abrasion 0.5cm measured.

## 2023-09-23 ENCOUNTER — Observation Stay
Admit: 2023-09-23 | Discharge: 2023-09-23 | Disposition: A | Discharge status: Home / Self Care | Payer: Medicare Other | Attending: Internal Medicine

## 2023-09-23 DIAGNOSIS — R2990 Unspecified symptoms and signs involving the nervous system: Secondary | ICD-10-CM | POA: Diagnosis not present

## 2023-09-23 DIAGNOSIS — R200 Anesthesia of skin: Secondary | ICD-10-CM | POA: Diagnosis not present

## 2023-09-23 DIAGNOSIS — I639 Cerebral infarction, unspecified: Secondary | ICD-10-CM | POA: Diagnosis not present

## 2023-09-23 LAB — CBG MONITORING, ED
Glucose-Capillary: 112 mg/dL — ABNORMAL HIGH (ref 70–99)
Glucose-Capillary: 157 mg/dL — ABNORMAL HIGH (ref 70–99)

## 2023-09-23 LAB — LIPID PANEL
Cholesterol: 95 mg/dL (ref 0–200)
HDL: 25 mg/dL — ABNORMAL LOW (ref >40)
LDL Cholesterol: 54 mg/dL (ref 0–99)
Total CHOL/HDL Ratio: 3.8 {ratio}
Triglycerides: 81 mg/dL (ref <150)
VLDL: 16 mg/dL (ref 0–40)

## 2023-09-23 LAB — GLUCOSE, CAPILLARY: Glucose-Capillary: 184 mg/dL — ABNORMAL HIGH (ref 70–99)

## 2023-09-23 MED ORDER — ENOXAPARIN SODIUM 60 MG/0.6ML IJ SOSY
0.5000 mg/kg | PREFILLED_SYRINGE | INTRAMUSCULAR | Status: DC
  Administered 2023-09-23 – 2023-09-28 (×6): 50 mg via SUBCUTANEOUS
  Filled 2023-09-23 (×6): qty 0.6

## 2023-09-23 MED ORDER — PERFLUTREN LIPID MICROSPHERE
1.0000 mL | INTRAVENOUS | Status: AC | PRN
  Administered 2023-09-23: 3 mL via INTRAVENOUS

## 2023-09-23 MED ORDER — ENOXAPARIN SODIUM 40 MG/0.4ML IJ SOSY: 40.0000 mg | PREFILLED_SYRINGE | INTRAMUSCULAR | Status: DC

## 2023-09-23 NOTE — Progress Notes (Signed)
SLP Cancellation Note  Patient Details Name: Alexander [name] MRN: [id number] DOB: 1951-09-25   Cancelled treatment:       Reason Eval/Treat Not Completed: SLP screened, no needs identified, will sign off (chart reviewed; consulted NSG then met w/ pt in room)  Pt denied any difficulty swallowing and is currently on a regular diet; tolerates swallowing pills w/ water per NSG. Pt feeding self last of his breakfast meal w/out difficulty.  Pt conversed in full conversation w/out overt expressive/receptive deficits noted; pt denied any speech-language deficits. Speech clear. Pt recounted the events that brought him to the hospital describing weakness in the RUE but also a recent fall s/p his RLE "going out on me when I stood up from my chair". 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 No further skilled ST services indicated as pt appears at his communication baseline. Pt agreed. NSG to reconsult if any change in status while admitted.      Jerilynn Som, MS, CCC-SLP Speech Language Pathologist Rehab Services; [facility] [phone] (ascom) Totiana Everson 09/23/2023, 8:29 AM

## 2023-09-23 NOTE — Progress Notes (Signed)
  PROGRESS NOTE    Alexander [name]  GNF:[id number] DOB: 1950/11/30 DOA: 09/22/2023 PCP: Myrene Buddy, NP  [id number]/[id number]  LOS: 0 days   Brief hospital course:   Assessment & Plan: Alexander [name] is a 7 male with history of hyperlipidemia, possible history of TIA, insulin-dependent diabetes melitis type II, hypertension, history of acute cholecystitis, who presents emergency department for chief concerns of right hand numbness for 2 to 3 weeks.    * Acute stroke --MRI showed "Scattered foci of acute/subacute infarction over the left frontal and parietal convexity. This may represent left-sided emboli or a watershed infarct."  CTA head/neck showed "Bulky calcified plaque about the carotid bulbs with associated stenosis of up to 60% bilaterally." Plan: --formal neuro consult today --Echo --cont ASA and plavix  Hx of Acute cholecystitis s/p chole drain Drain is in place.  Has scheduled cholecystectomy on 09/28/23. --will likely need to postpone surgery.  Metabolic acidosis --s/p Sodium bicarbonate 650 mg p.o. twice daily, x 2 doses ordered on admission --monitor  Hypertension --cont Home Coreg 1.6 mg p.o. twice daily with meals   Insulin dependent type 2 diabetes mellitus (HCC) A1c was 6.7 on 07/29/23 --cont glargine 20u nightly --ACHS and SSI  Hyperlipidemia --cont Atorvastatin 80 mg nightly    DVT prophylaxis: Lovenox SQ Code Status: Full code  Family Communication:  Level of care: Telemetry Medical Dispo:   The patient is from: home Anticipated d/c is to: home Anticipated d/c date is: to be determined Patient currently is not medically ready to d/c due to: pending neuro eval and rec   Subjective and Interval History:  Pt reported some difficulty with right hand fine movement.   Objective: Vitals:   09/23/23 1400 09/23/23 1635 09/23/23 1713 09/23/23 1800  BP: 116/77   120/68  Pulse: 88  96 97  Resp: (!) 21   20  Temp:  98 F (36.7 C)    TempSrc:  Oral     SpO2: 100%  100% 95%  Weight:      Height:        Intake/Output Summary (Last 24 hours) at 09/23/2023 1926 Last data filed at 09/23/2023 0803 Gross per 24 hour  Intake 1352.9 ml  Output --  Net 1352.9 ml   Filed Weights   09/22/23 1433 09/22/23 1839  Weight: 101.6 kg 101.6 kg    Examination:   Constitutional: NAD, AAOx3 HEENT: conjunctivae and lids normal, EOMI CV: No cyanosis.   RESP: normal respiratory effort, on RA Neuro: II - XII grossly intact.   Psych: Normal mood and affect.  Appropriate judgement and reason   Data Reviewed: I have personally reviewed labs and imaging studies  Time spent: 50 minutes  Darlin Priestly, MD [facility] If 7PM-7AM, please contact night-coverage 09/23/2023, 7:26 PM

## 2023-09-23 NOTE — Plan of Care (Signed)
  Problem: Education: Goal: Knowledge of disease or condition will improve Outcome: Progressing Goal: Knowledge of secondary prevention will improve (MUST DOCUMENT ALL) Outcome: Progressing Goal: Knowledge of patient specific risk factors will improve Loraine Leriche N/A or DELETE if not current risk factor) Outcome: Progressing   Problem: Ischemic Stroke/TIA Tissue Perfusion: Goal: Complications of ischemic stroke/TIA will be minimized Outcome: Progressing   Problem: Coping: Goal: Will verbalize positive feelings about self Outcome: Progressing Goal: Will identify appropriate support needs Outcome: Progressing   Problem: Health Behavior/Discharge Planning: Goal: Ability to manage health-related needs will improve Outcome: Progressing Goal: Goals will be collaboratively established with patient/family Outcome: Progressing   Problem: Self-Care: Goal: Ability to participate in self-care as condition permits will improve Outcome: Progressing Goal: Verbalization of feelings and concerns over difficulty with self-care will improve Outcome: Progressing Goal: Ability to communicate needs accurately will improve Outcome: Progressing   Problem: Nutrition: Goal: Risk of aspiration will decrease Outcome: Progressing Goal: Dietary intake will improve Outcome: Progressing   Problem: Education: Goal: Ability to describe self-care measures that may prevent or decrease complications (Diabetes Survival Skills Education) will improve Outcome: Progressing Goal: Individualized Educational Video(s) Outcome: Progressing   Problem: Coping: Goal: Ability to adjust to condition or change in health will improve Outcome: Progressing   Problem: Fluid Volume: Goal: Ability to maintain a balanced intake and output will improve Outcome: Progressing   Problem: Health Behavior/Discharge Planning: Goal: Ability to identify and utilize available resources and services will improve Outcome:  Progressing Goal: Ability to manage health-related needs will improve Outcome: Progressing   Problem: Metabolic: Goal: Ability to maintain appropriate glucose levels will improve Outcome: Progressing   Problem: Nutritional: Goal: Maintenance of adequate nutrition will improve Outcome: Progressing Goal: Progress toward achieving an optimal weight will improve Outcome: Progressing   Problem: Skin Integrity: Goal: Risk for impaired skin integrity will decrease Outcome: Progressing   Problem: Tissue Perfusion: Goal: Adequacy of tissue perfusion will improve Outcome: Progressing   Problem: Education: Goal: Knowledge of General Education information will improve Description: Including pain rating scale, medication(s)/side effects and non-pharmacologic comfort measures Outcome: Progressing   Problem: Health Behavior/Discharge Planning: Goal: Ability to manage health-related needs will improve Outcome: Progressing   Problem: Clinical Measurements: Goal: Ability to maintain clinical measurements within normal limits will improve Outcome: Progressing Goal: Will remain free from infection Outcome: Progressing Goal: Diagnostic test results will improve Outcome: Progressing Goal: Respiratory complications will improve Outcome: Progressing Goal: Cardiovascular complication will be avoided Outcome: Progressing   Problem: Activity: Goal: Risk for activity intolerance will decrease Outcome: Progressing   Problem: Nutrition: Goal: Adequate nutrition will be maintained Outcome: Progressing   Problem: Coping: Goal: Level of anxiety will decrease Outcome: Progressing   Problem: Elimination: Goal: Will not experience complications related to bowel motility Outcome: Progressing Goal: Will not experience complications related to urinary retention Outcome: Progressing   Problem: Pain Management: Goal: General experience of comfort will improve Outcome: Progressing   Problem:  Safety: Goal: Ability to remain free from injury will improve Outcome: Progressing   Problem: Skin Integrity: Goal: Risk for impaired skin integrity will decrease Outcome: Progressing

## 2023-09-23 NOTE — Progress Notes (Signed)
  Echocardiogram 2D Echocardiogram has been performed.  Lucendia Herrlich 09/23/2023, 11:49 AM

## 2023-09-23 NOTE — Evaluation (Signed)
Physical Therapy Evaluation Patient Details Name: Alexander [name] MRN: [id number] DOB: 01/31/1951 Today's Date: 09/23/2023  History of Present Illness  69 male with history of hyperlipidemia, possible history of TIA, insulin-dependent diabetes melitis type II, hypertension, history of acute cholecystitis, who presents emergency department for chief concerns of right hand numbness for 2 to 3 weeks. Pssoible watershed infact and positive for L postcentral gyrus infarct.   Clinical Impression  Pt received in bed and agreed to PT session. Pt performed bed mobility SUP, STS with the use of RW (2wheels) CGA, and amb with RW ~19ft CGA. While amb, pt reported feeling good, however noticed continued weakness and stiffness within their RLE. Pt demonstrated increased efforts when lifting foot off of ground. Pt has been reported to present with bilat foot drop when amb. This was not noted during PT session, however very crucial to continue monitoring in future sessions. Pt tolerated Tx well and will continue to benefit from skilled PT sessions to improve strength, functional mobility, and activity tolerance to maximize safety/return to PLOF following D/C.        If plan is discharge home, recommend the following: A little help with walking and/or transfers;Assist for transportation;Help with stairs or ramp for entrance   Can travel by private vehicle        Equipment Recommendations None recommended by PT  Recommendations for Other Services       Functional Status Assessment Patient has had a recent decline in their functional status and demonstrates the ability to make significant improvements in function in a reasonable and predictable amount of time.     Precautions / Restrictions Precautions Precautions: Fall Restrictions Weight Bearing Restrictions: No      Mobility  Bed Mobility Overal bed mobility: Needs Assistance Bed Mobility: Supine to Sit     Supine to sit: Supervision      General bed mobility comments: Pt performed bed mobility SUP and did not report any s/sx relative to dizziness.    Transfers Overall transfer level: Needs assistance Equipment used: Rolling walker (2 wheels) Transfers: Sit to/from Stand Sit to Stand: Supervision           General transfer comment: Pt performed STS with the use of RW (2wheels) and did not report any s/sx relative to dizziness while standing.    Ambulation/Gait Ambulation/Gait assistance: Contact guard assist Gait Distance (Feet): 100 Feet Assistive device: Rolling walker (2 wheels) Gait Pattern/deviations: Step-through pattern Gait velocity: WNL     General Gait Details: Pt amb with the use of RW (2wheels) CGA. Pt demonstrated increased efforts to lift feet off of ground during amb. Pt reported sensing stiffness and weakness in RLE.  Stairs            Wheelchair Mobility     Tilt Bed    Modified Rankin (Stroke Patients Only)       Balance Overall balance assessment: Needs assistance Sitting-balance support: Feet supported Sitting balance-Leahy Scale: Good     Standing balance support: Reliant on assistive device for balance, During functional activity, Bilateral upper extremity supported Standing balance-Leahy Scale: Fair                               Pertinent Vitals/Pain Pain Assessment Pain Assessment: No/denies pain    Home Living Family/patient expects to be discharged to:: Private residence Living Arrangements: Spouse/significant other Available Help at Discharge: Family;Available 24 hours/day Type of Home: House Home Access: Stairs  to enter Entrance Stairs-Rails: Left Entrance Stairs-Number of Steps: 8   Home Layout: Two level Home Equipment: Agricultural consultant (2 wheels);Shower seat      Prior Function Prior Level of Function : Working/employed;Independent/Modified Independent;Driving             Mobility Comments: Pt reports IND prior to admission; with  the use of RW (2wheels). ADLs Comments: Pt reports IND prior to admission.     Extremity/Trunk Assessment   Upper Extremity Assessment Upper Extremity Assessment: Generalized weakness;Right hand dominant;RUE deficits/detail RUE Deficits / Details: numbness reported in all digits.    Lower Extremity Assessment Lower Extremity Assessment: Generalized weakness       Communication   Communication Communication: No apparent difficulties Cueing Techniques: Verbal cues  Cognition Arousal: Alert Behavior During Therapy: WFL for tasks assessed/performed Overall Cognitive Status: Within Functional Limits for tasks assessed                                 General Comments: AOx4. Pt pleasant and willing to participate in PT session.        General Comments      Exercises     Assessment/Plan    PT Assessment Patient needs continued PT services  PT Problem List Decreased strength;Decreased activity tolerance;Decreased mobility       PT Treatment Interventions Gait training;Functional mobility training;Therapeutic exercise;Therapeutic activities;Stair training    PT Goals (Current goals can be found in the Care Plan section)  Acute Rehab PT Goals Patient Stated Goal: No goals stated. PT Goal Formulation: With patient Time For Goal Achievement: 10/07/23 Potential to Achieve Goals: Good    Frequency Min 1X/week     Co-evaluation               AM-PAC PT "6 Clicks" Mobility  Outcome Measure Help needed turning from your back to your side while in a flat bed without using bedrails?: None Help needed moving from lying on your back to sitting on the side of a flat bed without using bedrails?: A Little Help needed moving to and from a bed to a chair (including a wheelchair)?: A Little Help needed standing up from a chair using your arms (e.g., wheelchair or bedside chair)?: A Little Help needed to walk in hospital room?: A Little Help needed climbing 3-5  steps with a railing? : A Little 6 Click Score: 19    End of Session Equipment Utilized During Treatment: Gait belt Activity Tolerance: Patient tolerated treatment well;Patient limited by fatigue Patient left: in chair;with call bell/phone within reach Nurse Communication: Mobility status PT Visit Diagnosis: Unsteadiness on feet (R26.81);Other abnormalities of gait and mobility (R26.89);Muscle weakness (generalized) (M62.81);Difficulty in walking, not elsewhere classified (R26.2)    Time: 7829-5621 PT Time Calculation (min) (ACUTE ONLY): 14 min   Charges:   PT Evaluation $PT Eval Low Complexity: 1 Low   PT General Charges $$ ACUTE PT VISIT: 1 Visit        Rashaun Wichert Sauvignon Howard SPT, LAT, ATC  Jakobie Henslee Sauvignon-Howard 09/23/2023, 1:23 PM

## 2023-09-23 NOTE — ED Notes (Signed)
This RN assisted patient using walker to restroom as standby assist.

## 2023-09-23 NOTE — ED Notes (Signed)
Advised nurse that patient has ready bed 

## 2023-09-23 NOTE — Evaluation (Signed)
Occupational Therapy Evaluation Patient Details Name: Alexander [name] MRN: [id number] DOB: [birth year] Today's Date: 09/23/2023   History of Present Illness 54 male with history of hyperlipidemia, possible history of TIA, insulin-dependent diabetes melitis type II, hypertension, history of acute cholecystitis, who presents emergency department for chief concerns of right hand numbness for 2 to 3 weeks. Pssoible watershed infact and positive for L postcentral gyrus infarct.   Clinical Impression   Patient presenting with decreased Ind in self care,balance, functional mobility/transfers, and safety awareness. Patient reports living at home with wife and being Ind at baseline. 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 Pt reports numbness in R digits and displays decreased coordination. Pt ambulates with RW to bathroom with supervision overall. Patient will benefit from acute OT to increase overall independence in the areas of ADLs, functional mobility, and safety awareness  in order to safely discharge .      If plan is discharge home, recommend the following: A little help with walking and/or transfers;A little help with bathing/dressing/bathroom;Assistance with cooking/housework;Assist for transportation;Help with stairs or ramp for entrance    Functional Status Assessment  Patient has had a recent decline in their functional status and demonstrates the ability to make significant improvements in function in a reasonable and predictable amount of time.  Equipment Recommendations  None recommended by OT       Precautions / Restrictions Precautions Precautions: Fall      Mobility Bed Mobility               General bed mobility comments: seated in recliner chair at beginning and end of session    Transfers Overall transfer level: Needs assistance Equipment used: Rolling walker (2 wheels) Transfers: Sit to/from Stand Sit to Stand: Supervision                   Balance Overall balance assessment: Needs assistance Sitting-balance support: Feet supported Sitting balance-Leahy Scale: Good     Standing balance support: Reliant on assistive device for balance, During functional activity, Bilateral upper extremity supported Standing balance-Leahy Scale: Fair                             ADL either performed or assessed with clinical judgement   ADL Overall ADL's : Needs assistance/impaired                         Toilet Transfer: Supervision/safety;Rolling walker (2 wheels);Regular Toilet                   Vision Patient Visual Report: No change from baseline              Pertinent Vitals/Pain Pain Assessment Pain Assessment: No/denies pain     Extremity/Trunk Assessment Upper Extremity Assessment Upper Extremity Assessment: Generalized weakness;Right hand dominant;RUE deficits/detail RUE Deficits / Details: numbness reported in all digits. RUE Sensation: decreased light touch RUE Coordination: decreased fine motor   Lower Extremity Assessment Lower Extremity Assessment: Defer to PT evaluation       Communication Communication Communication: No apparent difficulties Cueing Techniques: Verbal cues   Cognition Arousal: Alert Behavior During Therapy: WFL for tasks assessed/performed Overall Cognitive Status: Within Functional Limits for tasks assessed                                 General Comments:  pleasant throughout session                Home Living Family/patient expects to be discharged to:: Private residence Living Arrangements: Spouse/significant other Available Help at Discharge: Family;Available 24 hours/day Type of Home: House Home Access: Stairs to enter [employer] of Steps: 8 Entrance Stairs-Rails: Left Home Layout: Two level     Bathroom Shower/Tub: Walk-in shower         Home Equipment: Agricultural consultant (2 wheels);Shower seat           Prior Functioning/Environment Prior Level of Function : Working/employed;Independent/Modified Independent;Driving                        OT Problem List: Decreased strength;Decreased coordination;Decreased activity tolerance;Decreased safety awareness;Impaired balance (sitting and/or standing);Decreased knowledge of use of DME or AE;Impaired UE functional use      OT Treatment/Interventions: Self-care/ADL training;Therapeutic exercise;Therapeutic activities;Energy conservation;DME and/or AE instruction;Patient/family education;Balance training    OT Goals(Current goals can be found in the care plan section) Acute Rehab OT Goals Patient Stated Goal: to go home and work on R UE OT Goal Formulation: With patient Time For Goal Achievement: 10/06/23 Potential to Achieve Goals: Fair  OT Frequency: Min 1X/week       AM-PAC OT "6 Clicks" Daily Activity     Outcome Measure Help from another person eating meals?: None Help from another person taking care of personal grooming?: A Little Help from another person toileting, which includes using toliet, bedpan, or urinal?: A Little Help from another person bathing (including washing, rinsing, drying)?: A Little Help from another person to put on and taking off regular upper body clothing?: A Little Help from another person to put on and taking off regular lower body clothing?: A Little 6 Click Score: 19   End of Session Equipment Utilized During Treatment: Rolling walker (2 wheels)  Activity Tolerance: Patient tolerated treatment well Patient left: in bed;with call bell/phone within reach;with bed alarm set  OT Visit Diagnosis: Unsteadiness on feet (R26.81);Repeated falls (R29.6);Muscle weakness (generalized) (M62.81)                Time: 2130-8657 OT Time Calculation (min): 21 min Charges:  OT General Charges $OT Visit: 1 Visit OT Evaluation $OT Eval Low Complexity: 1 Low OT Treatments $Self Care/Home Management : 8-22  mins  Jackquline Denmark, MS, OTR/L , CBIS ascom [phone]  09/23/23, 10:40 AM

## 2023-09-23 NOTE — ED Notes (Signed)
SLP assessing patient at bedside.

## 2023-09-23 NOTE — Consult Note (Signed)
NEURO HOSPITALIST CONSULT NOTE   Requesting physician: Dr. Fran Lowes  Reason for Consult: Scattered foci of acute/subacute infarction over the left frontal and parietal convexity on MRI  History obtained from:  Patient, Family and Chart     HPI:                                                                                                                                          Alexander [name] is an 72 y.o. right handed male with a PMHx of DM, polyneuropathy, bilateral foot drop, HTN, CAD s/p CABG x 5, ischemic cardiomyopathy, CHF, chronic lower back pain, elevated LFTs, frequent falls, history of concussion, nephrolithiasis, cataract OD s/p surgery, diabetic retinopathy OU s/p laser surgery OD and prior stroke with symptoms of right hand paresthesias who presented to the ED yesterday with new onset of right hand tingling. Symptoms started 3 weeks ago while he was sitting on the couch and felt his right leg become numb. When he tried to get up and walk, his RLE gave way, causing him to fall onto his right side, without striking his head or losing consciousness. Since then, he has had intermittent tingling with numbness sensation to the right hand and weaker grip strength. He feels that it affects his thumb through third digit more to the fourth and fifth digits. He has had two more falls since then, both mechanical without significant injuries.   MRI in the ED revealed scattered foci of acute/subacute infarction over the left frontal and parietal convexity, thought possibly to represent left-sided emboli or a watershed infarct.    Home medications include atorvastatin and ASA.   Past Medical History:  Diagnosis Date   Acute cholecystitis    Anginal pain (HCC)    CHF (congestive heart failure) (HCC)    Chronic lower back pain    Coronary artery disease    DM (diabetes mellitus), type 2 (HCC)    Elevated LFTs    Facial cellulitis    Frequent falls    GERD (gastroesophageal  reflux disease)    History of concussion    History of kidney stones    Hypertension    Ischemic cardiomyopathy    Metabolic acidosis    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)    Pneumonia 07/05/2023   admitted to [facility]   Polyneuropathy    S/P CABG x 5 2017   Sepsis (HCC) 07/05/2023   Severe nonproliferative diabetic retinopathy of left eye (HCC)    Stroke (HCC) 2014   mini, tingling right hand    Past Surgical History:  Procedure Laterality Date   CARDIAC CATHETERIZATION Left 08/09/2016   Procedure: Left Heart Cath and Coronary Angiography;  Surgeon: Dalia Heading, MD;  Location: [facility] INVASIVE CV [facility];  Service:  Cardiovascular;  Laterality: Left;   CATARACT EXTRACTION W/PHACO Right 02/06/2023   Procedure: CATARACT EXTRACTION PHACO AND INTRAOCULAR LENS PLACEMENT (IOC) AHMED TUBE SHUNT W/ TUTOPLAST RIGHT DIABETIC  3.94   00:42.6;  Surgeon: Nevada Crane, MD;  Location: [facility];  Service: Ophthalmology;  Laterality: Right;   COLONOSCOPY WITH PROPOFOL N/A 02/02/2021   Procedure: COLONOSCOPY WITH PROPOFOL;  Surgeon: Regis Bill, MD;  Location: [facility] ENDOSCOPY;  Service: Endoscopy;  Laterality: N/A;   CORONARY ARTERY BYPASS GRAFT  2017   x5   IR EXCHANGE BILIARY DRAIN  09/01/2023   IR PERC CHOLECYSTOSTOMY  07/28/2023    Family History  Problem Relation Age of Onset   Hypertension Mother    Diabetes Mother              Social History:  reports that he has never smoked. He has never been exposed to tobacco smoke. He has never used smokeless tobacco. He reports that he does not drink alcohol and does not use drugs.  No Known Allergies  MEDICATIONS:                                                                                                                     No current facility-administered medications on file prior to encounter.   Current Outpatient Medications on File Prior to Encounter  Medication Sig Dispense Refill    acetaminophen (TYLENOL) 500 MG tablet Take 1,000 mg by mouth every 6 (six) hours as needed for mild pain or headache.     atorvastatin (LIPITOR) 80 MG tablet Take 1 tablet (80 mg total) by mouth daily. Hold until you see your doctor and have your liver enzymes repeated     carvedilol (COREG) 3.125 MG tablet Take 3.125 mg by mouth 2 (two) times daily with a meal.     empagliflozin (JARDIANCE) 25 MG TABS tablet Take 25 mg by mouth daily.     LANTUS SOLOSTAR 100 UNIT/ML Solostar Pen Inject 38 Units into the skin at bedtime.     OZEMPIC, 2 MG/DOSE, 8 MG/3ML SOPN Inject 2 mg into the skin every 7 (seven) days. Sundays     sodium chloride flush (NS) 0.9 % SOLN 5 mLs by Intracatheter route daily. 150 mL 1   aspirin EC 81 MG tablet Take 81 mg by mouth daily. (Patient not taking: Reported on 09/22/2023)     feeding supplement (ENSURE ENLIVE / ENSURE PLUS) LIQD Take 237 mLs by mouth 2 (two) times daily between meals. 237 mL 12   sacubitril-valsartan (ENTRESTO) 24-26 MG Take 1 tablet by mouth every 12 (twelve) hours. (Patient not taking: Reported on 09/15/2023)      Scheduled:   stroke: early stages of recovery book   Does not apply Once   aspirin EC  81 mg Oral Daily   atorvastatin  80 mg Oral QHS   carvedilol  1.56 mg Oral BID WC   clopidogrel  75 mg Oral Daily   insulin aspart  0-15 Units Subcutaneous TID  WC   insulin aspart  0-5 Units Subcutaneous QHS   insulin glargine-yfgn  20 Units Subcutaneous QHS   sodium bicarbonate  650 mg Oral BID   Continuous:   ROS:                                                                                                                                       As per HPI. Does not endorse any additional symptoms at the time of neurology evaluation.    Blood pressure 131/78, pulse 92, temperature (!) 97.4 F (36.3 C), temperature source Oral, resp. rate 13, height 6' (1.829 m), weight 101.6 kg, SpO2 99%.   General Examination:                                                                                                        Physical Exam HEENT- [place]/AT   Lungs- Respirations unlabored Extremities- Warm and well-perfused  Neurological Examination Mental Status: Awake and alert. Fully oriented. Thought content appropriate.  Speech fluent without evidence of aphasia.  Able to follow all commands without difficulty. Cranial Nerves: II: Right pupil 4 mm, round and reactive. Left pupil 2 mm round and reactive.  Central visual acuity OD is diminished. Visual fields OD with left inferior quadrant vision loss and crescentic peripheral visual loss temporally.  Visual acuity in left eye is decreased without glasses (patient states he uses readers). Visual fields intact in all 4 quadrants OS.  III,IV, VI: No ptosis. EOMI. No nystagmus. V: Temp sensation equal bilaterally VII: Mild right lower quadrant weakness. Brow furrowing is normal bilaterally.  VIII: Hearing intact to voice IX,X: No hypophonia or hoarseness XI: Symmetric XII: Midline tongue extension Motor: RUE: 5/5 proximally with 4-/5 wrist flexion, wrist extension, finger abduction and finger extension. Grip is 4+/5. Decreased coordination with alternating finger-to-thumb tapping.  LUE: 5/5 proximally and distally except for 4+/5 grip. Normal coordination with alternating finger-to-thumb tapping.  RLE: 5/5 except for foot drop LLE: 5/5 except for foot drop Sensory: Temp and FT intact x 4. No extinction to DSS. Deep Tendon Reflexes: 2+ and symmetric bilateral biceps and brachioradialis. Right patellar 2+, left patellar 0.  Cerebellar: Dysmetria is present with FNF bilaterally, worse on the right Gait: Deferred due to falls risk concerns. Had difficulty sitting up in bed from a supine position.     Lab Results: Basic Metabolic Panel: Recent Labs  Lab 09/22/23 1558  NA 134*  K 4.1  CL 103  CO2 18*  GLUCOSE 125*  BUN 23  CREATININE 0.58*  CALCIUM 8.4*    CBC: Recent Labs  Lab  09/22/23 1558  WBC 11.2*  NEUTROABS 8.1*  HGB 13.0  HCT 40.2  MCV 91.0  PLT 323    Cardiac Enzymes: No results for input(s): "CKTOTAL", "CKMB", "CKMBINDEX", "TROPONINI" in the last 168 hours.  Lipid Panel: Recent Labs  Lab 09/23/23 0315  CHOL 95  TRIG 81  HDL 25*  CHOLHDL 3.8  VLDL 16  LDLCALC 54    Imaging: CT ANGIO HEAD NECK W WO CM  Result Date: 09/22/2023 CLINICAL DATA:  Follow-up examination for stroke. EXAM: CT ANGIOGRAPHY HEAD AND NECK WITH AND WITHOUT CONTRAST TECHNIQUE: Multidetector CT imaging of the head and neck was performed using the standard protocol during bolus administration of intravenous contrast. Multiplanar CT image reconstructions and MIPs were obtained to evaluate the vascular anatomy. Carotid stenosis measurements (when applicable) are obtained utilizing NASCET criteria, using the distal internal carotid diameter as the denominator. RADIATION DOSE REDUCTION: This exam was performed according to the departmental dose-optimization program which includes automated exposure control, adjustment of the mA and/or kV according to patient size and/or use of iterative reconstruction technique. CONTRAST:  75mL OMNIPAQUE IOHEXOL 350 MG/ML SOLN COMPARISON:  MRI from earlier the same day. FINDINGS: CT HEAD FINDINGS Brain: Cerebral volume within normal limits. Mild chronic microvascular ischemic disease. Previously identified left cerebral infarcts grossly stable, better characterized on prior MRI. No associated hemorrhage or mass effect. No other acute large vessel territory infarct. No mass lesion or midline shift. No hydrocephalus or extra-axial fluid collection. Vascular: No abnormal hyperdense vessel. Skull: Scalp soft tissues demonstrate no acute finding. Calvarium intact. Sinuses/Orbits: Globes and orbital soft tissues within normal limits. Visualized paranasal sinuses and mastoid air cells are clear. Other: None. Review of the MIP images confirms the above findings CTA  NECK FINDINGS Aortic arch: Visualized aortic arch within normal limits for caliber with standard 3 vessel morphology. Mild aortic atherosclerosis. No stenosis about the origin the great vessels. Right carotid system: Right common and internal carotid arteries are patent without dissection. Bulky calcified plaque about the right carotid bulb with associated stenosis of up to 60% by NASCET criteria. Left carotid system: Left common and internal carotid arteries are patent without dissection. Calcified plaque about the left carotid bulb with associated stenosis of up to 60% by NASCET criteria. Vertebral arteries: Both vertebral arteries arise from subclavian arteries. No proximal subclavian artery stenosis. Atheromatous change at the origin of the left vertebral artery with mild stenosis. Left vertebral artery dominant, with a diffusely hypoplastic right vertebral artery. Vertebral arteries otherwise patent without stenosis or dissection. Skeleton: No discrete or worrisome osseous lesions. Mild to moderate spondylosis noted at C5-6 and C6-7. Prior sternotomy noted. Other neck: No other acute finding. Upper chest: Linear subsegmental atelectatic changes noted within the left lung. No other acute finding. Review of the MIP images confirms the above findings CTA HEAD FINDINGS Anterior circulation: Extensive atheromatous plaque throughout the carotid siphons with secondary moderate diffuse narrowing bilaterally. A1 segments patent bilaterally. Normal anterior communicating artery complex. Anterior cerebral arteries patent without significant stenosis. No M1 stenosis or occlusion. No proximal MCA branch occlusion or high-grade stenosis. Distal MCA branches perfused and symmetric. Posterior circulation: Both V4 segments patent without significant stenosis. Both PICA patent. Basilar patent without stenosis. Superior cerebellar arteries patent bilaterally. Both PCAs primarily supplied via the basilar. Short-segment moderate  right P2 stenosis (series 11, image 20). Both PCAs otherwise patent to their distal  aspects without significant stenosis. Venous sinuses: Patent allowing for timing the contrast bolus. Anatomic variants: As above.  No aneurysm. Review of the MIP images confirms the above findings IMPRESSION: CT HEAD: 1. Grossly stable left cerebral infarcts, better characterized on prior MRI. No associated hemorrhage or mass effect. 2. No other acute intracranial abnormality. CTA HEAD AND NECK: 1. Negative CTA for large vessel occlusion or other emergent finding. 2. Bulky calcified plaque about the carotid bulbs with associated stenosis of up to 60% bilaterally. 3. Extensive atheromatous plaque throughout the carotid siphons with secondary moderate diffuse narrowing bilaterally. 4. Short-segment moderate right P2 stenosis. 5.  Aortic Atherosclerosis (ICD10-I70.0). Electronically Signed   By: Rise Mu M.D.   On: 09/22/2023 20:35   MR BRAIN WO CONTRAST  Result Date: 09/22/2023 CLINICAL DATA:  Right hand numbness over the last 2-3 weeks. Neuro deficit, acute, stroke suspected. EXAM: MRI HEAD WITHOUT CONTRAST TECHNIQUE: Multiplanar, multiecho pulse sequences of the brain and surrounding structures were obtained without intravenous contrast. COMPARISON:  CT head without contrast 01/15/2019. FINDINGS: Brain: Scattered foci of restricted diffusion are present over the left frontal and parietal convexity. Largest in scratched at the large cortical infarct involves the postcentral gyrus, likely accounting for the right hand numbness. Whether cortical and subcortical foci are present. T2 and FLAIR hyperintensities are associated with the areas of acute/subacute infarction. Remote lacunar infarcts are present within the centrum semiovale bilaterally. Focal petechial hemorrhage is associated with the infarct of the left postcentral gyrus. Remote lacunar infarcts of the left thalamus are again noted. White matter changes extend  into the brainstem. Vascular: Flow is present in the major intracranial arteries. Skull and upper cervical spine: The craniocervical junction is normal. Upper cervical spine is within normal limits. Marrow signal is unremarkable. Sinuses/Orbits: The paranasal sinuses and mastoid air cells are clear. Right lens replacement is present. Globes and orbits are otherwise within normal limits. IMPRESSION: 1. Scattered foci of acute/subacute infarction over the left frontal and parietal convexity. This may represent left-sided emboli or a watershed infarct. 2. The largest infarct involves the left postcentral gyrus, likely accounting for the right hand numbness. 3. Focal petechial hemorrhage associated with the infarct of the left postcentral gyrus. 4. Remote lacunar infarcts of the centrum semiovale bilaterally and left thalamus. 5. Underlying small vessel ischemic disease. These results were called by telephone at the time of interpretation on 09/22/2023 at 5:18 pm to provider [facility] , who verbally acknowledged these results. Electronically Signed   By: Marin Roberts M.D.   On: 09/22/2023 17:18     Assessment: 72 year old right handed male with a PMHx of DM, polyneuropathy, bilateral foot drop, HTN, CAD s/p CABG x 5, ischemic cardiomyopathy, CHF, chronic lower back pain, elevated LFTs, frequent falls, history of concussion, nephrolithiasis, cataract OD s/p surgery, diabetic retinopathy OU s/p laser surgery OD and prior stroke with symptoms of right hand paresthesias who presented to the ED yesterday with new onset of right hand tingling. Symptoms started 3 weeks ago while he was sitting on the couch and felt his right leg become numb. When he tried to get up and walk, his RLE gave way, causing him to fall onto his right side, without striking his head or losing consciousness. Since then, he has had intermittent tingling with numbness sensation to the right hand and weaker grip strength. MRI in the ED  revealed scattered foci of acute/subacute infarction over the left frontal and parietal convexity, thought possibly to represent left-sided emboli or  a watershed infarct.  - Exam reveals subtle right UMN findings referable to the strokes seen on MRI.  - CTA of head and neck: Bulky calcified plaque about the carotid bulbs with associated stenosis of up to 60% bilaterally. Extensive atheromatous plaque throughout the carotid siphons with secondary moderate diffuse narrowing bilaterally. Short-segment moderate right P2 stenosis. Aortic Atherosclerosis. No LVO.  - MRI brain: Scattered foci of acute/subacute infarction over the left frontal and parietal convexity. This may represent left-sided emboli or a watershed infarct. The largest infarct involves the left postcentral gyrus, likely accounting for the right hand numbness. There is focal petechial hemorrhage associated with the infarct of the left postcentral gyrus. Also noted are remote lacunar infarcts of the centrum semiovale bilaterally and left thalamus. Underlying small vessel ischemic disease. - EKG: Normal sinus rhythm; Prolonged QT; When compared with ECG of 05-Jul-2023 09:28, QRS axis now shifted right - TTE completed with report pending. - Stroke risk factors: DM, HTN, CAD, CHF and prior stroke - Overall impression: Acute left MCA strokes in a patchy distribution. DDx for underlying etiology includes cardioembolic versus atherothrombotic from left ICA plaque with distal embolization.    Recommendations: - BP management per standard protocol, as he is out of the permissive HTN time window.  - Add Plavix to ASA - Continue atorvastatin. Obtain baseline CK level.  - Carotid ultrasound and Vascular Surgery consult for consideration of left CEA (his symptomatic side, with up to 60% stenosis of the left ICA in association with bulky calcified plaque).  - HgbA1c, fasting lipid panel - PT consult, OT consult, Speech consult - Risk factor  modification - Telemetry monitoring to assess for possible intermittent a-fib.  - Frequent neuro checks - NPO until passes stroke swallow screen - Glycemic control  Electronically signed: Dr. Caryl Pina 09/23/2023, 8:21 AM

## 2023-09-23 NOTE — Progress Notes (Signed)
PHARMACIST - PHYSICIAN COMMUNICATION  CONCERNING:  Enoxaparin (Lovenox) for DVT Prophylaxis    RECOMMENDATION: Patient was prescribed enoxaprin 40mg  q24 hours for VTE prophylaxis.   Filed Weights   09/22/23 1433 09/22/23 1839  Weight: 101.6 kg (224 lb) 101.6 kg (223 lb 15.8 oz)    Body mass index is 30.38 kg/m.  Estimated Creatinine Clearance: 104.5 mL/min (A) (by C-G formula based on SCr of 0.58 mg/dL (L)).   Based on [facility] policy patient is candidate for enoxaparin 0.5mg /kg TBW SQ every 24 hours based on BMI being >30.  DESCRIPTION: Pharmacy has adjusted enoxaparin dose per [facility] policy.  Patient is now receiving enoxaparin 50 mg every 24 hours    Merryl Hacker, PharmD Clinical Pharmacist  09/23/2023 7:38 PM

## 2023-09-24 DIAGNOSIS — R2990 Unspecified symptoms and signs involving the nervous system: Secondary | ICD-10-CM | POA: Diagnosis not present

## 2023-09-24 LAB — ECHOCARDIOGRAM COMPLETE BUBBLE STUDY
AR max vel: 3.84 cm2
AV Peak grad: 3.4 mm[Hg]
Ao pk vel: 0.93 m/s
Area-P 1/2: 4.86 cm2
MV M vel: 3.24 m/s
MV Peak grad: 41.9 mm[Hg]
S' Lateral: 4.5 cm

## 2023-09-24 LAB — BASIC METABOLIC PANEL
Anion gap: 11 (ref 5–15)
BUN: 20 mg/dL (ref 8–23)
CO2: 22 mmol/L (ref 22–32)
Calcium: 8.9 mg/dL (ref 8.9–10.3)
Chloride: 105 mmol/L (ref 98–111)
Creatinine, Ser: 0.49 mg/dL — ABNORMAL LOW (ref 0.61–1.24)
GFR, Estimated: >60 mL/min (ref >60)
Glucose, Bld: 167 mg/dL — ABNORMAL HIGH (ref 70–99)
Potassium: 3.6 mmol/L (ref 3.5–5.1)
Sodium: 138 mmol/L (ref 135–145)

## 2023-09-24 LAB — GLUCOSE, CAPILLARY
Glucose-Capillary: 201 mg/dL — ABNORMAL HIGH (ref 70–99)
Glucose-Capillary: 118 mg/dL — ABNORMAL HIGH (ref 70–99)
Glucose-Capillary: 171 mg/dL — ABNORMAL HIGH (ref 70–99)
Glucose-Capillary: 204 mg/dL — ABNORMAL HIGH (ref 70–99)

## 2023-09-24 LAB — MAGNESIUM: Magnesium: 2.1 mg/dL (ref 1.7–2.4)

## 2023-09-24 NOTE — Progress Notes (Signed)
Mobility Specialist - Progress Note      09/24/23 1601  Mobility  Activity Ambulated with assistance in hallway  Level of Assistance Independent  Assistive Device Front wheel walker  Distance Ambulated (ft) 120 ft  Range of Motion/Exercises Active  Activity Response Tolerated well  Mobility Referral Yes  $Mobility charge 1 Mobility   Pt resting in recliner on RA upon entry. Pt agreeable to participate in ambulation. Pt STS and ambulates to hallway around NS Indep with RW. Pt endorses pain in right calf and tube site (6/10) in standing. Pt has slight limping (antalgic) gait but no foot drop observed. Pt returned to bed and left in recliner with needs in reach.   Johnathan Hausen Mobility Specialist 09/24/23, 4:07 PM

## 2023-09-24 NOTE — Consult Note (Signed)
[facility] [place]  Patient name: Alexander [name] MRN: [id number] DOB: 1950-11-03 Sex: male   REQUESTING PROVIDER:    Dr. Lyn Hollingshead   REASON FOR CONSULT:    CVA   HISTORY OF PRESENT ILLNESS:   Alexander [name] is a 72 y.o. male, who presented to the emergency department on 09/22/2023 with right hand numbness.  The patient states that about 2 weeks ago he fell for the first time.  This was associated with right leg issues.  He has had several other falls since that time.  He denies any other neurologic symptoms such as trouble with slurred speech or amaurosis fugax.  Patient has a history of coronary artery disease, status post CABG in 2017 he has also been diagnosed with congestive heart failure with ejection fraction of 20-25%.  He takes a statin for hypercholesterolemia.  He is a diabetic he is a non-smoker.  He has a cholecystostomy tube in place which has been there since September.  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)    Pneumonia 07/05/2023   admitted to [facility]   Polyneuropathy    S/P CABG x 5 2017   Sepsis (HCC) 07/05/2023   Severe nonproliferative diabetic retinopathy of left eye (HCC)    Stroke (HCC) 2014   mini, tingling right hand     FAMILY HISTORY   Family History  Problem Relation Age of Onset   Hypertension Mother    Diabetes Mother     SOCIAL HISTORY:   Social  History   Socioeconomic History   Marital status: Married    Spouse name: Not on file   Number of children: Not on file   Years of education: Not on file   Highest education level: Not on file  Occupational History   Not on file  Tobacco Use   Smoking status: Never    Passive exposure: Never   Smokeless tobacco: Never  Vaping Use   Vaping status: Never Used  Substance and Sexual Activity   Alcohol use: No   Drug use: Never   Sexual activity: Yes    Partners: Female  Other Topics Concern   Not on file  Social History Narrative   Not on file   Social Determinants of Health   Financial Resource Strain: Not on file  Food Insecurity: No Food Insecurity (09/24/2023)   Hunger Vital Sign    Worried About Running Out of Food in the Last Year: Never true    Ran Out of Food in the Last Year: Never true  Transportation Needs: No Transportation Needs (09/24/2023)   PRAPARE - Administrator, Civil Service (Medical): No    Lack of Transportation (Non-Medical): No  Physical Activity: Not on file  Stress: Not on file  Social Connections: Not on file  Intimate Partner  Violence: Not At Risk (09/24/2023)   Humiliation, Afraid, Rape, and Kick questionnaire    Fear of Current or Ex-Partner: No    Emotionally Abused: No    Physically Abused: No    Sexually Abused: No    ALLERGIES:    No Known Allergies  CURRENT MEDICATIONS:    Current Facility-Administered Medications  Medication Dose Route Frequency Provider Last Rate Last Admin    stroke: early stages of recovery book   Does not apply Once Cox, Amy N, DO       acetaminophen (TYLENOL) tablet 650 mg  650 mg Oral Q4H PRN Cox, Amy N, DO       Or   acetaminophen (TYLENOL) 160 MG/5ML solution 650 mg  650 mg Per Tube Q4H PRN Cox, Amy N, DO       Or   acetaminophen (TYLENOL) suppository 650 mg  650 mg Rectal Q4H PRN Cox, Amy N, DO       aspirin EC tablet 81 mg  81 mg Oral Daily Cox, Amy N, DO   81 mg at 09/24/23 1610    atorvastatin (LIPITOR) tablet 80 mg  80 mg Oral QHS Cox, Amy N, DO   80 mg at 09/23/23 2201   carvedilol (COREG) tablet 1.56 mg  1.56 mg Oral BID WC Cox, Amy N, DO   1.56 mg at 09/24/23 9604   clopidogrel (PLAVIX) tablet 75 mg  75 mg Oral Daily Cox, Amy N, DO   75 mg at 09/24/23 0929   enoxaparin (LOVENOX) injection 50 mg  0.5 mg/kg Subcutaneous Q24H Hunt, Madison H, RPH   50 mg at 09/23/23 2200   hydrALAZINE (APRESOLINE) injection 5 mg  5 mg Intravenous Q6H PRN Cox, Amy N, DO       insulin aspart (novoLOG) injection 0-15 Units  0-15 Units Subcutaneous TID WC Cox, Amy N, DO   5 Units at 09/24/23 5409   insulin aspart (novoLOG) injection 0-5 Units  0-5 Units Subcutaneous QHS Cox, Amy N, DO       insulin glargine-yfgn (SEMGLEE) injection 20 Units  20 Units Subcutaneous QHS Cox, Amy N, DO   20 Units at 09/23/23 2313   senna-docusate (Senokot-S) tablet 1 tablet  1 tablet Oral QHS PRN Cox, Amy N, DO        REVIEW OF SYSTEMS:   [X]  denotes positive finding, [ ]  denotes negative finding Cardiac  Comments:  Chest pain or chest pressure:    Shortness of breath upon exertion:    Short of breath when lying flat:    Irregular heart rhythm:        Vascular    Pain in calf, thigh, or hip brought on by ambulation:    Pain in feet at night that wakes you up from your sleep:     Blood clot in your veins:    Leg swelling:         Pulmonary    Oxygen at home:    Productive cough:     Wheezing:         Neurologic    Sudden weakness in arms or legs:  x   Sudden numbness in arms or legs:  x   Sudden onset of difficulty speaking or slurred speech:    Temporary loss of vision in one eye:     Problems with dizziness:         Gastrointestinal    Blood in stool:      Vomited blood:  Genitourinary    Burning when urinating:     Blood in urine:        Psychiatric    Major depression:         Hematologic    Bleeding problems:    Problems with blood clotting too easily:        Skin     Rashes or ulcers:        Constitutional    Fever or chills:     PHYSICAL EXAM:   Vitals:   09/23/23 2023 09/24/23 0200 09/24/23 0420 09/24/23 0853  BP: 118/68 111/73 113/70 117/70  Pulse: 97 85 89 94  Resp: 20 20 17 18   Temp: 98.7 F (37.1 C) 98.7 F (37.1 C) 98.7 F (37.1 C) 98.2 F (36.8 C)  TempSrc: Oral Oral Oral   SpO2: 99% 97% 99% 92%  Weight: 101 kg     Height: 6' (1.829 m)       GENERAL: The patient is a well-nourished male, in no acute distress. The vital signs are documented above. CARDIAC: There is a regular rate and rhythm.  PULMONARY: Nonlabored respirations ABDOMEN: Soft and non-tender  MUSCULOSKELETAL: There are no major deformities or cyanosis. NEUROLOGIC: Right hand and leg weakness SKIN: There are no ulcers or rashes noted. PSYCHIATRIC: The patient has a normal affect.  STUDIES:   I have reviewed the following :  CTA: CT HEAD:   1. Grossly stable left cerebral infarcts, better characterized on prior MRI. No associated hemorrhage or mass effect. 2. No other acute intracranial abnormality.   CTA HEAD AND NECK:   1. Negative CTA for large vessel occlusion or other emergent finding. 2. Bulky calcified plaque about the carotid bulbs with associated stenosis of up to 60% bilaterally. 3. Extensive atheromatous plaque throughout the carotid siphons with secondary moderate diffuse narrowing bilaterally. 4. Short-segment moderate right P2 stenosis. 5.  Aortic Atherosclerosis (ICD10-I70.0).   MRI Brain: 1. Scattered foci of acute/subacute infarction over the left frontal and parietal convexity. This may represent left-sided emboli or a watershed infarct. 2. The largest infarct involves the left postcentral gyrus, likely accounting for the right hand numbness. 3. Focal petechial hemorrhage associated with the infarct of the left postcentral gyrus. 4. Remote lacunar infarcts of the centrum semiovale bilaterally and left thalamus. 5. Underlying  small vessel ischemic disease.  ASSESSMENT and PLAN   Symptomatic left carotid stenosis: The patient has MRI evidence of acute left-sided infarcts and CT angiography shows approximately 60% bilateral carotid stenosis with disease throughout the carotid siphons and intracranial vessels.  We discussed treating his carotid stenosis to minimize the risk of future events.  Because he wants to have his gallbladder removed in the near future, I would favor carotid endarterectomy so that he would not be required to be on dual antiplatelet therapy.  He does have cardiac issues and so general anesthesia may be a concern.  I will have cardiology weigh in on this.  Vascular surgery will follow-up tomorrow to determine timing and modality for carotid intervention either stenting or endarterectomy.   Charlena Cross, MD, FACS [facility] [phone] Pager [phone]

## 2023-09-24 NOTE — Plan of Care (Signed)
Vascular Surgery is considering CEA prior to the patient's gallbladder surgery to limit the need for DAPT during the abdominal surgery. Vascular Surgery has requested Cardiology consult due to the patient's cardiac issues, which may present a concern regarding general anesthesia.   Continue DAPT for now.   Stroke work up complete except for continued cardiac telemetry.   Neurohospitalist service will follow PRN. Please call if there are additional questions.   Electronically signed: Dr. Caryl Pina

## 2023-09-24 NOTE — Progress Notes (Signed)
PROGRESS NOTE    Alexander [name]   GNF:[id number] DOB: 1951-09-12  DOA: 09/22/2023 Date of Service: 09/24/23 which is hospital day 0  PCP: Myrene Buddy, NP    HPI: [name] is a 89 male with history of hyperlipidemia, possible history of TIA, insulin-dependent diabetes melitis type II, hypertension, history of acute cholecystitis, who presents emergency department for chief concerns of right hand numbness for 2 to 3 weeks. It has gradually worsened, prompting him to present to the emergency department for further evaluation as he is supposed to have cholecystectomy next week. He wanted to figure this out before the surgery.   Hospital course / significant events:  11/29: admitted to hospitalist service for concern for CVA 11/30: (+)Carotid disease on CTA 12/01: Vascular consult - possible for carotid endarterectomy, will discuss tomorrow (today is Sunday). Echo also showing worsening EF< will likely need cardiology clearance prior to anesthesia, will reach out to cardiology team tomorrow as well as surgical team re: upcoming cholecystectomy   Consultants:  Neurology Vascular surgery   Procedures/Surgeries: none      ASSESSMENT & PLAN:    Stroke-like symptom Left frontal and parietal convexity with scattered foci, concerning for left-sided emboli or watershed infarct. Largest infarct involving the left postcentral gyrus, can account for the right hand numbness.  Echo -->  Neurology recs for carotid ultrasound and vascular surgery consult to assess risks/benefits of left CEA Otherwise, his stroke work up from the neurology end is complete.  Plavix 75 mg daily, aspirin 81 mg daily.     Carotid artery stenosis Vascular consult - possible for carotid endarterectomy, will discuss tomorrow (today is Sunday).   Chronic HFrEF Echo showing worsening EF will likely need cardiology clearance prior to anesthesia, will reach out to cardiology team tomorrow as well as  surgical team re: upcoming cholecystectomy   Acute cholecystitis Drain is in place continue outpatient follow-up with general surgery will likely need cardiology clearance prior to anesthesia, will reach out to cardiology team tomorrow as well as surgical team re: upcoming cholecystectomy    Metabolic acidosis - resolved Sodium bicarbonate 650 mg p.o. twice daily, 2 doses ordered on admission Monitor BMP outpatient    Hypertension Home Coreg 1.6 mg p.o. twice daily with meals resumed   Insulin dependent type 2 diabetes mellitus (HCC) A1c was 6.7 on 07/29/23 Insulin SSI with at bedtime coverage ordered   Leukocytosis Mild leukocytosis, suspect reactive in setting of stroke Monitor periodic CBC   Hyperlipidemia Atorvastatin 80 mg daily   obese based on BMI: Body mass index is 30.2 kg/m.  Underweight - under 18.5  normal weight - 18.5 to 24.9 overweight - 25 to 29.9 obese - 30 or more   DVT prophylaxis: lovenox  IV fluids: no continuous IV fluids  Nutrition: cardiac diet, will make NPO at midnight in case possibble procedure tomorrow  Central lines / invasive devices: biliary drain  Code Status: FULL CODE ACP documentation reviewed:  none on file in [facility]  TOC needs: TBD Barriers to dispo / significant pending items: vascular / cardiology workup as above, anticipate may be here couple more days              Subjective / Brief ROS:  Patient reports doing well this morning  Denies CP/SOB.  Pain controlled.  Denies new weakness.  Tolerating diet.  Reports no concerns w/ urination/defecation.   Family Communication: none at this time     Objective Findings:  Vitals:   09/23/23  2023 09/24/23 0200 09/24/23 0420 09/24/23 0853  BP: 118/68 111/73 113/70 117/70  Pulse: 97 85 89 94  Resp: 20 20 17 18   Temp: 98.7 F (37.1 C) 98.7 F (37.1 C) 98.7 F (37.1 C) 98.2 F (36.8 C)  TempSrc: Oral Oral Oral   SpO2: 99% 97% 99% 92%  Weight: 101 kg     Height:  6' (1.829 m)       Intake/Output Summary (Last 24 hours) at 09/24/2023 1635 Last data filed at 09/23/2023 2000 Gross per 24 hour  Intake 400 ml  Output --  Net 400 ml   Filed Weights   09/22/23 1433 09/22/23 1839 09/23/23 2023  Weight: 101.6 kg 101.6 kg 101 kg    Examination:  Physical Exam Constitutional:      General: He is not in acute distress.    Appearance: Normal appearance.  Cardiovascular:     Rate and Rhythm: Normal rate and regular rhythm.  Pulmonary:     Effort: Pulmonary effort is normal.     Breath sounds: Normal breath sounds.  Musculoskeletal:     Right lower leg: No edema.     Left lower leg: No edema.  Skin:    General: Skin is warm and dry.  Neurological:     Mental Status: He is alert and oriented to person, place, and time.  Psychiatric:        Mood and Affect: Mood normal.        Behavior: Behavior normal.          Scheduled Medications:    stroke: early stages of recovery book   Does not apply Once   aspirin EC  81 mg Oral Daily   atorvastatin  80 mg Oral QHS   carvedilol  1.56 mg Oral BID WC   clopidogrel  75 mg Oral Daily   enoxaparin (LOVENOX) injection  0.5 mg/kg Subcutaneous Q24H   insulin aspart  0-15 Units Subcutaneous TID WC   insulin aspart  0-5 Units Subcutaneous QHS   insulin glargine-yfgn  20 Units Subcutaneous QHS    Continuous Infusions:   PRN Medications:  acetaminophen **OR** acetaminophen (TYLENOL) oral liquid 160 mg/5 mL **OR** acetaminophen, hydrALAZINE, senna-docusate  Antimicrobials from admission:  Anti-infectives (From admission, onward)    None           Data Reviewed:  I have personally reviewed the following...  CBC: Recent Labs  Lab 09/22/23 1558  WBC 11.2*  NEUTROABS 8.1*  HGB 13.0  HCT 40.2  MCV 91.0  PLT 323   Basic Metabolic Panel: Recent Labs  Lab 09/22/23 1558 09/24/23 0407  NA 134* 138  K 4.1 3.6  CL 103 105  CO2 18* 22  GLUCOSE 125* 167*  BUN 23 20  CREATININE  0.58* 0.49*  CALCIUM 8.4* 8.9  MG  --  2.1   GFR: Estimated Creatinine Clearance: 104.2 mL/min (A) (by C-G formula based on SCr of 0.49 mg/dL (L)). Liver Function Tests: Recent Labs  Lab 09/22/23 1558  AST 25  ALT 33  ALKPHOS 96  BILITOT 0.8  PROT 7.2  ALBUMIN 3.4*   No results for input(s): "LIPASE", "AMYLASE" in the last 168 hours. No results for input(s): "AMMONIA" in the last 168 hours. Coagulation Profile: No results for input(s): "INR", "PROTIME" in the last 168 hours. Cardiac Enzymes: No results for input(s): "CKTOTAL", "CKMB", "CKMBINDEX", "TROPONINI" in the last 168 hours. BNP (last 3 results) No results for input(s): "PROBNP" in the last 8760 hours. HbA1C: No  results for input(s): "HGBA1C" in the last 72 hours. CBG: Recent Labs  Lab 09/23/23 0810 09/23/23 1116 09/23/23 2208 09/24/23 0855 09/24/23 1233  GLUCAP 112* 157* 184* 201* 118*   Lipid Profile: Recent Labs    09/23/23 0315  CHOL 95  HDL 25*  LDLCALC 54  TRIG 81  CHOLHDL 3.8   Thyroid Function Tests: No results for input(s): "TSH", "T4TOTAL", "FREET4", "T3FREE", "THYROIDAB" in the last 72 hours. Anemia Panel: No results for input(s): "VITAMINB12", "FOLATE", "FERRITIN", "TIBC", "IRON", "RETICCTPCT" in the last 72 hours. Most Recent Urinalysis On File:     Component Value Date/Time   COLORURINE YELLOW (A) 09/22/2023 1641   APPEARANCEUR HAZY (A) 09/22/2023 1641   LABSPEC 1.035 (H) 09/22/2023 1641   PHURINE 5.0 09/22/2023 1641   GLUCOSEU >=500 (A) 09/22/2023 1641   HGBUR NEGATIVE 09/22/2023 1641   BILIRUBINUR NEGATIVE 09/22/2023 1641   KETONESUR 5 (A) 09/22/2023 1641   PROTEINUR NEGATIVE 09/22/2023 1641   NITRITE NEGATIVE 09/22/2023 1641   LEUKOCYTESUR NEGATIVE 09/22/2023 1641   Sepsis Labs: @LABRCNTIP (procalcitonin:4,lacticidven:4) Microbiology: No results found for this or any previous visit (from the past 240 hour(s)).    Radiology Studies last 3 days: ECHOCARDIOGRAM COMPLETE  BUBBLE STUDY  Result Date: 09/24/2023    ECHOCARDIOGRAM REPORT   Patient Name:   TERION GROSJEAN Date of Exam: 09/23/2023 Medical Rec #:  [id number]     Height:       72.0 in Accession #:    [id number]    Weight:       224.0 lb Date of Birth:  Aug 07, 1951     BSA:          2.236 m Patient Age:    72 years      BP:           131/78 mmHg Patient Gender: M             HR:           93 bpm. Exam Location:  [facility] Procedure: 2D Echo, Cardiac Doppler, Color Doppler, Saline Contrast Bubble Study            and Intracardiac Opacification Agent Indications:     Stroke 434.91 / I63.9  History:         Patient has no prior history of Echocardiogram examinations.                  Cardiomyopathy and CHF, CAD, Prior CABG, Stroke; Risk                  Factors:Hypertension, Diabetes and Dyslipidemia.  Sonographer:     Lucendia Herrlich RCS Referring Phys:  [id number] AMY N COX Diagnosing Phys: Alwyn Pea MD IMPRESSIONS  1. Left ventricular ejection fraction, by estimation, is 20 to 25%. The left ventricle has severely decreased function. The left ventricle demonstrates global hypokinesis. Left ventricular diastolic parameters are consistent with Grade II diastolic dysfunction (pseudonormalization).  2. Right ventricular systolic function is moderately reduced. The right ventricular size is mildly enlarged.  3. Left atrial size was mildly dilated.  4. The mitral valve is normal in structure. Mild mitral valve regurgitation.  5. The aortic valve is normal in structure. Aortic valve regurgitation is trivial. Aortic valve sclerosis is present, with no evidence of aortic valve stenosis. Conclusion(s)/Recommendation(s): No evidence of valvular vegetations on this transthoracic echocardiogram. Consider a transesophageal echocardiogram to exclude infective endocarditis if clinically indicated. No intracardiac source of embolism detected on  this transthoracic study. Consider  a transesophageal echocardiogram to exclude cardiac source of  embolism if clinically indicated. No left ventricular mural or apical thrombus/thrombi. FINDINGS  Left Ventricle: Left ventricular ejection fraction, by estimation, is 20 to 25%. The left ventricle has severely decreased function. The left ventricle demonstrates global hypokinesis. Definity contrast agent was given IV to delineate the left ventricular endocardial borders. The left ventricular internal cavity size was normal in size. There is no left ventricular hypertrophy. Left ventricular diastolic parameters are consistent with Grade II diastolic dysfunction (pseudonormalization). Right Ventricle: The right ventricular size is mildly enlarged. No increase in right ventricular wall thickness. Right ventricular systolic function is moderately reduced. Left Atrium: Left atrial size was mildly dilated. Right Atrium: Right atrial size was normal in size. Pericardium: There is no evidence of pericardial effusion. Mitral Valve: The mitral valve is normal in structure. Mild mitral valve regurgitation. Tricuspid Valve: The tricuspid valve is normal in structure. Tricuspid valve regurgitation is mild. Aortic Valve: The aortic valve is normal in structure. Aortic valve regurgitation is trivial. Aortic valve sclerosis is present, with no evidence of aortic valve stenosis. Aortic valve peak gradient measures 3.4 mmHg. Pulmonic Valve: The pulmonic valve was normal in structure. Pulmonic valve regurgitation is not visualized. Aorta: The ascending aorta was not well visualized. IAS/Shunts: No atrial level shunt detected by color flow Doppler.  LEFT VENTRICLE PLAX 2D LVIDd:         4.70 cm   Diastology LVIDs:         4.50 cm   LV e' medial:    10.20 cm/s LV PW:         1.00 cm   LV E/e' medial:  9.0 LV IVS:        1.00 cm   LV e' lateral:   10.60 cm/s LVOT diam:     2.40 cm   LV E/e' lateral: 8.7 LV SV:         61 LV SV Index:   27 LVOT Area:     4.52 cm  RIGHT VENTRICLE             IVC RV S prime:     10.60 cm/s  IVC diam: 2.20  cm TAPSE (M-mode): 1.3 cm LEFT ATRIUM             Index        RIGHT ATRIUM           Index LA diam:        4.80 cm 2.15 cm/m   RA Area:     24.30 cm LA Vol (A2C):   79.1 ml 35.38 ml/m  RA Volume:   73.45 ml  32.85 ml/m LA Vol (A4C):   80.3 ml 35.92 ml/m LA Biplane Vol: 84.6 ml 37.84 ml/m  AORTIC VALVE AV Area (Vmax): 3.84 cm AV Vmax:        92.60 cm/s AV Peak Grad:   3.4 mmHg LVOT Vmax:      78.53 cm/s LVOT Vmean:     52.033 cm/s LVOT VTI:       0.135 m  AORTA Ao Root diam: 3.83 cm Ao Asc diam:  3.50 cm MITRAL VALVE MV Area (PHT): 4.86 cm    SHUNTS MV Decel Time: 156 msec    Systemic VTI:  0.14 m MR Peak grad: 41.9 mmHg    Systemic Diam: 2.40 cm MR Vmax:      323.67 cm/s MV E velocity: 92.00 cm/s MV A velocity: 70.60 cm/s MV E/A ratio:  1.30 Alwyn Pea MD Electronically signed by Alwyn Pea MD Signature Date/Time: 09/24/2023/11:28:15 AM    Final    CT ANGIO HEAD NECK W WO CM  Result Date: 09/22/2023 CLINICAL DATA:  Follow-up examination for stroke. EXAM: CT ANGIOGRAPHY HEAD AND NECK WITH AND WITHOUT CONTRAST TECHNIQUE: Multidetector CT imaging of the head and neck was performed using the standard protocol during bolus administration of intravenous contrast. Multiplanar CT image reconstructions and MIPs were obtained to evaluate the vascular anatomy. Carotid stenosis measurements (when applicable) are obtained utilizing NASCET criteria, using the distal internal carotid diameter as the denominator. RADIATION DOSE REDUCTION: This exam was performed according to the departmental dose-optimization program which includes automated exposure control, adjustment of the mA and/or kV according to patient size and/or use of iterative reconstruction technique. CONTRAST:  75mL OMNIPAQUE IOHEXOL 350 MG/ML SOLN COMPARISON:  MRI from earlier the same day. FINDINGS: CT HEAD FINDINGS Brain: Cerebral volume within normal limits. Mild chronic microvascular ischemic disease. Previously identified left cerebral  infarcts grossly stable, better characterized on prior MRI. No associated hemorrhage or mass effect. No other acute large vessel territory infarct. No mass lesion or midline shift. No hydrocephalus or extra-axial fluid collection. Vascular: No abnormal hyperdense vessel. Skull: Scalp soft tissues demonstrate no acute finding. Calvarium intact. Sinuses/Orbits: Globes and orbital soft tissues within normal limits. Visualized paranasal sinuses and mastoid air cells are clear. Other: None. Review of the MIP images confirms the above findings CTA NECK FINDINGS Aortic arch: Visualized aortic arch within normal limits for caliber with standard 3 vessel morphology. Mild aortic atherosclerosis. No stenosis about the origin the great vessels. Right carotid system: Right common and internal carotid arteries are patent without dissection. Bulky calcified plaque about the right carotid bulb with associated stenosis of up to 60% by NASCET criteria. Left carotid system: Left common and internal carotid arteries are patent without dissection. Calcified plaque about the left carotid bulb with associated stenosis of up to 60% by NASCET criteria. Vertebral arteries: Both vertebral arteries arise from subclavian arteries. No proximal subclavian artery stenosis. Atheromatous change at the origin of the left vertebral artery with mild stenosis. Left vertebral artery dominant, with a diffusely hypoplastic right vertebral artery. Vertebral arteries otherwise patent without stenosis or dissection. Skeleton: No discrete or worrisome osseous lesions. Mild to moderate spondylosis noted at C5-6 and C6-7. Prior sternotomy noted. Other neck: No other acute finding. Upper chest: Linear subsegmental atelectatic changes noted within the left lung. No other acute finding. Review of the MIP images confirms the above findings CTA HEAD FINDINGS Anterior circulation: Extensive atheromatous plaque throughout the carotid siphons with secondary moderate  diffuse narrowing bilaterally. A1 segments patent bilaterally. Normal anterior communicating artery complex. Anterior cerebral arteries patent without significant stenosis. No M1 stenosis or occlusion. No proximal MCA branch occlusion or high-grade stenosis. Distal MCA branches perfused and symmetric. Posterior circulation: Both V4 segments patent without significant stenosis. Both PICA patent. Basilar patent without stenosis. Superior cerebellar arteries patent bilaterally. Both PCAs primarily supplied via the basilar. Short-segment moderate right P2 stenosis (series 11, image 20). Both PCAs otherwise patent to their distal aspects without significant stenosis. Venous sinuses: Patent allowing for timing the contrast bolus. Anatomic variants: As above.  No aneurysm. Review of the MIP images confirms the above findings IMPRESSION: CT HEAD: 1. Grossly stable left cerebral infarcts, better characterized on prior MRI. No associated hemorrhage or mass effect. 2. No other acute intracranial abnormality. CTA HEAD AND NECK: 1. Negative CTA for large vessel occlusion  or other emergent finding. 2. Bulky calcified plaque about the carotid bulbs with associated stenosis of up to 60% bilaterally. 3. Extensive atheromatous plaque throughout the carotid siphons with secondary moderate diffuse narrowing bilaterally. 4. Short-segment moderate right P2 stenosis. 5.  Aortic Atherosclerosis (ICD10-I70.0). Electronically Signed   By: Rise Mu M.D.   On: 09/22/2023 20:35   MR BRAIN WO CONTRAST  Result Date: 09/22/2023 CLINICAL DATA:  Right hand numbness over the last 2-3 weeks. Neuro deficit, acute, stroke suspected. EXAM: MRI HEAD WITHOUT CONTRAST TECHNIQUE: Multiplanar, multiecho pulse sequences of the brain and surrounding structures were obtained without intravenous contrast. COMPARISON:  CT head without contrast 01/15/2019. FINDINGS: Brain: Scattered foci of restricted diffusion are present over the left frontal and  parietal convexity. Largest in scratched at the large cortical infarct involves the postcentral gyrus, likely accounting for the right hand numbness. Whether cortical and subcortical foci are present. T2 and FLAIR hyperintensities are associated with the areas of acute/subacute infarction. Remote lacunar infarcts are present within the centrum semiovale bilaterally. Focal petechial hemorrhage is associated with the infarct of the left postcentral gyrus. Remote lacunar infarcts of the left thalamus are again noted. White matter changes extend into the brainstem. Vascular: Flow is present in the major intracranial arteries. Skull and upper cervical spine: The craniocervical junction is normal. Upper cervical spine is within normal limits. Marrow signal is unremarkable. Sinuses/Orbits: The paranasal sinuses and mastoid air cells are clear. Right lens replacement is present. Globes and orbits are otherwise within normal limits. IMPRESSION: 1. Scattered foci of acute/subacute infarction over the left frontal and parietal convexity. This may represent left-sided emboli or a watershed infarct. 2. The largest infarct involves the left postcentral gyrus, likely accounting for the right hand numbness. 3. Focal petechial hemorrhage associated with the infarct of the left postcentral gyrus. 4. Remote lacunar infarcts of the centrum semiovale bilaterally and left thalamus. 5. Underlying small vessel ischemic disease. These results were called by telephone at the time of interpretation on 09/22/2023 at 5:18 pm to provider [facility] , who verbally acknowledged these results. Electronically Signed   By: Marin Roberts M.D.   On: 09/22/2023 17:18       Time spent: 50 min    Sunnie Nielsen, DO [facility] 09/24/2023, 4:35 PM    Dictation software may have been used to generate the above note. Typos may occur and escape review in typed/dictated notes. Please contact Dr Lyn Hollingshead directly for clarity if  needed.  Staff may message me via secure chat in Epic  but this may not receive an immediate response,  please page me for urgent matters!  If 7PM-7AM, please contact night coverage [email or web address]

## 2023-09-25 ENCOUNTER — Telehealth: Payer: Self-pay | Admitting: *Deleted

## 2023-09-25 DIAGNOSIS — I639 Cerebral infarction, unspecified: Secondary | ICD-10-CM

## 2023-09-25 DIAGNOSIS — K810 Acute cholecystitis: Secondary | ICD-10-CM

## 2023-09-25 DIAGNOSIS — R2990 Unspecified symptoms and signs involving the nervous system: Secondary | ICD-10-CM | POA: Diagnosis not present

## 2023-09-25 LAB — BASIC METABOLIC PANEL
Anion gap: 9 (ref 5–15)
BUN: 20 mg/dL (ref 8–23)
CO2: 24 mmol/L (ref 22–32)
Calcium: 8.6 mg/dL — ABNORMAL LOW (ref 8.9–10.3)
Chloride: 103 mmol/L (ref 98–111)
Creatinine, Ser: 0.59 mg/dL — ABNORMAL LOW (ref 0.61–1.24)
GFR, Estimated: >60 mL/min (ref >60)
Glucose, Bld: 118 mg/dL — ABNORMAL HIGH (ref 70–99)
Potassium: 3.7 mmol/L (ref 3.5–5.1)
Sodium: 136 mmol/L (ref 135–145)

## 2023-09-25 LAB — GLUCOSE, CAPILLARY
Glucose-Capillary: 135 mg/dL — ABNORMAL HIGH (ref 70–99)
Glucose-Capillary: 138 mg/dL — ABNORMAL HIGH (ref 70–99)
Glucose-Capillary: 195 mg/dL — ABNORMAL HIGH (ref 70–99)
Glucose-Capillary: 149 mg/dL — ABNORMAL HIGH (ref 70–99)

## 2023-09-25 LAB — MAGNESIUM: Magnesium: 2 mg/dL (ref 1.7–2.4)

## 2023-09-25 MED ORDER — CARVEDILOL 3.125 MG PO TABS
3.1250 mg | ORAL_TABLET | Freq: Two times a day (BID) | ORAL | Status: DC
  Administered 2023-09-25 – 2023-09-26 (×2): 3.125 mg via ORAL
  Filled 2023-09-25 (×2): qty 1

## 2023-09-25 MED ORDER — SODIUM CHLORIDE 0.9% FLUSH
5.0000 mL | Freq: Every day | INTRAVENOUS | Status: DC
  Administered 2023-09-25 – 2023-09-29 (×4): 5 mL

## 2023-09-25 NOTE — Consult Note (Signed)
**Alexander [name] De-Identified via Obfuscation** [facility] CLINIC CARDIOLOGY CONSULT Alexander [name]       Patient ID: Alexander Alexander [name] MRN: [id number] DOB/AGE: 1951/06/25 72 y.o.  Admit date: 09/22/2023 Referring Physician Dr. Sunnie Nielsen Primary Physician Gauger, Hermenia Fiscal, NP  Primary Cardiologist Dr. Windell Norfolk Reason for Consultation preoperative cardiac evaluation  HPI: Alexander Alexander [name] is a 72 y.o. male  with a past medical history of coronary artery disease s/p bypass with SVG to first diagonal, LIMA from the SVG to the diagonal to the LAD, SVG to the proximal RCA and acute marginal branch of the RCA and an SVG to the OM1 in 2017 at [facility], ischemic cardiomyopathy, type 2 diabetes, and hypertension who presented to the ED on 09/22/2023 for hand numbness. MRI brain with acute/subacute infarction over the left frontal and parietal convexity, CTA head/neck with carotid stenosis. Vascular surgery is planning for L carotid intervention. Cardiology was consulted for preoperative evaluation.    Patient reports that about 2 weeks ago he fell at home. States he was walking when he suddenly had R leg weakness and fell. Reports at the same time he started noticing R hand numbness. This has been persistent and stable but since it did not go away he decided to come to the ED for evaluation. Workup in the ED notable for Cr 0.58, K 4.1, hgb 13.0, WBC 11.2. MRI brain demonstrated acute to subacute strokes left-sided hemisphere with petechial hemorrhage. He was started on asa and plavix by neurology. CTA head/neck with R carotid stenosis, vascular suspects this is causing symptoms. Echo demonstrated EF 20-25% (prior hx of HFrEF) and global hypokinesis. Vascular surgery is anticipating the need for intervention of carotid stenosis.  At the time of my evaluation this AM the patient is resting comfortably in bedside chair. Discussed his recent symptoms as well as his cardiac history. He had CABG in 2017 at [facility], has hx of ischemic cardiomyopathy. Denies any recent  issues with chest pain, shortness of breath, palpitations, exertional symptoms, dizziness, or syncope. Denies any LE edema. States that overall recently he has been feeling well and has had no cardiac issues. He also states that he is to be undergoing gallbladder removal and currently has a drain in which was placed in September.  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)    Pneumonia 07/05/2023   admitted to [facility]   Polyneuropathy    S/P CABG x 5 2017   Sepsis (HCC) 07/05/2023   Severe nonproliferative diabetic retinopathy of left eye (HCC)    Stroke (HCC) 2014   mini, tingling right hand    Past Surgical History:  Procedure Laterality Date   CARDIAC CATHETERIZATION Left 08/09/2016   Procedure: Left Heart Cath and Coronary Angiography;  Surgeon: Dalia Heading, MD;  Location: [facility] INVASIVE CV LAB;  Service: Cardiovascular;  Laterality: Left;   CATARACT EXTRACTION W/PHACO  Right 02/06/2023   Procedure: CATARACT EXTRACTION PHACO AND INTRAOCULAR LENS PLACEMENT (IOC) AHMED TUBE SHUNT W/ TUTOPLAST RIGHT DIABETIC  3.94   00:42.6;  Surgeon: Nevada Crane, MD;  Location: [facility];  Service: Ophthalmology;  Laterality: Right;   COLONOSCOPY WITH PROPOFOL N/A 02/02/2021   Procedure: COLONOSCOPY WITH PROPOFOL;  Surgeon: Regis Bill, MD;  Location: [facility] ENDOSCOPY;  Service: Endoscopy;  Laterality: N/A;   CORONARY ARTERY BYPASS GRAFT  2017    x5   IR EXCHANGE BILIARY DRAIN  09/01/2023   IR PERC CHOLECYSTOSTOMY  07/28/2023    Medications Prior to Admission  Medication Sig Dispense Refill Last Dose   acetaminophen (TYLENOL) 500 MG tablet Take 1,000 mg by mouth every 6 (six) hours as needed for mild pain or headache.   prn at prn   atorvastatin (LIPITOR) 80 MG tablet Take 1 tablet (80 mg total) by mouth daily. Hold until you see your doctor and have your liver enzymes repeated   09/22/2023 at am   carvedilol (COREG) 3.125 MG tablet Take 3.125 mg by mouth 2 (two) times daily with a meal.   09/22/2023 at am   empagliflozin (JARDIANCE) 25 MG TABS tablet Take 25 mg by mouth daily.   09/22/2023 at am   LANTUS SOLOSTAR 100 UNIT/ML Solostar Pen Inject 38 Units into the skin at bedtime.   09/21/2023 at pm   OZEMPIC, 2 MG/DOSE, 8 MG/3ML SOPN Inject 2 mg into the skin every 7 (seven) days. Sundays   Past Week at unknown   sodium chloride flush (NS) 0.9 % SOLN 5 mLs by Intracatheter route daily. 150 mL 1 unknown at unknown   aspirin EC 81 MG tablet Take 81 mg by mouth daily. (Patient not taking: Reported on 09/22/2023)   Not Taking at unknown   feeding supplement (ENSURE ENLIVE / ENSURE PLUS) LIQD Take 237 mLs by mouth 2 (two) times daily between meals. 237 mL 12    sacubitril-valsartan (ENTRESTO) 24-26 MG Take 1 tablet by mouth every 12 (twelve) hours. (Patient not taking: Reported on 09/15/2023)   Not Taking   Social History   Socioeconomic History   Marital status: Married    Spouse name: Not on file   Number of children: Not on file   Years of education: Not on file   Highest education level: Not on file  Occupational History   Not on file  Tobacco Use   Smoking status: Never    Passive exposure: Never   Smokeless tobacco: Never  Vaping Use   Vaping status: Never Used  Substance and Sexual Activity   Alcohol use: No   Drug use: Never   Sexual activity: Yes    Partners: Female  Other Topics Concern   Not on file  Social  History Narrative   Not on file   Social Determinants of Health   Financial Resource Strain: Not on file  Food Insecurity: No Food Insecurity (09/24/2023)   Hunger Vital Sign    Worried About Running Out of Food in the Last Year: Never true    Ran Out of Food in the Last Year: Never true  Transportation Needs: No Transportation Needs (09/24/2023)   PRAPARE - Administrator, Civil Service (Medical): No    Lack of Transportation (Non-Medical): No  Physical Activity: Not on file  Stress: Not on file  Social Connections: Not on file  Intimate Partner Violence: Not At Risk (09/24/2023)   Humiliation, Afraid, Rape, and Kick questionnaire  Fear of Current or Ex-Partner: No    Emotionally Abused: No    Physically Abused: No    Sexually Abused: No    Family History  Problem Relation Age of Onset   Hypertension Mother    Diabetes Mother      Vitals:   09/24/23 1950 09/25/23 0016 09/25/23 0357 09/25/23 0906  BP: 107/67 118/70 108/70 108/74  Pulse: 87 84 81 87  Resp: 18 18 16 19   Temp: 97.6 F (36.4 C) 97.9 F (36.6 C) 97.6 F (36.4 C) (!) 97.5 F (36.4 C)  TempSrc: Oral Oral Oral   SpO2: 99% 97% 96% 100%  Weight:      Height:        PHYSICAL EXAM General: Well appearing elderly male, well nourished, in no acute distress sitting upright in bedside chair. HEENT: Normocephalic and atraumatic. Neck: No JVD.  Lungs: Normal respiratory effort on room air. Clear bilaterally to auscultation. No wheezes, crackles, rhonchi.  Heart: HRRR. Normal S1 and S2 without gallops or murmurs.  Abdomen: Non-distended appearing.  Msk: Normal strength and tone for age. Extremities: Warm and well perfused. No clubbing, cyanosis. No edema.  Neuro: Alert and oriented X 3. Psych: Answers questions appropriately.   Labs: Basic Metabolic Panel: Recent Labs    09/24/23 0407 09/25/23 0552  NA 138 136  K 3.6 3.7  CL 105 103  CO2 22 24  GLUCOSE 167* 118*  BUN 20 20  CREATININE  0.49* 0.59*  CALCIUM 8.9 8.6*  MG 2.1 2.0   Liver Function Tests: Recent Labs    09/22/23 1558  AST 25  ALT 33  ALKPHOS 96  BILITOT 0.8  PROT 7.2  ALBUMIN 3.4*   No results for input(s): "LIPASE", "AMYLASE" in the last 72 hours. CBC: Recent Labs    09/22/23 1558  WBC 11.2*  NEUTROABS 8.1*  HGB 13.0  HCT 40.2  MCV 91.0  PLT 323   Cardiac Enzymes: No results for input(s): "CKTOTAL", "CKMB", "CKMBINDEX", "TROPONINIHS" in the last 72 hours. BNP: No results for input(s): "BNP" in the last 72 hours. D-Dimer: No results for input(s): "DDIMER" in the last 72 hours. Hemoglobin A1C: No results for input(s): "HGBA1C" in the last 72 hours. Fasting Lipid Panel: Recent Labs    09/23/23 0315  CHOL 95  HDL 25*  LDLCALC 54  TRIG 81  CHOLHDL 3.8   Thyroid Function Tests: No results for input(s): "TSH", "T4TOTAL", "T3FREE", "THYROIDAB" in the last 72 hours.  Invalid input(s): "FREET3" Anemia Panel: No results for input(s): "VITAMINB12", "FOLATE", "FERRITIN", "TIBC", "IRON", "RETICCTPCT" in the last 72 hours.   Radiology: ECHOCARDIOGRAM COMPLETE BUBBLE STUDY  Result Date: 09/24/2023    ECHOCARDIOGRAM REPORT   Patient Name:   Alexander Alexander [name] Date of Exam: 09/23/2023 Medical Rec #:  [id number]     Height:       72.0 in Accession #:    [id number]    Weight:       224.0 lb Date of Birth:  November 15, 1950     BSA:          2.236 m Patient Age:    72 years      BP:           131/78 mmHg Patient Gender: M             HR:           93 bpm. Exam Location:  [facility] Procedure: 2D Echo, Cardiac Doppler, Color Doppler, Saline Contrast Bubble Study  and Intracardiac Opacification Agent Indications:     Stroke 434.91 / I63.9  History:         Patient has no prior history of Echocardiogram examinations.                  Cardiomyopathy and CHF, CAD, Prior CABG, Stroke; Risk                  Factors:Hypertension, Diabetes and Dyslipidemia.  Sonographer:     Lucendia Herrlich RCS Referring Phys:   [id number] AMY N COX Diagnosing Phys: Alwyn Pea MD IMPRESSIONS  1. Left ventricular ejection fraction, by estimation, is 20 to 25%. The left ventricle has severely decreased function. The left ventricle demonstrates global hypokinesis. Left ventricular diastolic parameters are consistent with Grade II diastolic dysfunction (pseudonormalization).  2. Right ventricular systolic function is moderately reduced. The right ventricular size is mildly enlarged.  3. Left atrial size was mildly dilated.  4. The mitral valve is normal in structure. Mild mitral valve regurgitation.  5. The aortic valve is normal in structure. Aortic valve regurgitation is trivial. Aortic valve sclerosis is present, with no evidence of aortic valve stenosis. Conclusion(s)/Recommendation(s): No evidence of valvular vegetations on this transthoracic echocardiogram. Consider a transesophageal echocardiogram to exclude infective endocarditis if clinically indicated. No intracardiac source of embolism detected on  this transthoracic study. Consider a transesophageal echocardiogram to exclude cardiac source of embolism if clinically indicated. No left ventricular mural or apical thrombus/thrombi. FINDINGS  Left Ventricle: Left ventricular ejection fraction, by estimation, is 20 to 25%. The left ventricle has severely decreased function. The left ventricle demonstrates global hypokinesis. Definity contrast agent was given IV to delineate the left ventricular endocardial borders. The left ventricular internal cavity size was normal in size. There is no left ventricular hypertrophy. Left ventricular diastolic parameters are consistent with Grade II diastolic dysfunction (pseudonormalization). Right Ventricle: The right ventricular size is mildly enlarged. No increase in right ventricular wall thickness. Right ventricular systolic function is moderately reduced. Left Atrium: Left atrial size was mildly dilated. Right Atrium: Right atrial size was  normal in size. Pericardium: There is no evidence of pericardial effusion. Mitral Valve: The mitral valve is normal in structure. Mild mitral valve regurgitation. Tricuspid Valve: The tricuspid valve is normal in structure. Tricuspid valve regurgitation is mild. Aortic Valve: The aortic valve is normal in structure. Aortic valve regurgitation is trivial. Aortic valve sclerosis is present, with no evidence of aortic valve stenosis. Aortic valve peak gradient measures 3.4 mmHg. Pulmonic Valve: The pulmonic valve was normal in structure. Pulmonic valve regurgitation is not visualized. Aorta: The ascending aorta was not well visualized. IAS/Shunts: No atrial level shunt detected by color flow Doppler.  LEFT VENTRICLE PLAX 2D LVIDd:         4.70 cm   Diastology LVIDs:         4.50 cm   LV e' medial:    10.20 cm/s LV PW:         1.00 cm   LV E/e' medial:  9.0 LV IVS:        1.00 cm   LV e' lateral:   10.60 cm/s LVOT diam:     2.40 cm   LV E/e' lateral: 8.7 LV SV:         61 LV SV Index:   27 LVOT Area:     4.52 cm  RIGHT VENTRICLE             IVC RV S prime:  10.60 cm/s  IVC diam: 2.20 cm TAPSE (M-mode): 1.3 cm LEFT ATRIUM             Index        RIGHT ATRIUM           Index LA diam:        4.80 cm 2.15 cm/m   RA Area:     24.30 cm LA Vol (A2C):   79.1 ml 35.38 ml/m  RA Volume:   73.45 ml  32.85 ml/m LA Vol (A4C):   80.3 ml 35.92 ml/m LA Biplane Vol: 84.6 ml 37.84 ml/m  AORTIC VALVE AV Area (Vmax): 3.84 cm AV Vmax:        92.60 cm/s AV Peak Grad:   3.4 mmHg LVOT Vmax:      78.53 cm/s LVOT Vmean:     52.033 cm/s LVOT VTI:       0.135 m  AORTA Ao Root diam: 3.83 cm Ao Asc diam:  3.50 cm MITRAL VALVE MV Area (PHT): 4.86 cm    SHUNTS MV Decel Time: 156 msec    Systemic VTI:  0.14 m MR Peak grad: 41.9 mmHg    Systemic Diam: 2.40 cm MR Vmax:      323.67 cm/s MV E velocity: 92.00 cm/s MV A velocity: 70.60 cm/s MV E/A ratio:  1.30 Alwyn Pea MD Electronically signed by Alwyn Pea MD Signature Date/Time:  09/24/2023/11:28:15 AM    Final    CT ANGIO HEAD NECK W WO CM  Result Date: 09/22/2023 CLINICAL DATA:  Follow-up examination for stroke. EXAM: CT ANGIOGRAPHY HEAD AND NECK WITH AND WITHOUT CONTRAST TECHNIQUE: Multidetector CT imaging of the head and neck was performed using the standard protocol during bolus administration of intravenous contrast. Multiplanar CT image reconstructions and MIPs were obtained to evaluate the vascular anatomy. Carotid stenosis measurements (when applicable) are obtained utilizing NASCET criteria, using the distal internal carotid diameter as the denominator. RADIATION DOSE REDUCTION: This exam was performed according to the departmental dose-optimization program which includes automated exposure control, adjustment of the mA and/or kV according to patient size and/or use of iterative reconstruction technique. CONTRAST:  75mL OMNIPAQUE IOHEXOL 350 MG/ML SOLN COMPARISON:  MRI from earlier the same day. FINDINGS: CT HEAD FINDINGS Brain: Cerebral volume within normal limits. Mild chronic microvascular ischemic disease. Previously identified left cerebral infarcts grossly stable, better characterized on prior MRI. No associated hemorrhage or mass effect. No other acute large vessel territory infarct. No mass lesion or midline shift. No hydrocephalus or extra-axial fluid collection. Vascular: No abnormal hyperdense vessel. Skull: Scalp soft tissues demonstrate no acute finding. Calvarium intact. Sinuses/Orbits: Globes and orbital soft tissues within normal limits. Visualized paranasal sinuses and mastoid air cells are clear. Other: None. Review of the MIP images confirms the above findings CTA NECK FINDINGS Aortic arch: Visualized aortic arch within normal limits for caliber with standard 3 vessel morphology. Mild aortic atherosclerosis. No stenosis about the origin the great vessels. Right carotid system: Right common and internal carotid arteries are patent without dissection. Bulky  calcified plaque about the right carotid bulb with associated stenosis of up to 60% by NASCET criteria. Left carotid system: Left common and internal carotid arteries are patent without dissection. Calcified plaque about the left carotid bulb with associated stenosis of up to 60% by NASCET criteria. Vertebral arteries: Both vertebral arteries arise from subclavian arteries. No proximal subclavian artery stenosis. Atheromatous change at the origin of the left vertebral artery with mild stenosis. Left vertebral  artery dominant, with a diffusely hypoplastic right vertebral artery. Vertebral arteries otherwise patent without stenosis or dissection. Skeleton: No discrete or worrisome osseous lesions. Mild to moderate spondylosis noted at C5-6 and C6-7. Prior sternotomy noted. Other neck: No other acute finding. Upper chest: Linear subsegmental atelectatic changes noted within the left lung. No other acute finding. Review of the MIP images confirms the above findings CTA HEAD FINDINGS Anterior circulation: Extensive atheromatous plaque throughout the carotid siphons with secondary moderate diffuse narrowing bilaterally. A1 segments patent bilaterally. Normal anterior communicating artery complex. Anterior cerebral arteries patent without significant stenosis. No M1 stenosis or occlusion. No proximal MCA branch occlusion or high-grade stenosis. Distal MCA branches perfused and symmetric. Posterior circulation: Both V4 segments patent without significant stenosis. Both PICA patent. Basilar patent without stenosis. Superior cerebellar arteries patent bilaterally. Both PCAs primarily supplied via the basilar. Short-segment moderate right P2 stenosis (series 11, image 20). Both PCAs otherwise patent to their distal aspects without significant stenosis. Venous sinuses: Patent allowing for timing the contrast bolus. Anatomic variants: As above.  No aneurysm. Review of the MIP images confirms the above findings IMPRESSION: CT  HEAD: 1. Grossly stable left cerebral infarcts, better characterized on prior MRI. No associated hemorrhage or mass effect. 2. No other acute intracranial abnormality. CTA HEAD AND NECK: 1. Negative CTA for large vessel occlusion or other emergent finding. 2. Bulky calcified plaque about the carotid bulbs with associated stenosis of up to 60% bilaterally. 3. Extensive atheromatous plaque throughout the carotid siphons with secondary moderate diffuse narrowing bilaterally. 4. Short-segment moderate right P2 stenosis. 5.  Aortic Atherosclerosis (ICD10-I70.0). Electronically Signed   By: Rise Mu M.D.   On: 09/22/2023 20:35   MR BRAIN WO CONTRAST  Result Date: 09/22/2023 CLINICAL DATA:  Right hand numbness over the last 2-3 weeks. Neuro deficit, acute, stroke suspected. EXAM: MRI HEAD WITHOUT CONTRAST TECHNIQUE: Multiplanar, multiecho pulse sequences of the brain and surrounding structures were obtained without intravenous contrast. COMPARISON:  CT head without contrast 01/15/2019. FINDINGS: Brain: Scattered foci of restricted diffusion are present over the left frontal and parietal convexity. Largest in scratched at the large cortical infarct involves the postcentral gyrus, likely accounting for the right hand numbness. Whether cortical and subcortical foci are present. T2 and FLAIR hyperintensities are associated with the areas of acute/subacute infarction. Remote lacunar infarcts are present within the centrum semiovale bilaterally. Focal petechial hemorrhage is associated with the infarct of the left postcentral gyrus. Remote lacunar infarcts of the left thalamus are again noted. White matter changes extend into the brainstem. Vascular: Flow is present in the major intracranial arteries. Skull and upper cervical spine: The craniocervical junction is normal. Upper cervical spine is within normal limits. Marrow signal is unremarkable. Sinuses/Orbits: The paranasal sinuses and mastoid air cells are  clear. Right lens replacement is present. Globes and orbits are otherwise within normal limits. IMPRESSION: 1. Scattered foci of acute/subacute infarction over the left frontal and parietal convexity. This may represent left-sided emboli or a watershed infarct. 2. The largest infarct involves the left postcentral gyrus, likely accounting for the right hand numbness. 3. Focal petechial hemorrhage associated with the infarct of the left postcentral gyrus. 4. Remote lacunar infarcts of the centrum semiovale bilaterally and left thalamus. 5. Underlying small vessel ischemic disease. These results were called by telephone at the time of interpretation on 09/22/2023 at 5:18 pm to provider [facility] , who verbally acknowledged these results. Electronically Signed   By: Marin Roberts M.D.   On: 09/22/2023  17:18   IR EXCHANGE BILIARY DRAIN  Result Date: 09/01/2023 INDICATION: Routine exchange, surgery planned early December EXAM: Exchange of percutaneous cholecystostomy tube using fluoroscopic guidance MEDICATIONS: None ANESTHESIA/SEDATION: Local analgesia FLUOROSCOPY: Radiation Exposure Index (as provided by the fluoroscopic device): 1.2 minutes (25 mGy) COMPLICATIONS: None immediate. PROCEDURE: Informed written consent was obtained from the patient after a thorough discussion of the procedural risks, benefits and alternatives. All questions were addressed. Maximal Sterile Barrier Technique was utilized including caps, mask, sterile gowns, sterile gloves, sterile drape, hand hygiene and skin antiseptic. A timeout was performed prior to the initiation of the procedure. Patient was placed supine on the exam table. The right upper quadrant was prepped and draped in the standard sterile fashion with inclusion of the existing percutaneous cholecystostomy tube within the sterile field. The existing cholecystostomy tube was injected with contrast material, which demonstrated a decompressed gallbladder lumen. There is  brisk filling of the cystic duct and common bile duct, which are noted to be normal in caliber without focal filling defects. There is passage of contrast material through the ampulla into the small bowel. The locking loop was released, and over an Amplatz guidewire, the existing cholecystostomy tube was exchanged for a new, similar ten [place] cholecystostomy tube. Locking loop was formed, and injection of contrast material demonstrated appropriate location of the gallbladder lumen. It was secured to the skin using silk suture and a dressing. It was attached to bag drainage. The patient tolerated the procedure well without immediate complication. IMPRESSION: 1. Successful exchange of percutaneous cholecystostomy tube for a new, similar 10 French cholecystostomy tube. Cholecystostomy tube placed to bag drainage. 2. Cholangiogram demonstrated patent cystic duct and common bile duct without filling defect. Electronically Signed   By: Olive Bass M.D.   On: 09/01/2023 16:55    ECHO as above  TELEMETRY reviewed by me 09/25/2023: sinus rhythm rate 80s  EKG reviewed by me: sinus rhythm rate 89 bpm   Data reviewed by me 09/25/2023: last 24h vitals tele labs imaging I/O ED provider Alexander [name], admission H&P, vascular surgery notes  Principal Problem:   Stroke-like symptom Active Problems:   Cardiomyopathy, dilated (HCC)   Hypertension   Insulin dependent type 2 diabetes mellitus (HCC)   Microalbuminuria due to type 2 diabetes mellitus (HCC)   Polyneuropathy   Metabolic acidosis   Acute cholecystitis   Truncal obesity   Embolic stroke (HCC)   Hyperlipidemia   Leukocytosis    ASSESSMENT AND PLAN:  Alexander Alexander [name] is a 72 y.o. male  with a past medical history of coronary artery disease s/p bypass with SVG to first diagonal, LIMA from the SVG to the diagonal to the LAD, SVG to the proximal RCA and acute marginal branch of the RCA and an SVG to the OM1 in 2017 at [facility], ischemic cardiomyopathy, type 2 diabetes,  and hypertension who presented to the ED on 09/22/2023 for hand numbness. MRI brain with acute/subacute infarction over the left frontal and parietal convexity, CTA head/neck with carotid stenosis. Vascular surgery is planning for L carotid intervention. Cardiology was consulted for preoperative evaluation.    # Preoperative cardiac evaluation # Carotid artery stenosis # Acute CVA Patient with R sided weakness onset 2 weeks ago and persistent. MRI brain this admission with acute infarcts. CTA head/neck with carotid stenosis suspected to be cause of strokes. -Will plan to complete nuclear stress test for further evaluation.  -Further recommendations pending results of stress test.  # Coronary artery disease s/p CABG x5 in 2017 #  Ischemic cardiomyopathy  Patient with hx of CABG in 2017 and ischemic cardiomyopathy, HFrEF. He denies any recent exertional/anginal symptoms. Denies functional limitations. Echo this admission with EF 20-25%, global hypokinesis. Previously 40-45% in 12/2022, 30-35% in 2020. Denies SOB, orthopnea, LE edema. -See plan as above. -Continue atorvastatin 80 mg daily, aspirin 81 mg daily, plavix 75 mg daily. -Continue carvedilol 3.125 mg twice daily.   This patient's plan of care was discussed and created with Dr. Melton Alar and she is in agreement.  Signed: Gale Journey, PA-C  09/25/2023, 1:44 PM [facility]

## 2023-09-25 NOTE — Plan of Care (Signed)
  Problem: Education: Goal: Knowledge of disease or condition will improve Outcome: Progressing Goal: Knowledge of secondary prevention will improve (MUST DOCUMENT ALL) Outcome: Progressing Goal: Knowledge of patient specific risk factors will improve Loraine Leriche N/A or DELETE if not current risk factor) Outcome: Progressing   Problem: Ischemic Stroke/TIA Tissue Perfusion: Goal: Complications of ischemic stroke/TIA will be minimized Outcome: Progressing   Problem: Coping: Goal: Will verbalize positive feelings about self Outcome: Progressing Goal: Will identify appropriate support needs Outcome: Progressing   Problem: Health Behavior/Discharge Planning: Goal: Ability to manage health-related needs will improve Outcome: Progressing Goal: Goals will be collaboratively established with patient/family Outcome: Progressing   Problem: Self-Care: Goal: Ability to participate in self-care as condition permits will improve Outcome: Progressing Goal: Verbalization of feelings and concerns over difficulty with self-care will improve Outcome: Progressing Goal: Ability to communicate needs accurately will improve Outcome: Progressing   Problem: Nutrition: Goal: Risk of aspiration will decrease Outcome: Progressing Goal: Dietary intake will improve Outcome: Progressing   Problem: Education: Goal: Ability to describe self-care measures that may prevent or decrease complications (Diabetes Survival Skills Education) will improve Outcome: Progressing Goal: Individualized Educational Video(s) Outcome: Progressing   Problem: Coping: Goal: Ability to adjust to condition or change in health will improve Outcome: Progressing   Problem: Fluid Volume: Goal: Ability to maintain a balanced intake and output will improve Outcome: Progressing   Problem: Health Behavior/Discharge Planning: Goal: Ability to identify and utilize available resources and services will improve Outcome:  Progressing Goal: Ability to manage health-related needs will improve Outcome: Progressing   Problem: Metabolic: Goal: Ability to maintain appropriate glucose levels will improve Outcome: Progressing   Problem: Nutritional: Goal: Maintenance of adequate nutrition will improve Outcome: Progressing Goal: Progress toward achieving an optimal weight will improve Outcome: Progressing   Problem: Skin Integrity: Goal: Risk for impaired skin integrity will decrease Outcome: Progressing   Problem: Tissue Perfusion: Goal: Adequacy of tissue perfusion will improve Outcome: Progressing   Problem: Education: Goal: Knowledge of General Education information will improve Description: Including pain rating scale, medication(s)/side effects and non-pharmacologic comfort measures Outcome: Progressing   Problem: Health Behavior/Discharge Planning: Goal: Ability to manage health-related needs will improve Outcome: Progressing   Problem: Clinical Measurements: Goal: Ability to maintain clinical measurements within normal limits will improve Outcome: Progressing Goal: Will remain free from infection Outcome: Progressing Goal: Diagnostic test results will improve Outcome: Progressing Goal: Respiratory complications will improve Outcome: Progressing Goal: Cardiovascular complication will be avoided Outcome: Progressing   Problem: Activity: Goal: Risk for activity intolerance will decrease Outcome: Progressing   Problem: Nutrition: Goal: Adequate nutrition will be maintained Outcome: Progressing   Problem: Elimination: Goal: Will not experience complications related to bowel motility Outcome: Progressing Goal: Will not experience complications related to urinary retention Outcome: Progressing   Problem: Pain Management: Goal: General experience of comfort will improve Outcome: Progressing   Problem: Safety: Goal: Ability to remain free from injury will improve Outcome:  Progressing   Problem: Skin Integrity: Goal: Risk for impaired skin integrity will decrease Outcome: Progressing

## 2023-09-25 NOTE — Telephone Encounter (Signed)
Received Medical Clearance from Maura Crandall. Patients risk assessment is medium and is optimized for surgery.

## 2023-09-25 NOTE — Consult Note (Signed)
Date of Consultation:  09/25/2023  Requesting Physician:  Sunnie Nielsen  Reason for Consultation:  Cholecystostomy drain and cholecystitis  History of Present Illness: Alexander [name] is a 72 y.o. male scheduled for robotic assisted cholecystectomy on 09/28/23.  I saw him for H&P update on 09/18/23.  He had had a drain exchange and cholangiogram on 09/01/23 which showed a patent cystic duct and common bile duct.  He presented to the ED on 09/22/23 after a fall and felt weakness and numbness over his right side.  He had an MRI and CTA which showed scattered foci of infarcts along the left frontal and parietal areas, and up to 60% stenosis of bilateral carotid arteries.  He is being evaluated by vascular surgery for carotid intervention.  He had an echocardiogram as well which showed significantly decreased EF of 20-25%, down from 40-45% on prior echo on 01/09/23.  Given planned carotid procedure, he would have to be on dual antiplatelet therapy with Plavix and Aspirin, and we're being asked about the urgency of his cholecystectomy.  The patient denies any abdominal pain except intermittently at the drain insertion site itself.  He continues flushing his drain without issues.  He is otherwise tolerating regular diet at home without recurrence of RUQ pain.  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)    Pneumonia 07/05/2023   admitted to [facility]   Polyneuropathy    S/P CABG x 5 2017   Sepsis (HCC) 07/05/2023   Severe  nonproliferative diabetic retinopathy of left eye (HCC)    Stroke (HCC) 2014   mini, tingling right hand     Past Surgical History: Past Surgical History:  Procedure Laterality Date   CARDIAC CATHETERIZATION Left 08/09/2016   Procedure: Left Heart Cath and Coronary Angiography;  Surgeon: Dalia Heading, MD;  Location: [facility] INVASIVE CV [facility];  Service: Cardiovascular;  Laterality: Left;   CATARACT EXTRACTION W/PHACO Right 02/06/2023   Procedure: CATARACT EXTRACTION PHACO AND INTRAOCULAR LENS PLACEMENT (IOC) AHMED TUBE SHUNT W/ TUTOPLAST RIGHT DIABETIC  3.94   00:42.6;  Surgeon: Nevada Crane, MD;  Location: [facility];  Service: Ophthalmology;  Laterality: Right;   COLONOSCOPY WITH PROPOFOL N/A 02/02/2021   Procedure: COLONOSCOPY WITH PROPOFOL;  Surgeon: Regis Bill, MD;  Location: [facility] ENDOS[facility];  Service: Endoscopy;  Laterality: N/A;   CORONARY ARTERY BYPASS GRAFT  2017   x5   IR EXCHANGE BILIARY DRAIN  09/01/2023   IR PERC CHOLECYSTOSTOMY  07/28/2023    Home Medications: Prior to Admission medications   Medication Sig Start Date End Date Taking? Authorizing Provider  acetaminophen (TYLENOL) 500 MG tablet Take 1,000 mg by mouth every 6 (six) hours as needed for mild pain or headache.   Yes [provider]  atorvastatin (LIPITOR) 80 MG tablet Take 1 tablet (80 mg total) by mouth daily. Hold until you see your doctor and have your liver enzymes repeated  07/30/23  Yes Arnetha Courser, MD  carvedilol (COREG) 3.125 MG tablet Take 3.125 mg by mouth 2 (two) times daily with a meal.   Yes [provider]  empagliflozin (JARDIANCE) 25 MG TABS tablet Take 25 mg by mouth daily. 12/27/18  Yes [provider]  LANTUS SOLOSTAR 100 UNIT/ML Solostar Pen Inject 38 Units into the skin at bedtime. 07/14/23  Yes [provider]  OZEMPIC, 2 MG/DOSE, 8 MG/3ML SOPN Inject 2 mg into the skin every 7 (seven) days. Sundays   Yes [provider]  sodium  chloride flush (NS) 0.9 % SOLN 5 mLs by Intracatheter route daily. 08/16/23 10/15/23 Yes Lourdez Mcgahan, Elita Quick, MD  aspirin EC 81 MG tablet Take 81 mg by mouth daily. Patient not taking: Reported on 09/22/2023    [provider]  feeding supplement (ENSURE ENLIVE / ENSURE PLUS) LIQD Take 237 mLs by mouth 2 (two) times daily between meals. 07/30/23   Arnetha Courser, MD  sacubitril-valsartan (ENTRESTO) 24-26 MG Take 1 tablet by mouth every 12 (twelve) hours. Patient not taking: Reported on 09/15/2023 07/25/23   [provider]    Allergies: No Known Allergies  Social History:  reports that he has never smoked. He has never been exposed to tobacco smoke. He has never used smokeless tobacco. He reports that he does not drink alcohol and does not use drugs.   Family History: Family History  Problem Relation Age of Onset   Hypertension Mother    Diabetes Mother     Review of Systems: Review of Systems  Constitutional:  Negative for chills and fever.  Respiratory:  Negative for shortness of breath.   Cardiovascular:  Negative for chest pain.  Gastrointestinal:  Negative for abdominal pain, nausea and vomiting.  Neurological:  Positive for sensory change and focal weakness.    Physical Exam BP 107/63 (BP Location: Right Arm)   Pulse 85   Temp 98.2 F (36.8 C)   Resp 17   Ht 6' (1.829 m)   Wt 101 kg   SpO2 100%   BMI 30.20 kg/m  CONSTITUTIONAL: No acute distress HEENT:  Normocephalic, atraumatic, extraocular motion intact. NECK: Trachea is midline, and there is no jugular venous distension. RESPIRATORY:  Normal respiratory effort without pathologic use of accessory muscles. CARDIOVASCULAR: Regular rhythm and rate. GI: The abdomen is soft, non-distended, non-tender to palpation.  RUQ drain stable with bilious/purulent fluid. MUSCULOSKELETAL:  No peripheral edema or cyanosis. SKIN: Skin turgor is normal. There are no pathologic skin lesions.  NEUROLOGIC:  Cranial nerves  are grossly intact. PSYCH:  Alert and oriented to person, place and time. Affect is normal.  Laboratory Analysis: Results for orders placed or performed during the hospital encounter of 09/22/23 (from the past 24 hour(s))  Basic metabolic panel     Status: Abnormal   Collection Time: 09/25/23  5:52 AM  Result Value Ref Range   Sodium 136 135 - 145 mmol/L   Potassium 3.7 3.5 - 5.1 mmol/L   Chloride 103 98 - 111 mmol/L   CO2 24 22 - 32 mmol/L   Glucose, Bld 118 (H) 70 - 99 mg/dL   BUN 20 8 - 23 mg/dL   Creatinine, Ser 8.41 (L) 0.61 - 1.24 mg/dL   Calcium 8.6 (L) 8.9 - 10.3 mg/dL   GFR, Estimated >32 >44 mL/min   Anion gap 9 5 - 15  Magnesium     Status: None   Collection Time: 09/25/23  5:52 AM  Result Value Ref Range  Magnesium 2.0 1.7 - 2.4 mg/dL  Glucose, capillary     Status: Abnormal   Collection Time: 09/25/23  9:05 AM  Result Value Ref Range   Glucose-Capillary 135 (H) 70 - 99 mg/dL  Glucose, capillary     Status: Abnormal   Collection Time: 09/25/23 12:28 PM  Result Value Ref Range   Glucose-Capillary 138 (H) 70 - 99 mg/dL  Glucose, capillary     Status: Abnormal   Collection Time: 09/25/23  4:58 PM  Result Value Ref Range   Glucose-Capillary 195 (H) 70 - 99 mg/dL  Glucose, capillary     Status: Abnormal   Collection Time: 09/25/23  9:18 PM  Result Value Ref Range   Glucose-Capillary 149 (H) 70 - 99 mg/dL    Imaging: MRI Brain 34/74/25: IMPRESSION: 1. Scattered foci of acute/subacute infarction over the left frontal and parietal convexity. This may represent left-sided emboli or a watershed infarct. 2. The largest infarct involves the left postcentral gyrus, likely accounting for the right hand numbness. 3. Focal petechial hemorrhage associated with the infarct of the left postcentral gyrus. 4. Remote lacunar infarcts of the centrum semiovale bilaterally and left thalamus. 5. Underlying small vessel ischemic disease.  CTA neck/head on 09/22/23: IMPRESSION: CT  HEAD:  1. Grossly stable left cerebral infarcts, better characterized on prior MRI. No associated hemorrhage or mass effect. 2. No other acute intracranial abnormality.   CTA HEAD AND NECK:  1. Negative CTA for large vessel occlusion or other emergent finding. 2. Bulky calcified plaque about the carotid bulbs with associated stenosis of up to 60% bilaterally. 3. Extensive atheromatous plaque throughout the carotid siphons with secondary moderate diffuse narrowing bilaterally. 4. Short-segment moderate right P2 stenosis. 5.  Aortic Atherosclerosis (ICD10-I70.0).   Assessment and Plan: This is a 72 y.o. male with left sided frontal/parietal infarcts.  --Discussed with the patient that given the findings, and that overall he's been very stable from the gallbladder standpoint, there is no urgency in proceeding with cholecystectomy at this point, and carotid procedures are of most importance.  He is also getting a stress test tomorrow to further evaluate for his significant decrease in EF over the past 8 months.  At this point, he's overall asymptomatic from gallbladder standpoint.  His cystic duct and common bile duct are patent based on last cholangiogram last month.   --Discussed with him that for now we will continue with the percutaneous cholecystostomy drain.  He would need serial drain exchanges.  There is the possibility of possibly removing the drain but this would put him at risk of developing cholecystitis in the future.  It may be possible to cap the drain in the future if his cholangiogram remains normal.  For now, will continue flushing drain once daily.   --Will discuss with IR so they can set up the next drain exchange.  I spent 45 minutes dedicated to the care of this patient on the date of this encounter to include pre-visit review of records, face-to-face time with the patient discussing diagnosis and management, and any post-visit coordination of care.   Howie Ill,  MD  [facility]:  [phone]

## 2023-09-25 NOTE — Progress Notes (Signed)
PROGRESS NOTE    Alexander [name]   UYQ:[id number] DOB: [birth year]  DOA: 09/22/2023 Date of Service: 09/25/23 which is hospital day 0  PCP: Myrene Buddy, NP    HPI: [name] is a 72 male with history of hyperlipidemia, possible history of TIA, insulin-dependent diabetes melitis type II, hypertension, history of acute cholecystitis, who presents emergency department for chief concerns of right hand numbness for 2 to 3 weeks. It has gradually worsened, prompting him to present to the emergency department for further evaluation as he is supposed to have cholecystectomy next week. He wanted to figure this out before the surgery.   Hospital course / significant events:  11/29: admitted to hospitalist service for concern for CVA 11/30: (+)Carotid disease on CTA 12/01: Vascular consult - possible for carotid endarterectomy, will discuss tomorrow (today is Sunday). Echo also showing worsening EF 20-25% and grade 2 diastolic dysfunction w/ global hypokinesis - will need cardiology clearance prior to anesthesia 12/02: vascular planning intervention on carotid stenosis pending cardiology eval.   Consultants:  Neurology Vascular surgery  Cardiology   Procedures/Surgeries: none      ASSESSMENT & PLAN:    Stroke Left frontal and parietal convexity with scattered foci, concerning for left-sided emboli or watershed infarct. Largest infarct involving the left postcentral gyrus, can account for the right hand numbness.  Plavix 75 mg daily, aspirin 81 mg daily.     Carotid artery stenosis vascular planning intervention on carotid stenosis pending cardiology eval.   Chronic HFrEF Echo showing worsening EF but does not appear to be in exacerbation at this time  will need cardiology clearance prior to anesthesia Diuresis as needed Cardiology recs pending   Acute cholecystitis Drain is in place continue outpatient follow-up with general surgery will likely need cardiology  clearance prior to anesthesia, discussed today w/ Dr Aleen Campi, he will plan reschedule cholecystectomy    Metabolic acidosis - resolved Sodium bicarbonate 650 mg p.o. twice daily, 2 doses ordered on admission Monitor BMP    Hypertension Home Coreg 1.6 mg p.o. twice daily with meals resumed   Insulin dependent type 2 diabetes mellitus (HCC) A1c was 6.7 on 07/29/23 Insulin SSI with at bedtime coverage ordered   Leukocytosis Mild leukocytosis, suspect reactive in setting of stroke Monitor periodic CBC   Hyperlipidemia Atorvastatin 80 mg daily   obese based on BMI: Body mass index is 30.2 kg/m.  Underweight - under 18.5  normal weight - 18.5 to 24.9 overweight - 25 to 29.9 obese - 30 or more   DVT prophylaxis: lovenox  IV fluids: no continuous IV fluids  Nutrition: cardiac diet, will make NPO at midnight in case possibble procedure tomorrow  Central lines / invasive devices: biliary drain  Code Status: FULL CODE ACP documentation reviewed:  none on file in [facility]  TOC needs: TBD Barriers to dispo / significant pending items: vascular / cardiology workup as above, anticipate may be here couple more days              Subjective / Brief ROS:  Patient reports doing well this morning  No concerns  Denies CP/SOB.  Pain controlled.  Denies new weakness.  Tolerating diet.  Reports no concerns w/ urination/defecation.   Family Communication: family at bedside on rounds     Objective Findings:  Vitals:   09/24/23 1950 09/25/23 0016 09/25/23 0357 09/25/23 0906  BP: 107/67 118/70 108/70 108/74  Pulse: 87 84 81 87  Resp: 18 18 16 19   Temp: 97.6 F (  36.4 C) 97.9 F (36.6 C) 97.6 F (36.4 C) (!) 97.5 F (36.4 C)  TempSrc: Oral Oral Oral   SpO2: 99% 97% 96% 100%  Weight:      Height:       No intake or output data in the 24 hours ending 09/25/23 1215  Filed Weights   09/22/23 1433 09/22/23 1839 09/23/23 2023  Weight: 101.6 kg 101.6 kg 101 kg     Examination:  Physical Exam Constitutional:      General: He is not in acute distress.    Appearance: Normal appearance.  Cardiovascular:     Rate and Rhythm: Normal rate and regular rhythm.  Pulmonary:     Effort: Pulmonary effort is normal.     Breath sounds: Normal breath sounds.  Musculoskeletal:     Right lower leg: No edema.     Left lower leg: No edema.  Skin:    General: Skin is warm and dry.  Neurological:     Mental Status: He is alert and oriented to person, place, and time.  Psychiatric:        Mood and Affect: Mood normal.        Behavior: Behavior normal.          Scheduled Medications:    stroke: early stages of recovery book   Does not apply Once   aspirin EC  81 mg Oral Daily   atorvastatin  80 mg Oral QHS   carvedilol  1.56 mg Oral BID WC   clopidogrel  75 mg Oral Daily   enoxaparin (LOVENOX) injection  0.5 mg/kg Subcutaneous Q24H   insulin aspart  0-15 Units Subcutaneous TID WC   insulin aspart  0-5 Units Subcutaneous QHS   insulin glargine-yfgn  20 Units Subcutaneous QHS    Continuous Infusions:   PRN Medications:  acetaminophen **OR** acetaminophen (TYLENOL) oral liquid 160 mg/5 mL **OR** acetaminophen, hydrALAZINE, senna-docusate  Antimicrobials from admission:  Anti-infectives (From admission, onward)    None           Data Reviewed:  I have personally reviewed the following...  CBC: Recent Labs  Lab 09/22/23 1558  WBC 11.2*  NEUTROABS 8.1*  HGB 13.0  HCT 40.2  MCV 91.0  PLT 323   Basic Metabolic Panel: Recent Labs  Lab 09/22/23 1558 09/24/23 0407 09/25/23 0552  NA 134* 138 136  K 4.1 3.6 3.7  CL 103 105 103  CO2 18* 22 24  GLUCOSE 125* 167* 118*  BUN 23 20 20   CREATININE 0.58* 0.49* 0.59*  CALCIUM 8.4* 8.9 8.6*  MG  --  2.1 2.0   GFR: Estimated Creatinine Clearance: 104.2 mL/min (A) (by C-G formula based on SCr of 0.59 mg/dL (L)). Liver Function Tests: Recent Labs  Lab 09/22/23 1558  AST 25   ALT 33  ALKPHOS 96  BILITOT 0.8  PROT 7.2  ALBUMIN 3.4*   No results for input(s): "LIPASE", "AMYLASE" in the last 168 hours. No results for input(s): "AMMONIA" in the last 168 hours. Coagulation Profile: No results for input(s): "INR", "PROTIME" in the last 168 hours. Cardiac Enzymes: No results for input(s): "CKTOTAL", "CKMB", "CKMBINDEX", "TROPONINI" in the last 168 hours. BNP (last 3 results) No results for input(s): "PROBNP" in the last 8760 hours. HbA1C: No results for input(s): "HGBA1C" in the last 72 hours. CBG: Recent Labs  Lab 09/24/23 0855 09/24/23 1233 09/24/23 1642 09/24/23 2115 09/25/23 0905  GLUCAP 201* 118* 171* 204* 135*   Lipid Profile: Recent Labs  09/23/23 0315  CHOL 95  HDL 25*  LDLCALC 54  TRIG 81  CHOLHDL 3.8   Thyroid Function Tests: No results for input(s): "TSH", "T4TOTAL", "FREET4", "T3FREE", "THYROIDAB" in the last 72 hours. Anemia Panel: No results for input(s): "VITAMINB12", "FOLATE", "FERRITIN", "TIBC", "IRON", "RETICCTPCT" in the last 72 hours. Most Recent Urinalysis On File:     Component Value Date/Time   COLORURINE YELLOW (A) 09/22/2023 1641   APPEARANCEUR HAZY (A) 09/22/2023 1641   LABSPEC 1.035 (H) 09/22/2023 1641   PHURINE 5.0 09/22/2023 1641   GLUCOSEU >=500 (A) 09/22/2023 1641   HGBUR NEGATIVE 09/22/2023 1641   BILIRUBINUR NEGATIVE 09/22/2023 1641   KETONESUR 5 (A) 09/22/2023 1641   PROTEINUR NEGATIVE 09/22/2023 1641   NITRITE NEGATIVE 09/22/2023 1641   LEUKOCYTESUR NEGATIVE 09/22/2023 1641   Sepsis Labs: @LABRCNTIP (procalcitonin:4,lacticidven:4) Microbiology: No results found for this or any previous visit (from the past 240 hour(s)).    Radiology Studies last 3 days: ECHOCARDIOGRAM COMPLETE BUBBLE STUDY  Result Date: 09/24/2023    ECHOCARDIOGRAM REPORT   Patient Name:   SUHAN DUMAS Date of Exam: 09/23/2023 Medical Rec #:  [id number]     Height:       72.0 in Accession #:    [id number]    Weight:       224.0  lb Date of Birth:  06-21-1951     BSA:          2.236 m Patient Age:    72 years      BP:           131/78 mmHg Patient Gender: M             HR:           93 bpm. Exam Location:  [facility] Procedure: 2D Echo, Cardiac Doppler, Color Doppler, Saline Contrast Bubble Study            and Intracardiac Opacification Agent Indications:     Stroke 434.91 / I63.9  History:         Patient has no prior history of Echocardiogram examinations.                  Cardiomyopathy and CHF, CAD, Prior CABG, Stroke; Risk                  Factors:Hypertension, Diabetes and Dyslipidemia.  Sonographer:     Lucendia Herrlich RCS Referring Phys:  [id number] AMY N COX Diagnosing Phys: Alwyn Pea MD IMPRESSIONS  1. Left ventricular ejection fraction, by estimation, is 20 to 25%. The left ventricle has severely decreased function. The left ventricle demonstrates global hypokinesis. Left ventricular diastolic parameters are consistent with Grade II diastolic dysfunction (pseudonormalization).  2. Right ventricular systolic function is moderately reduced. The right ventricular size is mildly enlarged.  3. Left atrial size was mildly dilated.  4. The mitral valve is normal in structure. Mild mitral valve regurgitation.  5. The aortic valve is normal in structure. Aortic valve regurgitation is trivial. Aortic valve sclerosis is present, with no evidence of aortic valve stenosis. Conclusion(s)/Recommendation(s): No evidence of valvular vegetations on this transthoracic echocardiogram. Consider a transesophageal echocardiogram to exclude infective endocarditis if clinically indicated. No intracardiac source of embolism detected on  this transthoracic study. Consider a transesophageal echocardiogram to exclude cardiac source of embolism if clinically indicated. No left ventricular mural or apical thrombus/thrombi. FINDINGS  Left Ventricle: Left ventricular ejection fraction, by estimation, is 20 to 25%. The left ventricle has severely decreased  function. The left ventricle demonstrates global hypokinesis. Definity contrast agent was given IV to delineate the left ventricular endocardial borders. The left ventricular internal cavity size was normal in size. There is no left ventricular hypertrophy. Left ventricular diastolic parameters are consistent with Grade II diastolic dysfunction (pseudonormalization). Right Ventricle: The right ventricular size is mildly enlarged. No increase in right ventricular wall thickness. Right ventricular systolic function is moderately reduced. Left Atrium: Left atrial size was mildly dilated. Right Atrium: Right atrial size was normal in size. Pericardium: There is no evidence of pericardial effusion. Mitral Valve: The mitral valve is normal in structure. Mild mitral valve regurgitation. Tricuspid Valve: The tricuspid valve is normal in structure. Tricuspid valve regurgitation is mild. Aortic Valve: The aortic valve is normal in structure. Aortic valve regurgitation is trivial. Aortic valve sclerosis is present, with no evidence of aortic valve stenosis. Aortic valve peak gradient measures 3.4 mmHg. Pulmonic Valve: The pulmonic valve was normal in structure. Pulmonic valve regurgitation is not visualized. Aorta: The ascending aorta was not well visualized. IAS/Shunts: No atrial level shunt detected by color flow Doppler.  LEFT VENTRICLE PLAX 2D LVIDd:         4.70 cm   Diastology LVIDs:         4.50 cm   LV e' medial:    10.20 cm/s LV PW:         1.00 cm   LV E/e' medial:  9.0 LV IVS:        1.00 cm   LV e' lateral:   10.60 cm/s LVOT diam:     2.40 cm   LV E/e' lateral: 8.7 LV SV:         61 LV SV Index:   27 LVOT Area:     4.52 cm  RIGHT VENTRICLE             IVC RV S prime:     10.60 cm/s  IVC diam: 2.20 cm TAPSE (M-mode): 1.3 cm LEFT ATRIUM             Index        RIGHT ATRIUM           Index LA diam:        4.80 cm 2.15 cm/m   RA Area:     24.30 cm LA Vol (A2C):   79.1 ml 35.38 ml/m  RA Volume:   73.45 ml  32.85  ml/m LA Vol (A4C):   80.3 ml 35.92 ml/m LA Biplane Vol: 84.6 ml 37.84 ml/m  AORTIC VALVE AV Area (Vmax): 3.84 cm AV Vmax:        92.60 cm/s AV Peak Grad:   3.4 mmHg LVOT Vmax:      78.53 cm/s LVOT Vmean:     52.033 cm/s LVOT VTI:       0.135 m  AORTA Ao Root diam: 3.83 cm Ao Asc diam:  3.50 cm MITRAL VALVE MV Area (PHT): 4.86 cm    SHUNTS MV Decel Time: 156 msec    Systemic VTI:  0.14 m MR Peak grad: 41.9 mmHg    Systemic Diam: 2.40 cm MR Vmax:      323.67 cm/s MV E velocity: 92.00 cm/s MV A velocity: 70.60 cm/s MV E/A ratio:  1.30 Alwyn Pea MD Electronically signed by Alwyn Pea MD Signature Date/Time: 09/24/2023/11:28:15 AM    Final    CT ANGIO HEAD NECK W WO CM  Result Date: 09/22/2023 CLINICAL DATA:  Follow-up examination for  stroke. EXAM: CT ANGIOGRAPHY HEAD AND NECK WITH AND WITHOUT CONTRAST TECHNIQUE: Multidetector CT imaging of the head and neck was performed using the standard protocol during bolus administration of intravenous contrast. Multiplanar CT image reconstructions and MIPs were obtained to evaluate the vascular anatomy. Carotid stenosis measurements (when applicable) are obtained utilizing NASCET criteria, using the distal internal carotid diameter as the denominator. RADIATION DOSE REDUCTION: This exam was performed according to the departmental dose-optimization program which includes automated exposure control, adjustment of the mA and/or kV according to patient size and/or use of iterative reconstruction technique. CONTRAST:  75mL OMNIPAQUE IOHEXOL 350 MG/ML SOLN COMPARISON:  MRI from earlier the same day. FINDINGS: CT HEAD FINDINGS Brain: Cerebral volume within normal limits. Mild chronic microvascular ischemic disease. Previously identified left cerebral infarcts grossly stable, better characterized on prior MRI. No associated hemorrhage or mass effect. No other acute large vessel territory infarct. No mass lesion or midline shift. No hydrocephalus or extra-axial  fluid collection. Vascular: No abnormal hyperdense vessel. Skull: Scalp soft tissues demonstrate no acute finding. Calvarium intact. Sinuses/Orbits: Globes and orbital soft tissues within normal limits. Visualized paranasal sinuses and mastoid air cells are clear. Other: None. Review of the MIP images confirms the above findings CTA NECK FINDINGS Aortic arch: Visualized aortic arch within normal limits for caliber with standard 3 vessel morphology. Mild aortic atherosclerosis. No stenosis about the origin the great vessels. Right carotid system: Right common and internal carotid arteries are patent without dissection. Bulky calcified plaque about the right carotid bulb with associated stenosis of up to 60% by NASCET criteria. Left carotid system: Left common and internal carotid arteries are patent without dissection. Calcified plaque about the left carotid bulb with associated stenosis of up to 60% by NASCET criteria. Vertebral arteries: Both vertebral arteries arise from subclavian arteries. No proximal subclavian artery stenosis. Atheromatous change at the origin of the left vertebral artery with mild stenosis. Left vertebral artery dominant, with a diffusely hypoplastic right vertebral artery. Vertebral arteries otherwise patent without stenosis or dissection. Skeleton: No discrete or worrisome osseous lesions. Mild to moderate spondylosis noted at C5-6 and C6-7. Prior sternotomy noted. Other neck: No other acute finding. Upper chest: Linear subsegmental atelectatic changes noted within the left lung. No other acute finding. Review of the MIP images confirms the above findings CTA HEAD FINDINGS Anterior circulation: Extensive atheromatous plaque throughout the carotid siphons with secondary moderate diffuse narrowing bilaterally. A1 segments patent bilaterally. Normal anterior communicating artery complex. Anterior cerebral arteries patent without significant stenosis. No M1 stenosis or occlusion. No proximal  MCA branch occlusion or high-grade stenosis. Distal MCA branches perfused and symmetric. Posterior circulation: Both V4 segments patent without significant stenosis. Both PICA patent. Basilar patent without stenosis. Superior cerebellar arteries patent bilaterally. Both PCAs primarily supplied via the basilar. Short-segment moderate right P2 stenosis (series 11, image 20). Both PCAs otherwise patent to their distal aspects without significant stenosis. Venous sinuses: Patent allowing for timing the contrast bolus. Anatomic variants: As above.  No aneurysm. Review of the MIP images confirms the above findings IMPRESSION: CT HEAD: 1. Grossly stable left cerebral infarcts, better characterized on prior MRI. No associated hemorrhage or mass effect. 2. No other acute intracranial abnormality. CTA HEAD AND NECK: 1. Negative CTA for large vessel occlusion or other emergent finding. 2. Bulky calcified plaque about the carotid bulbs with associated stenosis of up to 60% bilaterally. 3. Extensive atheromatous plaque throughout the carotid siphons with secondary moderate diffuse narrowing bilaterally. 4. Short-segment moderate right P2 stenosis.  5.  Aortic Atherosclerosis (ICD10-I70.0). Electronically Signed   By: Rise Mu M.D.   On: 09/22/2023 20:35   MR BRAIN WO CONTRAST  Result Date: 09/22/2023 CLINICAL DATA:  Right hand numbness over the last 2-3 weeks. Neuro deficit, acute, stroke suspected. EXAM: MRI HEAD WITHOUT CONTRAST TECHNIQUE: Multiplanar, multiecho pulse sequences of the brain and surrounding structures were obtained without intravenous contrast. COMPARISON:  CT head without contrast 01/15/2019. FINDINGS: Brain: Scattered foci of restricted diffusion are present over the left frontal and parietal convexity. Largest in scratched at the large cortical infarct involves the postcentral gyrus, likely accounting for the right hand numbness. Whether cortical and subcortical foci are present. T2 and FLAIR  hyperintensities are associated with the areas of acute/subacute infarction. Remote lacunar infarcts are present within the centrum semiovale bilaterally. Focal petechial hemorrhage is associated with the infarct of the left postcentral gyrus. Remote lacunar infarcts of the left thalamus are again noted. White matter changes extend into the brainstem. Vascular: Flow is present in the major intracranial arteries. Skull and upper cervical spine: The craniocervical junction is normal. Upper cervical spine is within normal limits. Marrow signal is unremarkable. Sinuses/Orbits: The paranasal sinuses and mastoid air cells are clear. Right lens replacement is present. Globes and orbits are otherwise within normal limits. IMPRESSION: 1. Scattered foci of acute/subacute infarction over the left frontal and parietal convexity. This may represent left-sided emboli or a watershed infarct. 2. The largest infarct involves the left postcentral gyrus, likely accounting for the right hand numbness. 3. Focal petechial hemorrhage associated with the infarct of the left postcentral gyrus. 4. Remote lacunar infarcts of the centrum semiovale bilaterally and left thalamus. 5. Underlying small vessel ischemic disease. These results were called by telephone at the time of interpretation on 09/22/2023 at 5:18 pm to provider [facility] , who verbally acknowledged these results. Electronically Signed   By: Marin Roberts M.D.   On: 09/22/2023 17:18       Time spent: 50 min    Sunnie Nielsen, DO [facility] 09/25/2023, 12:15 PM    Dictation software may have been used to generate the above note. Typos may occur and escape review in typed/dictated notes. Please contact Dr Lyn Hollingshead directly for clarity if needed.  Staff may message me via secure chat in Epic  but this may not receive an immediate response,  please page me for urgent matters!  If 7PM-7AM, please contact night coverage [email or web address]

## 2023-09-25 NOTE — Progress Notes (Signed)
Progress Note    09/25/2023 9:29 AM * No surgery found *  Subjective:  [name] is a 3 male with history of hyperlipidemia, possible history of TIA, insulin-dependent diabetes melitis type II, hypertension, history of acute cholecystitis, who presents emergency department for chief concerns of right hand numbness for 2 to 3 weeks. It has gradually worsened, prompting him to present to the emergency department for further evaluation as he is supposed to have cholecystectomy next week. He wanted to figure this out before the surgery.    Patient was admitted to the hospitalist service for concern of CVA and upon workup was noted to have carotid disease bilaterally.  Left greater than right.  He also underwent a cardiac echocardiogram which revealed worsening ejection fraction of 20 to 25% and grade 2 diastolic dysfunction with global hypokinesis.  Patient has a history of quadruple bypass from 2017 and at that time postsurgery his EF was 45%.  Vascular surgery was consulted to evaluate patient's carotid stenosis in the setting of CVA symptoms to his right hand.  Due to patient's cardiac history he will need cardiac clearance before any intervention.   Vitals:   09/25/23 0357 09/25/23 0906  BP: 108/70 108/74  Pulse: 81 87  Resp: 16 19  Temp: 97.6 F (36.4 C) (!) 97.5 F (36.4 C)  SpO2: 96% 100%   Physical Exam: Cardiac:  RRR, Normal S1, S2. No murmurs Lungs:  Clear on auscultation. Non labored breathing on room air.  Incisions:  None  Extremities:  Warm to touch with palpable pulses throughout. Numbness to his fingers on the right. Full ROM.  Abdomen:  Positive bowel sounds throughout, soft, non tender and non distended.  Neurologic: AAOX4 and follows commands appropriately  CBC    Component Value Date/Time   WBC 11.2 (H) 09/22/2023 1558   RBC 4.42 09/22/2023 1558   HGB 13.0 09/22/2023 1558   HCT 40.2 09/22/2023 1558   PLT 323 09/22/2023 1558   MCV 91.0 09/22/2023 1558    MCH 29.4 09/22/2023 1558   MCHC 32.3 09/22/2023 1558   RDW 15.0 09/22/2023 1558   LYMPHSABS 2.0 09/22/2023 1558   MONOABS 0.8 09/22/2023 1558   EOSABS 0.2 09/22/2023 1558   BASOSABS 0.0 09/22/2023 1558    BMET    Component Value Date/Time   NA 136 09/25/2023 0552   K 3.7 09/25/2023 0552   CL 103 09/25/2023 0552   CO2 24 09/25/2023 0552   GLUCOSE 118 (H) 09/25/2023 0552   BUN 20 09/25/2023 0552   CREATININE 0.59 (L) 09/25/2023 0552   CALCIUM 8.6 (L) 09/25/2023 0552   GFRNONAA >60 09/25/2023 0552   GFRAA >60 03/17/2019 1306    INR    Component Value Date/Time   INR 1.2 07/28/2023 1554    No intake or output data in the 24 hours ending 09/25/23 3235   Assessment/Plan:  72 y.o. male presents to [facility] emergency room with worsening right hand numbness and weakness over the past 2-3 weeks. Upon work up patient was noted to have bilateral carotid stenosis and cardiomyopathy with an EF of 20-25 percent. Patient also is scheduled for Cholecystectomy on September 28, 2023. Patient wants to address his hand numbness and weakness prior to gallbladder removal.      I had a long discussion with the patient and his wife at the bedside this morning regarding the patient's overall health with current neurologic symptoms he is having.  Patient does need an intervention regarding his left carotid stenosis.  Whether or not he undergoes endovascular angioplasty with stent placement versus open carotid endarterectomy will have to be discussed with Dr. Aleen Campi who plans on a cholecystectomy later this week.  Currently it may be better for [name] to undergo an open carotid endarterectomy versus endovascular angioplasty with stent placement.  Endovascular angioplasty with stent placement will require the patient to be on aspirin and plavix for a minimum of 4 to 6 months therefore delaying any cholecystectomy.  However if the patient underwent open left carotid endarterectomy he would not be committed to  dual antiplatelet therapy postoperatively long-term and thus undergo cholecystectomy shortly after recovering from open carotid endarterectomy.   However [name] will also need to have cardiac clearance for both surgical procedures. His history includes CABG X 5 vessels back in 2017 with an ejection fraction of 35% post op and a current ejection fraction of 20-25%.   Therefore at this time we will wait to hear from Dr Aleen Campi to see if a cholecystectomy can be delayed and if so for how long. We will need to wait for cardiac clearance as I suspect with the patient's declining ejection fraction he may need further cardiac workup prior to clearance for carotid surgery and a cholecystectomy.  I informed [name] and his wife once all parties had evaluated his current situation and the plan had been developed we would come speak with them accordingly.  Both Mr. and Alexander [name] verbalized they want to address his cardiac status and his neurologic status via his carotid stenosis prior to any intervention for his gallbladder.  I discussed this case in detail with Dr. [name] MD and he agrees with the plan.   DVT prophylaxis: ASA 81 mg daily, Plavix 75 mg daily, Lovenox injection 50 mg every 24 hours.   Marcie [facility] 09/25/2023 9:29 AM

## 2023-09-26 ENCOUNTER — Observation Stay: Payer: Medicare Other

## 2023-09-26 ENCOUNTER — Inpatient Hospital Stay: Payer: Medicare Other

## 2023-09-26 DIAGNOSIS — I5022 Chronic systolic (congestive) heart failure: Secondary | ICD-10-CM | POA: Diagnosis present

## 2023-09-26 DIAGNOSIS — Z79899 Other long term (current) drug therapy: Secondary | ICD-10-CM | POA: Diagnosis not present

## 2023-09-26 DIAGNOSIS — I618 Other nontraumatic intracerebral hemorrhage: Secondary | ICD-10-CM | POA: Diagnosis present

## 2023-09-26 DIAGNOSIS — E669 Obesity, unspecified: Secondary | ICD-10-CM | POA: Diagnosis present

## 2023-09-26 DIAGNOSIS — E1165 Type 2 diabetes mellitus with hyperglycemia: Secondary | ICD-10-CM | POA: Diagnosis present

## 2023-09-26 DIAGNOSIS — Z794 Long term (current) use of insulin: Secondary | ICD-10-CM | POA: Diagnosis not present

## 2023-09-26 DIAGNOSIS — I700 Atherosclerosis of aorta: Secondary | ICD-10-CM | POA: Diagnosis present

## 2023-09-26 DIAGNOSIS — R296 Repeated falls: Secondary | ICD-10-CM | POA: Diagnosis present

## 2023-09-26 DIAGNOSIS — I255 Ischemic cardiomyopathy: Secondary | ICD-10-CM | POA: Diagnosis present

## 2023-09-26 DIAGNOSIS — I2510 Atherosclerotic heart disease of native coronary artery without angina pectoris: Secondary | ICD-10-CM | POA: Diagnosis present

## 2023-09-26 DIAGNOSIS — I639 Cerebral infarction, unspecified: Secondary | ICD-10-CM | POA: Diagnosis not present

## 2023-09-26 DIAGNOSIS — R29705 NIHSS score 5: Secondary | ICD-10-CM | POA: Diagnosis present

## 2023-09-26 DIAGNOSIS — I420 Dilated cardiomyopathy: Secondary | ICD-10-CM | POA: Diagnosis present

## 2023-09-26 DIAGNOSIS — Z7984 Long term (current) use of oral hypoglycemic drugs: Secondary | ICD-10-CM | POA: Diagnosis not present

## 2023-09-26 DIAGNOSIS — I6930 Unspecified sequelae of cerebral infarction: Secondary | ICD-10-CM | POA: Diagnosis not present

## 2023-09-26 DIAGNOSIS — I110 Hypertensive heart disease with heart failure: Secondary | ICD-10-CM | POA: Diagnosis present

## 2023-09-26 DIAGNOSIS — K8000 Calculus of gallbladder with acute cholecystitis without obstruction: Secondary | ICD-10-CM | POA: Diagnosis present

## 2023-09-26 DIAGNOSIS — Z1611 Resistance to penicillins: Secondary | ICD-10-CM | POA: Diagnosis present

## 2023-09-26 DIAGNOSIS — R2990 Unspecified symptoms and signs involving the nervous system: Secondary | ICD-10-CM | POA: Diagnosis not present

## 2023-09-26 DIAGNOSIS — Z8249 Family history of ischemic heart disease and other diseases of the circulatory system: Secondary | ICD-10-CM | POA: Diagnosis not present

## 2023-09-26 DIAGNOSIS — N390 Urinary tract infection, site not specified: Secondary | ICD-10-CM | POA: Diagnosis present

## 2023-09-26 DIAGNOSIS — I63232 Cerebral infarction due to unspecified occlusion or stenosis of left carotid arteries: Secondary | ICD-10-CM | POA: Diagnosis present

## 2023-09-26 DIAGNOSIS — Z951 Presence of aortocoronary bypass graft: Secondary | ICD-10-CM | POA: Diagnosis not present

## 2023-09-26 DIAGNOSIS — E1142 Type 2 diabetes mellitus with diabetic polyneuropathy: Secondary | ICD-10-CM | POA: Diagnosis present

## 2023-09-26 DIAGNOSIS — E872 Acidosis, unspecified: Secondary | ICD-10-CM | POA: Diagnosis present

## 2023-09-26 DIAGNOSIS — R200 Anesthesia of skin: Secondary | ICD-10-CM | POA: Diagnosis present

## 2023-09-26 DIAGNOSIS — E7800 Pure hypercholesterolemia, unspecified: Secondary | ICD-10-CM | POA: Diagnosis present

## 2023-09-26 DIAGNOSIS — I6523 Occlusion and stenosis of bilateral carotid arteries: Secondary | ICD-10-CM | POA: Diagnosis not present

## 2023-09-26 LAB — GLUCOSE, CAPILLARY
Glucose-Capillary: 118 mg/dL — ABNORMAL HIGH (ref 70–99)
Glucose-Capillary: 130 mg/dL — ABNORMAL HIGH (ref 70–99)
Glucose-Capillary: 195 mg/dL — ABNORMAL HIGH (ref 70–99)
Glucose-Capillary: 206 mg/dL — ABNORMAL HIGH (ref 70–99)
Glucose-Capillary: 189 mg/dL — ABNORMAL HIGH (ref 70–99)

## 2023-09-26 LAB — NM MYOCAR MULTI W/SPECT W/WALL MOTION / EF
Estimated workload: 1
Exercise duration (min): 1 min
LV dias vol: 240 mL (ref 62–150)
LV sys vol: 187 mL
MPHR: 149 {beats}/min
Nuc Stress EF: 22 %
Peak HR: 96 {beats}/min
Percent HR: 64 %
Rest HR: 90 {beats}/min
Rest Nuclear Isotope Dose: 10.6 mCi
SDS: 1
SRS: 7
SSS: 1
Stress Nuclear Isotope Dose: 32 mCi
TID: 1.23

## 2023-09-26 LAB — BASIC METABOLIC PANEL
Anion gap: 7 (ref 5–15)
BUN: 21 mg/dL (ref 8–23)
CO2: 25 mmol/L (ref 22–32)
Calcium: 8.4 mg/dL — ABNORMAL LOW (ref 8.9–10.3)
Chloride: 103 mmol/L (ref 98–111)
Creatinine, Ser: 0.57 mg/dL — ABNORMAL LOW (ref 0.61–1.24)
GFR, Estimated: >60 mL/min (ref >60)
Glucose, Bld: 120 mg/dL — ABNORMAL HIGH (ref 70–99)
Potassium: 3.7 mmol/L (ref 3.5–5.1)
Sodium: 135 mmol/L (ref 135–145)

## 2023-09-26 LAB — MAGNESIUM: Magnesium: 2.1 mg/dL (ref 1.7–2.4)

## 2023-09-26 MED ORDER — REGADENOSON 0.4 MG/5ML IV SOLN
0.4000 mg | Freq: Once | INTRAVENOUS | Status: AC
  Administered 2023-09-26: 0.4 mg via INTRAVENOUS

## 2023-09-26 MED ORDER — CARVEDILOL 3.125 MG PO TABS
1.6000 mg | ORAL_TABLET | Freq: Two times a day (BID) | ORAL | Status: DC
  Administered 2023-09-26 – 2023-09-29 (×5): 1.5625 mg via ORAL
  Filled 2023-09-26 (×5): qty 1

## 2023-09-26 MED ORDER — POLYETHYLENE GLYCOL 3350 17 G PO PACK: 17.0000 g | PACK | Freq: Two times a day (BID) | ORAL | Status: DC

## 2023-09-26 MED ORDER — BISACODYL 10 MG RE SUPP: 10.0000 mg | Freq: Every day | RECTAL | Status: DC | PRN

## 2023-09-26 MED ORDER — SENNOSIDES-DOCUSATE SODIUM 8.6-50 MG PO TABS: 2.0000 | ORAL_TABLET | Freq: Every day | ORAL | Status: DC

## 2023-09-26 MED ORDER — TECHNETIUM TC 99M TETROFOSMIN IV KIT
31.9700 | PACK | Freq: Once | INTRAVENOUS | Status: AC | PRN
Start: 2023-09-26 — End: 2023-09-26
  Administered 2023-09-26: 31.97 via INTRAVENOUS

## 2023-09-26 MED ORDER — TECHNETIUM TC 99M TETROFOSMIN IV KIT
10.0000 | PACK | Freq: Once | INTRAVENOUS | Status: AC | PRN
  Administered 2023-09-26: 10.63 via INTRAVENOUS

## 2023-09-26 NOTE — H&P (View-Only) (Signed)
Progress Note    09/26/2023 10:40 AM * No surgery found *  Subjective:  [name] is a 62 male with history of hyperlipidemia, possible history of TIA, insulin-dependent diabetes melitis type II, hypertension, history of acute cholecystitis, who presents emergency department for chief concerns of right hand numbness for 2 to 3 weeks. It has gradually worsened, prompting him to present to the emergency department for further evaluation as he is supposed to have cholecystectomy next week. He wanted to figure this out before the surgery.   On exam today the patient is resting comfortably in bed.  He is now post nuclear stress test which he passed.  Patient has been cleared for any type of procedure or surgery by vascular surgery today.  Dr. Aleen Campi was contacted about his cholelithiasis and drain placement.  There is no urgent need for gallbladder removal at this time.  Patient can remain with drain and have exchanged as needed.  Drain was just previously changed out by interventional radiology.    I discussed Dr. Adelene Idler position on his gallbladder and discussed his being cleared by cardiology for vascular procedure for open surgery.  Patient endorses having a stent placed versus an open surgery for quicker recovery and discharged from the hospital.  Both the patient and his wife are concerned about the use of general anesthesia knowing that his ejection fraction is 20 to 25%.  They both feel stent placement endovascularly would be less of a risk and safer for the patient.  I informed both that I would make Dr. [name] aware of their wishes.   Vitals:   09/26/23 0514 09/26/23 0759  BP: 121/77 106/68  Pulse: 91 80  Resp: 18 15  Temp: 98.3 F (36.8 C) (!) 97.4 F (36.3 C)  SpO2: 100% 99%   Physical Exam: Cardiac:  RRR, Normal S1, S2. No murmurs Lungs:  Clear on auscultation. Non labored breathing on room air.  Incisions:  None  Extremities:  Warm to touch with palpable  pulses throughout. Numbness to his fingers on the right. Full ROM.  Abdomen:  Positive bowel sounds throughout, soft, non tender and non distended.  Neurologic: AAOX4 and follows commands appropriately  CBC    Component Value Date/Time   WBC 11.2 (H) 09/22/2023 1558   RBC 4.42 09/22/2023 1558   HGB 13.0 09/22/2023 1558   HCT 40.2 09/22/2023 1558   PLT 323 09/22/2023 1558   MCV 91.0 09/22/2023 1558   MCH 29.4 09/22/2023 1558   MCHC 32.3 09/22/2023 1558   RDW 15.0 09/22/2023 1558   LYMPHSABS 2.0 09/22/2023 1558   MONOABS 0.8 09/22/2023 1558   EOSABS 0.2 09/22/2023 1558   BASOSABS 0.0 09/22/2023 1558    BMET    Component Value Date/Time   NA 135 09/26/2023 0421   K 3.7 09/26/2023 0421   CL 103 09/26/2023 0421   CO2 25 09/26/2023 0421   GLUCOSE 120 (H) 09/26/2023 0421   BUN 21 09/26/2023 0421   CREATININE 0.57 (L) 09/26/2023 0421   CALCIUM 8.4 (L) 09/26/2023 0421   GFRNONAA >60 09/26/2023 0421   GFRAA >60 03/17/2019 1306    INR    Component Value Date/Time   INR 1.2 07/28/2023 1554    No intake or output data in the 24 hours ending 09/26/23 1040   Assessment/Plan:  72 y.o. male who presents to [facility] emergency room with worsening right hand numbness and weakness of the past 2 to 3 weeks.  Upon workup patient was noted  to have bilateral carotid stenosis left greater than right with cardiomyopathy and ejection fraction of 20 to 25%.  He has now been cleared by cardiology to undergo endovascular stent placement or open carotid endarterectomy.* No surgery found *   PLAN: I had another discussion again today with the patient and his wife at the bedside.  Both would like to proceed with endovascular stent placement due to the increased risk of surgery needing general anesthesia knowing his ejection fraction is 20 to 25%.  They both understand he was cleared by cardiology to have either procedure or surgery.  I informed Dr. [name] MD of the patient's wishes.  He plans  on speaking with the patient later today to finalize a plan for his carotid stenosis on the left side.  DVT prophylaxis:  ASA 81 mg daily, Plavix 75 mg daily, Lovenox injection 50 mg every 24 hours    [facility]ie [facility] 09/26/2023 10:40 AM

## 2023-09-26 NOTE — Progress Notes (Signed)
2022: Tele stroke cart activated at this time. Pt in CT. Bedside staff state that patient is admitted for stroke work up, and has never had resolved symptoms. States at 1700 noticed worsening symptoms.   2027: Spoke with Larkin Ina , NP. Cancelling code stroke at this time. LKW greater than 24 hours. Offered STAT consult with TSP. NP denied need at this time. States patient can be seen by neurology tomorrow AM.

## 2023-09-26 NOTE — Significant Event (Signed)
Rapid Response Event Note   Reason for Call :  Code stroke  Initial Focused Assessment:  Rapid response RN arrived at bedside with patient lying in bed surrounded by his visitor and 1C staff (both dayshift and nightshift RNs). Code stroke called as patient had worsening neuro symptoms with his NIH increasing from a 2 to a 5 on shift handoff assessment. New deficits with gaze and visual fields as well as new ataxia, and progressed weakness of right side resulting in a lean to that side with patient unable to sit straight up. 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  Patient admitted with a stroke and had fall earlier today with dizziness. Did not hit his head per staff and patient. Exam upon rapid response RN arrival notable for right sided drift, right sided ataxia in upper extremity, and partial hemianopia. CBG 206 at 19:45 and BP at that time 108/71. Hospitalist NP arrived shortly after this RN and personally performed an assessment at bedside of patient as well.  Interventions:  Helped escort patient to and from CT. In CT activated code stroke cart. After review of symptoms and history with Code Stroke RN and her consultation with hospitalist NP, code stroke canceled at this time by tele neuro and NP.  Plan of Care:  Patient to remain on 1C for now. NP to review head CT results and will consider stat tele neuro consult if needed. 1C staff to reach back out to rapid response team if any further needs.  Event Summary:   MD Notified: Larkin Ina NP Call Time: 20:01 Arrival Time: 20:03 End Time: 20:36  Bennie Dallas, RN

## 2023-09-26 NOTE — Progress Notes (Signed)
Progress Note    09/26/2023 10:40 AM * No surgery found *  Subjective:  [name] is a 62 male with history of hyperlipidemia, possible history of TIA, insulin-dependent diabetes melitis type II, hypertension, history of acute cholecystitis, who presents emergency department for chief concerns of right hand numbness for 2 to 3 weeks. It has gradually worsened, prompting him to present to the emergency department for further evaluation as he is supposed to have cholecystectomy next week. He wanted to figure this out before the surgery.   On exam today the patient is resting comfortably in bed.  He is now post nuclear stress test which he passed.  Patient has been cleared for any type of procedure or surgery by vascular surgery today.  Dr. Aleen Campi was contacted about his cholelithiasis and drain placement.  There is no urgent need for gallbladder removal at this time.  Patient can remain with drain and have exchanged as needed.  Drain was just previously changed out by interventional radiology.    I discussed Dr. Adelene Idler position on his gallbladder and discussed his being cleared by cardiology for vascular procedure for open surgery.  Patient endorses having a stent placed versus an open surgery for quicker recovery and discharged from the hospital.  Both the patient and his wife are concerned about the use of general anesthesia knowing that his ejection fraction is 20 to 25%.  They both feel stent placement endovascularly would be less of a risk and safer for the patient.  I informed both that I would make Dr. [name] aware of their wishes.   Vitals:   09/26/23 0514 09/26/23 0759  BP: 121/77 106/68  Pulse: 91 80  Resp: 18 15  Temp: 98.3 F (36.8 C) (!) 97.4 F (36.3 C)  SpO2: 100% 99%   Physical Exam: Cardiac:  RRR, Normal S1, S2. No murmurs Lungs:  Clear on auscultation. Non labored breathing on room air.  Incisions:  None  Extremities:  Warm to touch with palpable  pulses throughout. Numbness to his fingers on the right. Full ROM.  Abdomen:  Positive bowel sounds throughout, soft, non tender and non distended.  Neurologic: AAOX4 and follows commands appropriately  CBC    Component Value Date/Time   WBC 11.2 (H) 09/22/2023 1558   RBC 4.42 09/22/2023 1558   HGB 13.0 09/22/2023 1558   HCT 40.2 09/22/2023 1558   PLT 323 09/22/2023 1558   MCV 91.0 09/22/2023 1558   MCH 29.4 09/22/2023 1558   MCHC 32.3 09/22/2023 1558   RDW 15.0 09/22/2023 1558   LYMPHSABS 2.0 09/22/2023 1558   MONOABS 0.8 09/22/2023 1558   EOSABS 0.2 09/22/2023 1558   BASOSABS 0.0 09/22/2023 1558    BMET    Component Value Date/Time   NA 135 09/26/2023 0421   K 3.7 09/26/2023 0421   CL 103 09/26/2023 0421   CO2 25 09/26/2023 0421   GLUCOSE 120 (H) 09/26/2023 0421   BUN 21 09/26/2023 0421   CREATININE 0.57 (L) 09/26/2023 0421   CALCIUM 8.4 (L) 09/26/2023 0421   GFRNONAA >60 09/26/2023 0421   GFRAA >60 03/17/2019 1306    INR    Component Value Date/Time   INR 1.2 07/28/2023 1554    No intake or output data in the 24 hours ending 09/26/23 1040   Assessment/Plan:  72 y.o. male who presents to [facility] emergency room with worsening right hand numbness and weakness of the past 2 to 3 weeks.  Upon workup patient was noted  to have bilateral carotid stenosis left greater than right with cardiomyopathy and ejection fraction of 20 to 25%.  He has now been cleared by cardiology to undergo endovascular stent placement or open carotid endarterectomy.* No surgery found *   PLAN: I had another discussion again today with the patient and his wife at the bedside.  Both would like to proceed with endovascular stent placement due to the increased risk of surgery needing general anesthesia knowing his ejection fraction is 20 to 25%.  They both understand he was cleared by cardiology to have either procedure or surgery.  I informed Dr. [name] MD of the patient's wishes.  He plans  on speaking with the patient later today to finalize a plan for his carotid stenosis on the left side.  DVT prophylaxis:  ASA 81 mg daily, Plavix 75 mg daily, Lovenox injection 50 mg every 24 hours    Marcie [facility] 09/26/2023 10:40 AM

## 2023-09-26 NOTE — Progress Notes (Signed)
PROGRESS NOTE    Alexander [name]   EPP:[id number] DOB: [birth year]  DOA: 09/22/2023 Date of Service: 09/26/23 which is hospital day 0  PCP: Myrene Buddy, NP    HPI: Alexander [name] is a 72 male with history of hyperlipidemia, possible history of TIA, insulin-dependent diabetes melitis type II, hypertension, history of acute cholecystitis, who presents emergency department for chief concerns of right hand numbness for 2 to 3 weeks. It has gradually worsened, prompting him to present to the emergency department for further evaluation as he is supposed to have cholecystectomy next week. He wanted to figure this out before the surgery.   Hospital course / significant events:  11/29: admitted to hospitalist service for concern for CVA 11/30: (+)Carotid disease on CTA 12/01: Vascular consult - possible for carotid endarterectomy, will discuss tomorrow (today is Sunday). Echo also showing worsening EF 20-25% and grade 2 diastolic dysfunction w/ global hypokinesis - will need cardiology clearance prior to anesthesia 12/02: vascular planning intervention on carotid stenosis pending cardiology eval and gen surg recs re: chole 12/03: stress test today no concerns which would preclude surgery.   Consultants:  Neurology Vascular surgery  Cardiology  General Surgery   Procedures/Surgeries: none      ASSESSMENT & PLAN:    Stroke Left frontal and parietal convexity with scattered foci, concerning for left-sided emboli or watershed infarct. Largest infarct involving the left postcentral gyrus, can account for the right hand numbness. Was on ASA daily prior to this event  Plavix 75 mg daily + aspirin 81 mg daily    Carotid artery stenosis vascular planning intervention on carotid stenosis, stent vs endarterectomy    Chronic HFrEF Ischemic cardiomyopathy  Echo showing worsening EF but does not appear to be in exacerbation at this time  will need cardiology clearance prior to  anesthesia Diuresis as needed Cardiology planning stress test today --> no concerns, (+)old ischemia, clear to proceed w/ surgery     Acute cholecystitis Drain is in place General surgery consult - carotid intervention takes priority, continue drain care and plan drain exchange  Elective surgery initially planned for this week has been postponed    Metabolic acidosis - resolved Sodium bicarbonate 650 mg p.o. twice daily, 2 doses ordered on admission Monitor BMP    Hypertension Home Coreg 1.6 mg p.o. twice daily with meals resumed   Insulin dependent type 2 diabetes mellitus (HCC) A1c was 6.7 on 07/29/23 Insulin SSI with at bedtime coverage ordered   Leukocytosis Mild leukocytosis, suspect reactive in setting of stroke Monitor periodic CBC   Hyperlipidemia Atorvastatin 80 mg daily   obese based on BMI: Body mass index is 30.2 kg/m.  Underweight - under 18.5  normal weight - 18.5 to 24.9 overweight - 25 to 29.9 obese - 30 or more   DVT prophylaxis: lovenox  IV fluids: no continuous IV fluids  Nutrition: cardiac diet, will make NPO at midnight in case possibble procedure tomorrow  Central lines / invasive devices: biliary drain  Code Status: FULL CODE ACP documentation reviewed:  none on file in [facility]  TOC needs: TBD Barriers to dispo / significant pending items: vascular intervention              Subjective / Brief ROS:  Patient reports doing well this morning  No concerns  Denies CP/SOB.  Pain controlled.  Denies new weakness.  Tolerating diet.  Reports no concerns w/ urination/defecation.   Family Communication: none at this time     Objective  Findings:  Vitals:   09/25/23 2111 09/26/23 0041 09/26/23 0514 09/26/23 0759  BP: 107/63 114/66 121/77 106/68  Pulse: 85 88 91 80  Resp: 17 17 18 15   Temp: 98.2 F (36.8 C) 98.2 F (36.8 C) 98.3 F (36.8 C) (!) 97.4 F (36.3 C)  TempSrc:      SpO2: 100% 97% 100% 99%  Weight:      Height:        No intake or output data in the 24 hours ending 09/26/23 1346  Filed Weights   09/22/23 1433 09/22/23 1839 09/23/23 2023  Weight: 101.6 kg 101.6 kg 101 kg    Examination:  Physical Exam Constitutional:      General: He is not in acute distress.    Appearance: Normal appearance.  Cardiovascular:     Rate and Rhythm: Normal rate and regular rhythm.  Pulmonary:     Effort: Pulmonary effort is normal.     Breath sounds: Normal breath sounds.  Musculoskeletal:     Right lower leg: No edema.     Left lower leg: No edema.  Skin:    General: Skin is warm and dry.  Neurological:     Mental Status: He is alert and oriented to person, place, and time.  Psychiatric:        Mood and Affect: Mood normal.        Behavior: Behavior normal.          Scheduled Medications:   aspirin EC  81 mg Oral Daily   atorvastatin  80 mg Oral QHS   carvedilol  1.5625 mg Oral BID WC   clopidogrel  75 mg Oral Daily   enoxaparin (LOVENOX) injection  0.5 mg/kg Subcutaneous Q24H   insulin aspart  0-15 Units Subcutaneous TID WC   insulin aspart  0-5 Units Subcutaneous QHS   insulin glargine-yfgn  20 Units Subcutaneous QHS   sodium chloride flush  5 mL Intracatheter Daily    Continuous Infusions:   PRN Medications:  acetaminophen **OR** acetaminophen (TYLENOL) oral liquid 160 mg/5 mL **OR** acetaminophen, bisacodyl, hydrALAZINE, senna-docusate  Antimicrobials from admission:  Anti-infectives (From admission, onward)    None           Data Reviewed:  I have personally reviewed the following...  CBC: Recent Labs  Lab 09/22/23 1558  WBC 11.2*  NEUTROABS 8.1*  HGB 13.0  HCT 40.2  MCV 91.0  PLT 323   Basic Metabolic Panel: Recent Labs  Lab 09/22/23 1558 09/24/23 0407 09/25/23 0552 09/26/23 0421  NA 134* 138 136 135  K 4.1 3.6 3.7 3.7  CL 103 105 103 103  CO2 18* 22 24 25   GLUCOSE 125* 167* 118* 120*  BUN 23 20 20 21   CREATININE 0.58* 0.49* 0.59* 0.57*  CALCIUM  8.4* 8.9 8.6* 8.4*  MG  --  2.1 2.0 2.1   GFR: Estimated Creatinine Clearance: 104.2 mL/min (A) (by C-G formula based on SCr of 0.57 mg/dL (L)). Liver Function Tests: Recent Labs  Lab 09/22/23 1558  AST 25  ALT 33  ALKPHOS 96  BILITOT 0.8  PROT 7.2  ALBUMIN 3.4*   No results for input(s): "LIPASE", "AMYLASE" in the last 168 hours. No results for input(s): "AMMONIA" in the last 168 hours. Coagulation Profile: No results for input(s): "INR", "PROTIME" in the last 168 hours. Cardiac Enzymes: No results for input(s): "CKTOTAL", "CKMB", "CKMBINDEX", "TROPONINI" in the last 168 hours. BNP (last 3 results) No results for input(s): "PROBNP" in the last 8760  hours. HbA1C: No results for input(s): "HGBA1C" in the last 72 hours. CBG: Recent Labs  Lab 09/25/23 1228 09/25/23 1658 09/25/23 2118 09/26/23 0800 09/26/23 1202  GLUCAP 138* 195* 149* 118* 130*   Lipid Profile: No results for input(s): "CHOL", "HDL", "LDLCALC", "TRIG", "CHOLHDL", "LDLDIRECT" in the last 72 hours.  Thyroid Function Tests: No results for input(s): "TSH", "T4TOTAL", "FREET4", "T3FREE", "THYROIDAB" in the last 72 hours. Anemia Panel: No results for input(s): "VITAMINB12", "FOLATE", "FERRITIN", "TIBC", "IRON", "RETICCTPCT" in the last 72 hours. Most Recent Urinalysis On File:     Component Value Date/Time   COLORURINE YELLOW (A) 09/22/2023 1641   APPEARANCEUR HAZY (A) 09/22/2023 1641   LABSPEC 1.035 (H) 09/22/2023 1641   PHURINE 5.0 09/22/2023 1641   GLUCOSEU >=500 (A) 09/22/2023 1641   HGBUR NEGATIVE 09/22/2023 1641   BILIRUBINUR NEGATIVE 09/22/2023 1641   KETONESUR 5 (A) 09/22/2023 1641   PROTEINUR NEGATIVE 09/22/2023 1641   NITRITE NEGATIVE 09/22/2023 1641   LEUKOCYTESUR NEGATIVE 09/22/2023 1641   Sepsis Labs: @LABRCNTIP (procalcitonin:4,lacticidven:4) Microbiology: No results found for this or any previous visit (from the past 240 hour(s)).    Radiology Studies last 3 days: NM Myocar Multi  W/Spect W/Wall Motion / EF  Result Date: 09/26/2023   Findings are consistent with no ischemia. The study is intermediate risk.   LV perfusion is normal. There is no evidence of ischemia. There is evidence of old apical infarction.   Left ventricular function is abnormal. Global function is severely reduced. There were no regional wall motion abnormalities. Nuclear stress EF: 22%. The left ventricular ejection fraction is severely decreased (<30%). End diastolic cavity size is severely enlarged. End systolic cavity size is severely enlarged.   ECHOCARDIOGRAM COMPLETE BUBBLE STUDY  Result Date: 09/24/2023    ECHOCARDIOGRAM REPORT   Patient Name:   Alexander [name] Date of Exam: 09/23/2023 Medical Rec #:  [id number]     Height:       72.0 in Accession #:    [id number]    Weight:       224.0 lb Date of Birth:  [birth year]     BSA:          2.236 m Patient Age:    72 years      BP:           131/78 mmHg Patient Gender: M             HR:           93 bpm. Exam Location:  [facility] Procedure: 2D Echo, Cardiac Doppler, Color Doppler, Saline Contrast Bubble Study            and Intracardiac Opacification Agent Indications:     Stroke 434.91 / I63.9  History:         Patient has no prior history of Echocardiogram examinations.                  Cardiomyopathy and CHF, CAD, Prior CABG, Stroke; Risk                  Factors:Hypertension, Diabetes and Dyslipidemia.  Sonographer:     Lucendia Herrlich RCS Referring Phys:  [id number] AMY N COX Diagnosing Phys: Alwyn Pea MD IMPRESSIONS  1. Left ventricular ejection fraction, by estimation, is 20 to 25%. The left ventricle has severely decreased function. The left ventricle demonstrates global hypokinesis. Left ventricular diastolic parameters are consistent with Grade II diastolic dysfunction (pseudonormalization).  2. Right ventricular systolic function is moderately reduced.  The right ventricular size is mildly enlarged.  3. Left atrial size was mildly dilated.  4. The mitral  valve is normal in structure. Mild mitral valve regurgitation.  5. The aortic valve is normal in structure. Aortic valve regurgitation is trivial. Aortic valve sclerosis is present, with no evidence of aortic valve stenosis. Conclusion(s)/Recommendation(s): No evidence of valvular vegetations on this transthoracic echocardiogram. Consider a transesophageal echocardiogram to exclude infective endocarditis if clinically indicated. No intracardiac source of embolism detected on  this transthoracic study. Consider a transesophageal echocardiogram to exclude cardiac source of embolism if clinically indicated. No left ventricular mural or apical thrombus/thrombi. FINDINGS  Left Ventricle: Left ventricular ejection fraction, by estimation, is 20 to 25%. The left ventricle has severely decreased function. The left ventricle demonstrates global hypokinesis. Definity contrast agent was given IV to delineate the left ventricular endocardial borders. The left ventricular internal cavity size was normal in size. There is no left ventricular hypertrophy. Left ventricular diastolic parameters are consistent with Grade II diastolic dysfunction (pseudonormalization). Right Ventricle: The right ventricular size is mildly enlarged. No increase in right ventricular wall thickness. Right ventricular systolic function is moderately reduced. Left Atrium: Left atrial size was mildly dilated. Right Atrium: Right atrial size was normal in size. Pericardium: There is no evidence of pericardial effusion. Mitral Valve: The mitral valve is normal in structure. Mild mitral valve regurgitation. Tricuspid Valve: The tricuspid valve is normal in structure. Tricuspid valve regurgitation is mild. Aortic Valve: The aortic valve is normal in structure. Aortic valve regurgitation is trivial. Aortic valve sclerosis is present, with no evidence of aortic valve stenosis. Aortic valve peak gradient measures 3.4 mmHg. Pulmonic Valve: The pulmonic valve was  normal in structure. Pulmonic valve regurgitation is not visualized. Aorta: The ascending aorta was not well visualized. IAS/Shunts: No atrial level shunt detected by color flow Doppler.  LEFT VENTRICLE PLAX 2D LVIDd:         4.70 cm   Diastology LVIDs:         4.50 cm   LV e' medial:    10.20 cm/s LV PW:         1.00 cm   LV E/e' medial:  9.0 LV IVS:        1.00 cm   LV e' lateral:   10.60 cm/s LVOT diam:     2.40 cm   LV E/e' lateral: 8.7 LV SV:         61 LV SV Index:   27 LVOT Area:     4.52 cm  RIGHT VENTRICLE             IVC RV S prime:     10.60 cm/s  IVC diam: 2.20 cm TAPSE (M-mode): 1.3 cm LEFT ATRIUM             Index        RIGHT ATRIUM           Index LA diam:        4.80 cm 2.15 cm/m   RA Area:     24.30 cm LA Vol (A2C):   79.1 ml 35.38 ml/m  RA Volume:   73.45 ml  32.85 ml/m LA Vol (A4C):   80.3 ml 35.92 ml/m LA Biplane Vol: 84.6 ml 37.84 ml/m  AORTIC VALVE AV Area (Vmax): 3.84 cm AV Vmax:        92.60 cm/s AV Peak Grad:   3.4 mmHg LVOT Vmax:      78.53 cm/s LVOT Vmean:  52.033 cm/s LVOT VTI:       0.135 m  AORTA Ao Root diam: 3.83 cm Ao Asc diam:  3.50 cm MITRAL VALVE MV Area (PHT): 4.86 cm    SHUNTS MV Decel Time: 156 msec    Systemic VTI:  0.14 m MR Peak grad: 41.9 mmHg    Systemic Diam: 2.40 cm MR Vmax:      323.67 cm/s MV E velocity: 92.00 cm/s MV A velocity: 70.60 cm/s MV E/A ratio:  1.30 Alwyn Pea MD Electronically signed by Alwyn Pea MD Signature Date/Time: 09/24/2023/11:28:15 AM    Final    CT ANGIO HEAD NECK W WO CM  Result Date: 09/22/2023 CLINICAL DATA:  Follow-up examination for stroke. EXAM: CT ANGIOGRAPHY HEAD AND NECK WITH AND WITHOUT CONTRAST TECHNIQUE: Multidetector CT imaging of the head and neck was performed using the standard protocol during bolus administration of intravenous contrast. Multiplanar CT image reconstructions and MIPs were obtained to evaluate the vascular anatomy. Carotid stenosis measurements (when applicable) are obtained utilizing  NASCET criteria, using the distal internal carotid diameter as the denominator. RADIATION DOSE REDUCTION: This exam was performed according to the departmental dose-optimization program which includes automated exposure control, adjustment of the mA and/or kV according to patient size and/or use of iterative reconstruction technique. CONTRAST:  75mL OMNIPAQUE IOHEXOL 350 MG/ML SOLN COMPARISON:  MRI from earlier the same day. FINDINGS: CT HEAD FINDINGS Brain: Cerebral volume within normal limits. Mild chronic microvascular ischemic disease. Previously identified left cerebral infarcts grossly stable, better characterized on prior MRI. No associated hemorrhage or mass effect. No other acute large vessel territory infarct. No mass lesion or midline shift. No hydrocephalus or extra-axial fluid collection. Vascular: No abnormal hyperdense vessel. Skull: Scalp soft tissues demonstrate no acute finding. Calvarium intact. Sinuses/Orbits: Globes and orbital soft tissues within normal limits. Visualized paranasal sinuses and mastoid air cells are clear. Other: None. Review of the MIP images confirms the above findings CTA NECK FINDINGS Aortic arch: Visualized aortic arch within normal limits for caliber with standard 3 vessel morphology. Mild aortic atherosclerosis. No stenosis about the origin the great vessels. Right carotid system: Right common and internal carotid arteries are patent without dissection. Bulky calcified plaque about the right carotid bulb with associated stenosis of up to 60% by NASCET criteria. Left carotid system: Left common and internal carotid arteries are patent without dissection. Calcified plaque about the left carotid bulb with associated stenosis of up to 60% by NASCET criteria. Vertebral arteries: Both vertebral arteries arise from subclavian arteries. No proximal subclavian artery stenosis. Atheromatous change at the origin of the left vertebral artery with mild stenosis. Left vertebral artery  dominant, with a diffusely hypoplastic right vertebral artery. Vertebral arteries otherwise patent without stenosis or dissection. Skeleton: No discrete or worrisome osseous lesions. Mild to moderate spondylosis noted at C5-6 and C6-7. Prior sternotomy noted. Other neck: No other acute finding. Upper chest: Linear subsegmental atelectatic changes noted within the left lung. No other acute finding. Review of the MIP images confirms the above findings CTA HEAD FINDINGS Anterior circulation: Extensive atheromatous plaque throughout the carotid siphons with secondary moderate diffuse narrowing bilaterally. A1 segments patent bilaterally. Normal anterior communicating artery complex. Anterior cerebral arteries patent without significant stenosis. No M1 stenosis or occlusion. No proximal MCA branch occlusion or high-grade stenosis. Distal MCA branches perfused and symmetric. Posterior circulation: Both V4 segments patent without significant stenosis. Both PICA patent. Basilar patent without stenosis. Superior cerebellar arteries patent bilaterally. Both PCAs primarily supplied via  the basilar. Short-segment moderate right P2 stenosis (series 11, image 20). Both PCAs otherwise patent to their distal aspects without significant stenosis. Venous sinuses: Patent allowing for timing the contrast bolus. Anatomic variants: As above.  No aneurysm. Review of the MIP images confirms the above findings IMPRESSION: CT HEAD: 1. Grossly stable left cerebral infarcts, better characterized on prior MRI. No associated hemorrhage or mass effect. 2. No other acute intracranial abnormality. CTA HEAD AND NECK: 1. Negative CTA for large vessel occlusion or other emergent finding. 2. Bulky calcified plaque about the carotid bulbs with associated stenosis of up to 60% bilaterally. 3. Extensive atheromatous plaque throughout the carotid siphons with secondary moderate diffuse narrowing bilaterally. 4. Short-segment moderate right P2 stenosis. 5.   Aortic Atherosclerosis (ICD10-I70.0). Electronically Signed   By: Rise Mu M.D.   On: 09/22/2023 20:35   MR BRAIN WO CONTRAST  Result Date: 09/22/2023 CLINICAL DATA:  Right hand numbness over the last 2-3 weeks. Neuro deficit, acute, stroke suspected. EXAM: MRI HEAD WITHOUT CONTRAST TECHNIQUE: Multiplanar, multiecho pulse sequences of the brain and surrounding structures were obtained without intravenous contrast. COMPARISON:  CT head without contrast 01/15/2019. FINDINGS: Brain: Scattered foci of restricted diffusion are present over the left frontal and parietal convexity. Largest in scratched at the large cortical infarct involves the postcentral gyrus, likely accounting for the right hand numbness. Whether cortical and subcortical foci are present. T2 and FLAIR hyperintensities are associated with the areas of acute/subacute infarction. Remote lacunar infarcts are present within the centrum semiovale bilaterally. Focal petechial hemorrhage is associated with the infarct of the left postcentral gyrus. Remote lacunar infarcts of the left thalamus are again noted. White matter changes extend into the brainstem. Vascular: Flow is present in the major intracranial arteries. Skull and upper cervical spine: The craniocervical junction is normal. Upper cervical spine is within normal limits. Marrow signal is unremarkable. Sinuses/Orbits: The paranasal sinuses and mastoid air cells are clear. Right lens replacement is present. Globes and orbits are otherwise within normal limits. IMPRESSION: 1. Scattered foci of acute/subacute infarction over the left frontal and parietal convexity. This may represent left-sided emboli or a watershed infarct. 2. The largest infarct involves the left postcentral gyrus, likely accounting for the right hand numbness. 3. Focal petechial hemorrhage associated with the infarct of the left postcentral gyrus. 4. Remote lacunar infarcts of the centrum semiovale bilaterally and  left thalamus. 5. Underlying small vessel ischemic disease. These results were called by telephone at the time of interpretation on 09/22/2023 at 5:18 pm to provider [facility] , who verbally acknowledged these results. Electronically Signed   By: Marin Roberts M.D.   On: 09/22/2023 17:18           Sunnie Nielsen, DO [facility] 09/26/2023, 1:46 PM    Dictation software may have been used to generate the above note. Typos may occur and escape review in typed/dictated notes. Please contact Dr Lyn Hollingshead directly for clarity if needed.  Staff may message me via secure chat in Epic  but this may not receive an immediate response,  please page me for urgent matters!  If 7PM-7AM, please contact night coverage [email or web address]

## 2023-09-26 NOTE — Progress Notes (Signed)
       CROSS COVER NOTE  NAME: Alexander [name] MRN: [id number] DOB : [birth year]    Date of Service   09/26/2023   HPI/Events of Note   Code stroke was called to patient's room at 2001.  This NP arrived at bedside and care nurse stated that patient had fallen prior to the commencement of her shift and she also noted that patient is weaker on the right extremities than usual. Chart review showed that patient present with similar complaints and did not have any resolution of initial symptoms of weakness to the right extremity. However the care nurse and the family at bedside were concerned that patient is not able to ambulate like he was before, he fell, and he was complaining of dizziness. Bedside assessment completed: full neuroassessment and NIH.  Patient did not appear to have any new deficit.   Interventions   Plan: CT ordered for code stroke and patient went down for imaging. Radiologist called and reported that no new changes were noted in this new CT compared to the initial 1 patient had on admission. No further interventions were made. Patient being followed by neurology.  Consider reengaging them this a.m.       Hever Castilleja Lamin Geradine Girt, MSN, APRN, AGACNP-BC [facility] [place] Pager: [phone]. Check Amion for Availability

## 2023-09-26 NOTE — Plan of Care (Signed)
  Problem: Education: Goal: Knowledge of disease or condition will improve Outcome: Progressing Goal: Knowledge of secondary prevention will improve (MUST DOCUMENT ALL) Outcome: Progressing Goal: Knowledge of patient specific risk factors will improve Loraine Leriche N/A or DELETE if not current risk factor) Outcome: Progressing   Problem: Ischemic Stroke/TIA Tissue Perfusion: Goal: Complications of ischemic stroke/TIA will be minimized Outcome: Progressing   Problem: Coping: Goal: Will verbalize positive feelings about self Outcome: Progressing Goal: Will identify appropriate support needs Outcome: Progressing   Problem: Health Behavior/Discharge Planning: Goal: Ability to manage health-related needs will improve Outcome: Progressing Goal: Goals will be collaboratively established with patient/family Outcome: Progressing   Problem: Self-Care: Goal: Ability to participate in self-care as condition permits will improve Outcome: Progressing Goal: Verbalization of feelings and concerns over difficulty with self-care will improve Outcome: Progressing Goal: Ability to communicate needs accurately will improve Outcome: Progressing   Problem: Nutrition: Goal: Risk of aspiration will decrease Outcome: Progressing Goal: Dietary intake will improve Outcome: Progressing   Problem: Education: Goal: Ability to describe self-care measures that may prevent or decrease complications (Diabetes Survival Skills Education) will improve Outcome: Progressing Goal: Individualized Educational Video(s) Outcome: Progressing   Problem: Coping: Goal: Ability to adjust to condition or change in health will improve Outcome: Progressing   Problem: Fluid Volume: Goal: Ability to maintain a balanced intake and output will improve Outcome: Progressing   Problem: Health Behavior/Discharge Planning: Goal: Ability to identify and utilize available resources and services will improve Outcome:  Progressing Goal: Ability to manage health-related needs will improve Outcome: Progressing   Problem: Metabolic: Goal: Ability to maintain appropriate glucose levels will improve Outcome: Progressing   Problem: Nutritional: Goal: Maintenance of adequate nutrition will improve Outcome: Progressing Goal: Progress toward achieving an optimal weight will improve Outcome: Progressing   Problem: Skin Integrity: Goal: Risk for impaired skin integrity will decrease Outcome: Progressing   Problem: Tissue Perfusion: Goal: Adequacy of tissue perfusion will improve Outcome: Progressing   Problem: Education: Goal: Knowledge of General Education information will improve Description: Including pain rating scale, medication(s)/side effects and non-pharmacologic comfort measures Outcome: Progressing   Problem: Health Behavior/Discharge Planning: Goal: Ability to manage health-related needs will improve Outcome: Progressing   Problem: Clinical Measurements: Goal: Ability to maintain clinical measurements within normal limits will improve Outcome: Progressing Goal: Will remain free from infection Outcome: Progressing Goal: Diagnostic test results will improve Outcome: Progressing Goal: Respiratory complications will improve Outcome: Progressing Goal: Cardiovascular complication will be avoided Outcome: Progressing   Problem: Activity: Goal: Risk for activity intolerance will decrease Outcome: Progressing   Problem: Nutrition: Goal: Adequate nutrition will be maintained Outcome: Progressing   Problem: Coping: Goal: Level of anxiety will decrease Outcome: Progressing   Problem: Elimination: Goal: Will not experience complications related to bowel motility Outcome: Progressing Goal: Will not experience complications related to urinary retention Outcome: Progressing   Problem: Pain Management: Goal: General experience of comfort will improve Outcome: Progressing   Problem:  Safety: Goal: Ability to remain free from injury will improve Outcome: Progressing   Problem: Skin Integrity: Goal: Risk for impaired skin integrity will decrease Outcome: Progressing

## 2023-09-26 NOTE — Progress Notes (Signed)
Per Dr Sheppard Coil, dc tele monitoring

## 2023-09-26 NOTE — Progress Notes (Signed)
Called to bedside concern for patient weakness  Pt alert, conversational, making jokes. Denies CP/SOB, denies pain, denies nausea, reports some dizziness and generalized weakness, no focal weakness, no new vision problems. He has not eaten since dinnertime yesterday (was NPO today for stress test), examiend at about 5 pm  VSS Glc high CV: RRR, no rub/gallop L: CTAB Abd: soft, nt Neuro: equal strength extremities, no facial droop or CN deficit on limited exam, oriented person/place/situation  Generalized weakness in setting of no food intake and cardiac stress test today which pt already stated earlier made him tired. No focal findings to concern for CVA. Pt received dinner tray and is encouraged to eat and will recheck in an hour or so. Restart telemetry x12h. PT/OT to assess tomorrow   Critical care time 30 min

## 2023-09-26 NOTE — Progress Notes (Addendum)
[facility] CLINIC CARDIOLOGY PROGRESS NOTE       Patient ID: Alexander [name] MRN: [id number] DOB/AGE: 1951/01/13 72 y.o.  Admit date: 09/22/2023 Referring Physician Dr. Sunnie Nielsen Primary Physician Gauger, Hermenia Fiscal, NP  Primary Cardiologist Dr. Windell Norfolk Reason for Consultation preoperative cardiac evaluation  HPI: Alexander [name] is a 72 y.o. male  with a past medical history of coronary artery disease s/p bypass with SVG to first diagonal, LIMA from the SVG to the diagonal to the LAD, SVG to the proximal RCA and acute marginal branch of the RCA and an SVG to the OM1 in 2017 at [facility], ischemic cardiomyopathy, type 2 diabetes, and hypertension who presented to the ED on 09/22/2023 for hand numbness. MRI brain with acute/subacute infarction over the left frontal and parietal convexity, CTA head/neck with carotid stenosis. Vascular surgery is planning for L carotid intervention. Cardiology was consulted for preoperative evaluation.    Interval history: -Patient feeling well today. Denies any chest pain, SOB, palpitations, dizziness.  -He is eager to proceed with surgery.  -BP and HR remain controlled.   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)    Pneumonia 07/05/2023   admitted to [facility]   Polyneuropathy    S/P CABG x 5 2017   Sepsis (HCC) 07/05/2023   Severe nonproliferative diabetic retinopathy of left eye (HCC)    Stroke (HCC) 2014    mini, tingling right hand    Past Surgical History:  Procedure Laterality Date   CARDIAC CATHETERIZATION Left 08/09/2016   Procedure: Left Heart Cath and Coronary Angiography;  Surgeon: Dalia Heading, MD;  Location: [facility] INVASIVE CV LAB;  Service: Cardiovascular;  Laterality: Left;   CATARACT EXTRACTION W/PHACO Right 02/06/2023   Procedure: CATARACT EXTRACTION PHACO AND INTRAOCULAR LENS PLACEMENT (IOC) AHMED TUBE SHUNT W/ TUTOPLAST RIGHT DIABETIC  3.94   00:42.6;  Surgeon: Nevada Crane, MD;  Location: [facility];  Service: Ophthalmology;  Laterality: Right;   COLONOSCOPY WITH PROPOFOL N/A 02/02/2021   Procedure: COLONOSCOPY WITH PROPOFOL;  Surgeon: Regis Bill, MD;  Location: [facility] ENDOSCOPY;  Service: Endoscopy;  Laterality: N/A;   CORONARY ARTERY BYPASS GRAFT  2017   x5   IR EXCHANGE BILIARY DRAIN  09/01/2023   IR PERC CHOLECYSTOSTOMY  07/28/2023    Medications Prior to Admission  Medication Sig Dispense Refill Last Dose   acetaminophen (TYLENOL) 500 MG tablet Take 1,000 mg by mouth every 6 (six) hours as needed for mild pain or headache.   prn at prn   atorvastatin (LIPITOR) 80 MG tablet Take 1 tablet (80 mg total) by mouth daily. Hold until you see your doctor and have your liver enzymes repeated   09/22/2023 at am   carvedilol (COREG) 3.125 MG tablet Take 3.125 mg by mouth 2 (two) times daily with a meal.   09/22/2023  at am   empagliflozin (JARDIANCE) 25 MG TABS tablet Take 25 mg by mouth daily.   09/22/2023 at am   LANTUS SOLOSTAR 100 UNIT/ML Solostar Pen Inject 38 Units into the skin at bedtime.   09/21/2023 at pm   OZEMPIC, 2 MG/DOSE, 8 MG/3ML SOPN Inject 2 mg into the skin every 7 (seven) days. Sundays   Past Week at unknown   sodium chloride flush (NS) 0.9 % SOLN 5 mLs by Intracatheter route daily. 150 mL 1 unknown at unknown   aspirin EC 81 MG tablet Take 81 mg by mouth daily. (Patient not taking: Reported on 09/22/2023)   Not Taking at unknown   feeding  supplement (ENSURE ENLIVE / ENSURE PLUS) LIQD Take 237 mLs by mouth 2 (two) times daily between meals. 237 mL 12    sacubitril-valsartan (ENTRESTO) 24-26 MG Take 1 tablet by mouth every 12 (twelve) hours. (Patient not taking: Reported on 09/15/2023)   Not Taking   Social History   Socioeconomic History   Marital status: Married    Spouse name: Not on file   Number of children: Not on file   Years of education: Not on file   Highest education level: Not on file  Occupational History   Not on file  Tobacco Use   Smoking status: Never    Passive exposure: Never   Smokeless tobacco: Never  Vaping Use   Vaping status: Never Used  Substance and Sexual Activity   Alcohol use: No   Drug use: Never   Sexual activity: Yes    Partners: Female  Other Topics Concern   Not on file  Social History Narrative   Not on file   Social Determinants of Health   Financial Resource Strain: Not on file  Food Insecurity: No Food Insecurity (09/24/2023)   Hunger Vital Sign    Worried About Running Out of Food in the Last Year: Never true    Ran Out of Food in the Last Year: Never true  Transportation Needs: No Transportation Needs (09/24/2023)   PRAPARE - Administrator, Civil Service (Medical): No    Lack of Transportation (Non-Medical): No  Physical Activity: Not on file  Stress: Not on file  Social Connections: Not on file  Intimate Partner Violence: Not At Risk (09/24/2023)   Humiliation, Afraid, Rape, and Kick questionnaire    Fear of Current or Ex-Partner: No    Emotionally Abused: No    Physically Abused: No    Sexually Abused: No    Family History  Problem Relation Age of Onset   Hypertension Mother    Diabetes Mother      Vitals:   09/25/23 2111 09/26/23 0041 09/26/23 0514 09/26/23 0759  BP: 107/63 114/66 121/77 106/68  Pulse: 85 88 91 80  Resp: 17 17 18 15   Temp: 98.2 F (36.8 C) 98.2 F (36.8 C) 98.3 F (36.8 C) (!) 97.4 F (36.3 C)  TempSrc:      SpO2: 100%  97% 100% 99%  Weight:      Height:        PHYSICAL EXAM General: Well appearing elderly male, well nourished, in no acute distress sitting upright in hospital bed. HEENT: Normocephalic and atraumatic. Neck: No JVD.  Lungs: Normal respiratory effort on room air. Clear bilaterally to auscultation. No wheezes, crackles, rhonchi.  Heart: HRRR. Normal S1 and S2 without gallops or murmurs.  Abdomen: Non-distended appearing.  Msk: Normal strength and tone for age. Extremities: Warm and well perfused.  No clubbing, cyanosis. No edema.  Neuro: Alert and oriented X 3. Psych: Answers questions appropriately.   Labs: Basic Metabolic Panel: Recent Labs    09/25/23 0552 09/26/23 0421  NA 136 135  K 3.7 3.7  CL 103 103  CO2 24 25  GLUCOSE 118* 120*  BUN 20 21  CREATININE 0.59* 0.57*  CALCIUM 8.6* 8.4*  MG 2.0 2.1   Liver Function Tests: No results for input(s): "AST", "ALT", "ALKPHOS", "BILITOT", "PROT", "ALBUMIN" in the last 72 hours.  No results for input(s): "LIPASE", "AMYLASE" in the last 72 hours. CBC: No results for input(s): "WBC", "NEUTROABS", "HGB", "HCT", "MCV", "PLT" in the last 72 hours.  Cardiac Enzymes: No results for input(s): "CKTOTAL", "CKMB", "CKMBINDEX", "TROPONINIHS" in the last 72 hours. BNP: No results for input(s): "BNP" in the last 72 hours. D-Dimer: No results for input(s): "DDIMER" in the last 72 hours. Hemoglobin A1C: No results for input(s): "HGBA1C" in the last 72 hours. Fasting Lipid Panel: No results for input(s): "CHOL", "HDL", "LDLCALC", "TRIG", "CHOLHDL", "LDLDIRECT" in the last 72 hours.  Thyroid Function Tests: No results for input(s): "TSH", "T4TOTAL", "T3FREE", "THYROIDAB" in the last 72 hours.  Invalid input(s): "FREET3" Anemia Panel: No results for input(s): "VITAMINB12", "FOLATE", "FERRITIN", "TIBC", "IRON", "RETICCTPCT" in the last 72 hours.   Radiology: ECHOCARDIOGRAM COMPLETE BUBBLE STUDY  Result Date: 09/24/2023     ECHOCARDIOGRAM REPORT   Patient Name:   Alexander [name] Date of Exam: 09/23/2023 Medical Rec #:  [id number]     Height:       72.0 in Accession #:    [id number]    Weight:       224.0 lb Date of Birth:  07-Oct-1951     BSA:          2.236 m Patient Age:    72 years      BP:           131/78 mmHg Patient Gender: M             HR:           93 bpm. Exam Location:  [facility] Procedure: 2D Echo, Cardiac Doppler, Color Doppler, Saline Contrast Bubble Study            and Intracardiac Opacification Agent Indications:     Stroke 434.91 / I63.9  History:         Patient has no prior history of Echocardiogram examinations.                  Cardiomyopathy and CHF, CAD, Prior CABG, Stroke; Risk                  Factors:Hypertension, Diabetes and Dyslipidemia.  Sonographer:     Lucendia Herrlich RCS Referring Phys:  [id number] AMY N COX Diagnosing Phys: Alwyn Pea MD IMPRESSIONS  1. Left ventricular ejection fraction, by estimation, is 20 to 25%. The left ventricle has severely decreased function. The left ventricle demonstrates global hypokinesis. Left ventricular diastolic parameters are consistent with Grade II diastolic dysfunction (pseudonormalization).  2. Right ventricular systolic function is moderately reduced. The right ventricular size is mildly enlarged.  3. Left atrial size was mildly dilated.  4. The mitral valve is normal in structure. Mild mitral valve regurgitation.  5. The aortic valve is normal in structure. Aortic valve regurgitation is trivial. Aortic valve sclerosis is present, with no evidence of aortic valve stenosis. Conclusion(s)/Recommendation(s): No evidence of valvular vegetations on this transthoracic echocardiogram. Consider a transesophageal  echocardiogram to exclude infective endocarditis if clinically indicated. No intracardiac source of embolism detected on  this transthoracic study. Consider a transesophageal echocardiogram to exclude cardiac source of embolism if clinically indicated. No left  ventricular mural or apical thrombus/thrombi. FINDINGS  Left Ventricle: Left ventricular ejection fraction, by estimation, is 20 to 25%. The left ventricle has severely decreased function. The left ventricle demonstrates global hypokinesis. Definity contrast agent was given IV to delineate the left ventricular endocardial borders. The left ventricular internal cavity size was normal in size. There is no left ventricular hypertrophy. Left ventricular diastolic parameters are consistent with Grade II diastolic dysfunction (pseudonormalization). Right Ventricle: The right ventricular size is mildly enlarged. No increase in right ventricular wall thickness. Right ventricular systolic function is moderately reduced. Left Atrium: Left atrial size was mildly dilated. Right Atrium: Right atrial size was normal in size. Pericardium: There is no evidence of pericardial effusion. Mitral Valve: The mitral valve is normal in structure. Mild mitral valve regurgitation. Tricuspid Valve: The tricuspid valve is normal in structure. Tricuspid valve regurgitation is mild. Aortic Valve: The aortic valve is normal in structure. Aortic valve regurgitation is trivial. Aortic valve sclerosis is present, with no evidence of aortic valve stenosis. Aortic valve peak gradient measures 3.4 mmHg. Pulmonic Valve: The pulmonic valve was normal in structure. Pulmonic valve regurgitation is not visualized. Aorta: The ascending aorta was not well visualized. IAS/Shunts: No atrial level shunt detected by color flow Doppler.  LEFT VENTRICLE PLAX 2D LVIDd:         4.70 cm   Diastology LVIDs:         4.50 cm   LV e' medial:    10.20 cm/s LV PW:         1.00 cm   LV E/e' medial:  9.0 LV IVS:        1.00 cm   LV e' lateral:   10.60 cm/s LVOT diam:     2.40 cm   LV E/e' lateral: 8.7 LV SV:         61 LV SV Index:   27 LVOT Area:     4.52 cm  RIGHT VENTRICLE             IVC RV S prime:     10.60 cm/s  IVC diam: 2.20 cm TAPSE (M-mode): 1.3 cm LEFT ATRIUM              Index        RIGHT ATRIUM           Index LA diam:        4.80 cm 2.15 cm/m   RA Area:     24.30 cm LA Vol (A2C):   79.1 ml 35.38 ml/m  RA Volume:   73.45 ml  32.85 ml/m LA Vol (A4C):   80.3 ml 35.92 ml/m LA Biplane Vol: 84.6 ml 37.84 ml/m  AORTIC VALVE AV Area (Vmax): 3.84 cm AV Vmax:        92.60 cm/s AV Peak Grad:   3.4 mmHg LVOT Vmax:      78.53 cm/s LVOT Vmean:     52.033 cm/s LVOT VTI:       0.135 m  AORTA Ao Root diam: 3.83 cm Ao Asc diam:  3.50 cm MITRAL VALVE MV Area (PHT): 4.86 cm    SHUNTS MV Decel Time: 156 msec    Systemic VTI:  0.14 m MR Peak grad: 41.9 mmHg    Systemic Diam: 2.40 cm MR Vmax:  323.67 cm/s MV E velocity: 92.00 cm/s MV A velocity: 70.60 cm/s MV E/A ratio:  1.30 Alwyn Pea MD Electronically signed by Alwyn Pea MD Signature Date/Time: 09/24/2023/11:28:15 AM    Final    CT ANGIO HEAD NECK W WO CM  Result Date: 09/22/2023 CLINICAL DATA:  Follow-up examination for stroke. EXAM: CT ANGIOGRAPHY HEAD AND NECK WITH AND WITHOUT CONTRAST TECHNIQUE: Multidetector CT imaging of the head and neck was performed using the standard protocol during bolus administration of intravenous contrast. Multiplanar CT image reconstructions and MIPs were obtained to evaluate the vascular anatomy. Carotid stenosis measurements (when applicable) are obtained utilizing NASCET criteria, using the distal internal carotid diameter as the denominator. RADIATION DOSE REDUCTION: This exam was performed according to the departmental dose-optimization program which includes automated exposure control, adjustment of the mA and/or kV according to patient size and/or use of iterative reconstruction technique. CONTRAST:  75mL OMNIPAQUE IOHEXOL 350 MG/ML SOLN COMPARISON:  MRI from earlier the same day. FINDINGS: CT HEAD FINDINGS Brain: Cerebral volume within normal limits. Mild chronic microvascular ischemic disease. Previously identified left cerebral infarcts grossly stable, better  characterized on prior MRI. No associated hemorrhage or mass effect. No other acute large vessel territory infarct. No mass lesion or midline shift. No hydrocephalus or extra-axial fluid collection. Vascular: No abnormal hyperdense vessel. Skull: Scalp soft tissues demonstrate no acute finding. Calvarium intact. Sinuses/Orbits: Globes and orbital soft tissues within normal limits. Visualized paranasal sinuses and mastoid air cells are clear. Other: None. Review of the MIP images confirms the above findings CTA NECK FINDINGS Aortic arch: Visualized aortic arch within normal limits for caliber with standard 3 vessel morphology. Mild aortic atherosclerosis. No stenosis about the origin the great vessels. Right carotid system: Right common and internal carotid arteries are patent without dissection. Bulky calcified plaque about the right carotid bulb with associated stenosis of up to 60% by NASCET criteria. Left carotid system: Left common and internal carotid arteries are patent without dissection. Calcified plaque about the left carotid bulb with associated stenosis of up to 60% by NASCET criteria. Vertebral arteries: Both vertebral arteries arise from subclavian arteries. No proximal subclavian artery stenosis. Atheromatous change at the origin of the left vertebral artery with mild stenosis. Left vertebral artery dominant, with a diffusely hypoplastic right vertebral artery. Vertebral arteries otherwise patent without stenosis or dissection. Skeleton: No discrete or worrisome osseous lesions. Mild to moderate spondylosis noted at C5-6 and C6-7. Prior sternotomy noted. Other neck: No other acute finding. Upper chest: Linear subsegmental atelectatic changes noted within the left lung. No other acute finding. Review of the MIP images confirms the above findings CTA HEAD FINDINGS Anterior circulation: Extensive atheromatous plaque throughout the carotid siphons with secondary moderate diffuse narrowing bilaterally. A1  segments patent bilaterally. Normal anterior communicating artery complex. Anterior cerebral arteries patent without significant stenosis. No M1 stenosis or occlusion. No proximal MCA branch occlusion or high-grade stenosis. Distal MCA branches perfused and symmetric. Posterior circulation: Both V4 segments patent without significant stenosis. Both PICA patent. Basilar patent without stenosis. Superior cerebellar arteries patent bilaterally. Both PCAs primarily supplied via the basilar. Short-segment moderate right P2 stenosis (series 11, image 20). Both PCAs otherwise patent to their distal aspects without significant stenosis. Venous sinuses: Patent allowing for timing the contrast bolus. Anatomic variants: As above.  No aneurysm. Review of the MIP images confirms the above findings IMPRESSION: CT HEAD: 1. Grossly stable left cerebral infarcts, better characterized on prior MRI. No associated hemorrhage or mass effect. 2.  No other acute intracranial abnormality. CTA HEAD AND NECK: 1. Negative CTA for large vessel occlusion or other emergent finding. 2. Bulky calcified plaque about the carotid bulbs with associated stenosis of up to 60% bilaterally. 3. Extensive atheromatous plaque throughout the carotid siphons with secondary moderate diffuse narrowing bilaterally. 4. Short-segment moderate right P2 stenosis. 5.  Aortic Atherosclerosis (ICD10-I70.0). Electronically Signed   By: Rise Mu M.D.   On: 09/22/2023 20:35   MR BRAIN WO CONTRAST  Result Date: 09/22/2023 CLINICAL DATA:  Right hand numbness over the last 2-3 weeks. Neuro deficit, acute, stroke suspected. EXAM: MRI HEAD WITHOUT CONTRAST TECHNIQUE: Multiplanar, multiecho pulse sequences of the brain and surrounding structures were obtained without intravenous contrast. COMPARISON:  CT head without contrast 01/15/2019. FINDINGS: Brain: Scattered foci of restricted diffusion are present over the left frontal and parietal convexity. Largest in  scratched at the large cortical infarct involves the postcentral gyrus, likely accounting for the right hand numbness. Whether cortical and subcortical foci are present. T2 and FLAIR hyperintensities are associated with the areas of acute/subacute infarction. Remote lacunar infarcts are present within the centrum semiovale bilaterally. Focal petechial hemorrhage is associated with the infarct of the left postcentral gyrus. Remote lacunar infarcts of the left thalamus are again noted. White matter changes extend into the brainstem. Vascular: Flow is present in the major intracranial arteries. Skull and upper cervical spine: The craniocervical junction is normal. Upper cervical spine is within normal limits. Marrow signal is unremarkable. Sinuses/Orbits: The paranasal sinuses and mastoid air cells are clear. Right lens replacement is present. Globes and orbits are otherwise within normal limits. IMPRESSION: 1. Scattered foci of acute/subacute infarction over the left frontal and parietal convexity. This may represent left-sided emboli or a watershed infarct. 2. The largest infarct involves the left postcentral gyrus, likely accounting for the right hand numbness. 3. Focal petechial hemorrhage associated with the infarct of the left postcentral gyrus. 4. Remote lacunar infarcts of the centrum semiovale bilaterally and left thalamus. 5. Underlying small vessel ischemic disease. These results were called by telephone at the time of interpretation on 09/22/2023 at 5:18 pm to provider [facility] , who verbally acknowledged these results. Electronically Signed   By: Marin Roberts M.D.   On: 09/22/2023 17:18   IR EXCHANGE BILIARY DRAIN  Result Date: 09/01/2023 INDICATION: Routine exchange, surgery planned early December EXAM: Exchange of percutaneous cholecystostomy tube using fluoroscopic guidance MEDICATIONS: None ANESTHESIA/SEDATION: Local analgesia FLUOROSCOPY: Radiation Exposure Index (as provided by the  fluoroscopic device): 1.2 minutes (25 mGy) COMPLICATIONS: None immediate. PROCEDURE: Informed written consent was obtained from the patient after a thorough discussion of the procedural risks, benefits and alternatives. All questions were addressed. Maximal Sterile Barrier Technique was utilized including caps, mask, sterile gowns, sterile gloves, sterile drape, hand hygiene and skin antiseptic. A timeout was performed prior to the initiation of the procedure. Patient was placed supine on the exam table. The right upper quadrant was prepped and draped in the standard sterile fashion with inclusion of the existing percutaneous cholecystostomy tube within the sterile field. The existing cholecystostomy tube was injected with contrast material, which demonstrated a decompressed gallbladder lumen. There is brisk filling of the cystic duct and common bile duct, which are noted to be normal in caliber without focal filling defects. There is passage of contrast material through the ampulla into the small bowel. The locking loop was released, and over an Amplatz guidewire, the existing cholecystostomy tube was exchanged for a new, similar ten [place] cholecystostomy tube.  Locking loop was formed, and injection of contrast material demonstrated appropriate location of the gallbladder lumen. It was secured to the skin using silk suture and a dressing. It was attached to bag drainage. The patient tolerated the procedure well without immediate complication. IMPRESSION: 1. Successful exchange of percutaneous cholecystostomy tube for a new, similar 10 French cholecystostomy tube. Cholecystostomy tube placed to bag drainage. 2. Cholangiogram demonstrated patent cystic duct and common bile duct without filling defect. Electronically Signed   By: Olive Bass M.D.   On: 09/01/2023 16:55    ECHO as above  TELEMETRY reviewed by me 09/26/2023: sinus rhythm rate 80s  EKG reviewed by me: sinus rhythm rate 89 bpm   Data reviewed  by me 09/26/2023: last 24h vitals tele labs imaging I/O hospitalist progress note, vascular surgery notes  Principal Problem:   Stroke-like symptom Active Problems:   Cardiomyopathy, dilated (HCC)   Hypertension   Insulin dependent type 2 diabetes mellitus (HCC)   Microalbuminuria due to type 2 diabetes mellitus (HCC)   Polyneuropathy   Metabolic acidosis   Acute cholecystitis   Truncal obesity   Cerebrovascular accident (CVA) (HCC)   Hyperlipidemia   Leukocytosis    ASSESSMENT AND PLAN:  Alexander [name] is a 72 y.o. male  with a past medical history of coronary artery disease s/p bypass with SVG to first diagonal, LIMA from the SVG to the diagonal to the LAD, SVG to the proximal RCA and acute marginal branch of the RCA and an SVG to the OM1 in 2017 at [facility], ischemic cardiomyopathy, type 2 diabetes, and hypertension who presented to the ED on 09/22/2023 for hand numbness. MRI brain with acute/subacute infarction over the left frontal and parietal convexity, CTA head/neck with carotid stenosis. Vascular surgery is planning for L carotid intervention. Cardiology was consulted for preoperative evaluation.    # Preoperative cardiac evaluation # Carotid artery stenosis # Acute CVA Patient with R sided weakness onset 2 weeks ago and persistent. MRI brain this admission with acute infarcts. CTA head/neck with carotid stenosis suspected to be cause of strokes.  -Nuclear stress test without evidence of ischemia, noted old apical infarct which is likely chronic from prior CAD/CABG.  -Patient is at intermediate but acceptable risk for proceeding with surgery understanding risks/benefits. Currently medically optimized.   # Coronary artery disease s/p CABG x5 in 2017 # Ischemic cardiomyopathy  Patient with hx of CABG in 2017 and ischemic cardiomyopathy, HFrEF. He denies any recent exertional/anginal symptoms. Denies functional limitations. Echo this admission with EF 20-25%, global hypokinesis.  Previously 40-45% in 12/2022, 30-35% in 2020. Denies SOB, orthopnea, LE edema. -See plan as above. -Continue atorvastatin 80 mg daily, aspirin 81 mg daily, plavix 75 mg daily. -Continue carvedilol 3.125 mg twice daily. Further additions to GDMT limited by hypotension.  Cardiology will sign off. Please haiku with questions or re-engage if needed.  Plan for follow up in clinic 1-2 weeks from discharge.  This patient's plan of care was discussed and created with Dr. Melton Alar and she is in agreement.  Signed: Gale Journey, PA-C  09/26/2023, 9:15 AM [facility] Cardiology

## 2023-09-26 NOTE — Progress Notes (Signed)
Occupational Therapy Treatment Patient Details Name: Alexander [name] MRN: [id number] DOB: February 20, 1951 Today's Date: 09/26/2023   History of present illness 36 male with history of hyperlipidemia, possible history of TIA, insulin-dependent diabetes melitis type II, hypertension, history of acute cholecystitis, who presents emergency department for chief concerns of right hand numbness for 2 to 3 weeks. Pssoible watershed infact and positive for L postcentral gyrus infarct.   OT comments  Mr Roughton was seen for OT treatment on this date. Upon arrival to room pt in bed, agreeable to tx. Pt requires MOD A for bed mobility and SUPERVISION to lateral scoot along EOB, defers standing. MIN A to remove tooth paste cap with R hand. Reviewed HEP and [facility] activities including plan to self-feed finger foods. Pt requires encouragement and repeated cues to utilize R hand for tasks. Pt making progress toward goals, will continue to follow POC. Discharge recommendation updated.      If plan is discharge home, recommend the following:  A little help with walking and/or transfers;A little help with bathing/dressing/bathroom;Assistance with cooking/housework;Assist for transportation;Help with stairs or ramp for entrance   Equipment Recommendations  None recommended by OT    Recommendations for Other Services      Precautions / Restrictions Precautions Precautions: Fall Restrictions Weight Bearing Restrictions: No       Mobility Bed Mobility Overal bed mobility: Needs Assistance Bed Mobility: Supine to Sit, Sit to Supine     Supine to sit: Mod assist Sit to supine: Mod assist        Transfers Overall transfer level: Needs assistance   Transfers: Bed to chair/wheelchair/BSC            Lateral/Scoot Transfers: Contact guard assist General transfer comment: along EOB     Balance Overall balance assessment: Needs assistance Sitting-balance support: Feet supported Sitting balance-Leahy Scale:  Good                                     ADL either performed or assessed with clinical judgement   ADL Overall ADL's : Needs assistance/impaired                                       General ADL Comments: MIN A to setup tooth paste      Cognition Arousal: Alert Behavior During Therapy: WFL for tasks assessed/performed Overall Cognitive Status: Difficult to assess                                 General Comments: repeatedly states items are in room that are not                   Pertinent Vitals/ Pain       Pain Assessment Pain Assessment: No/denies pain   Frequency  Min 1X/week        Progress Toward Goals  OT Goals(current goals can now be found in the care plan section)  Progress towards OT goals: Progressing toward goals  Acute Rehab OT Goals OT Goal Formulation: With patient Time For Goal Achievement: 10/06/23 Potential to Achieve Goals: Fair ADL Goals Pt Will Perform Grooming: with modified independence;standing Pt Will Perform Lower Body Dressing: with modified independence;sit to/from stand Pt Will Transfer to Toilet: with modified independence;ambulating Pt Will  Perform Toileting - Clothing Manipulation and hygiene: with modified independence;sit to/from stand  Plan      Co-evaluation                 AM-PAC OT "6 Clicks" Daily Activity     Outcome Measure   Help from another person eating meals?: None Help from another person taking care of personal grooming?: A Little Help from another person toileting, which includes using toliet, bedpan, or urinal?: A Little Help from another person bathing (including washing, rinsing, drying)?: A Little Help from another person to put on and taking off regular upper body clothing?: A Little Help from another person to put on and taking off regular lower body clothing?: A Little 6 Click Score: 19    End of Session    OT Visit Diagnosis:  Unsteadiness on feet (R26.81);Repeated falls (R29.6);Muscle weakness (generalized) (M62.81)   Activity Tolerance Patient tolerated treatment well   Patient Left in bed;with call bell/phone within reach;with family/visitor present   Nurse Communication          Time: 1610-9604 OT Time Calculation (min): 12 min  Charges: OT General Charges $OT Visit: 1 Visit OT Treatments $Therapeutic Activity: 8-22 mins   Kathie Dike, M.S. OTR/L  09/26/23, 3:24 PM  ascom [phone]

## 2023-09-26 NOTE — Progress Notes (Signed)
PT Cancellation Note  Patient Details Name: Alexander [name] MRN: [id number] DOB: May 24, 1951   Cancelled Treatment:    Reason Eval/Treat Not Completed: Patient at procedure or test/unavailable  Pt off unit for testing.  Will continue at later time/date.   Danielle Dess 09/26/2023, 1:43 PM

## 2023-09-27 ENCOUNTER — Encounter: Payer: Self-pay | Admitting: Osteopathic Medicine

## 2023-09-27 DIAGNOSIS — R2990 Unspecified symptoms and signs involving the nervous system: Secondary | ICD-10-CM | POA: Diagnosis not present

## 2023-09-27 LAB — URINALYSIS, W/ REFLEX TO CULTURE (INFECTION SUSPECTED)
Bilirubin Urine: NEGATIVE
Glucose, UA: 150 mg/dL — AB
Hgb urine dipstick: NEGATIVE
Ketones, ur: 5 mg/dL — AB
Nitrite: POSITIVE — AB
Protein, ur: 30 mg/dL — AB
Specific Gravity, Urine: 1.024 (ref 1.005–1.030)
WBC, UA: >50 WBC/hpf (ref 0–5)
pH: 5 (ref 5.0–8.0)

## 2023-09-27 LAB — BASIC METABOLIC PANEL
Anion gap: 8 (ref 5–15)
BUN: 18 mg/dL (ref 8–23)
CO2: 23 mmol/L (ref 22–32)
Calcium: 8.6 mg/dL — ABNORMAL LOW (ref 8.9–10.3)
Chloride: 102 mmol/L (ref 98–111)
Creatinine, Ser: 0.66 mg/dL (ref 0.61–1.24)
GFR, Estimated: >60 mL/min (ref >60)
Glucose, Bld: 163 mg/dL — ABNORMAL HIGH (ref 70–99)
Potassium: 3.7 mmol/L (ref 3.5–5.1)
Sodium: 133 mmol/L — ABNORMAL LOW (ref 135–145)

## 2023-09-27 LAB — GLUCOSE, CAPILLARY
Glucose-Capillary: 152 mg/dL — ABNORMAL HIGH (ref 70–99)
Glucose-Capillary: 175 mg/dL — ABNORMAL HIGH (ref 70–99)
Glucose-Capillary: 187 mg/dL — ABNORMAL HIGH (ref 70–99)
Glucose-Capillary: 145 mg/dL — ABNORMAL HIGH (ref 70–99)

## 2023-09-27 LAB — MAGNESIUM: Magnesium: 1.8 mg/dL (ref 1.7–2.4)

## 2023-09-27 LAB — CBC
HCT: 35.1 % — ABNORMAL LOW (ref 39.0–52.0)
Hemoglobin: 11.8 g/dL — ABNORMAL LOW (ref 13.0–17.0)
MCH: 29.4 pg (ref 26.0–34.0)
MCHC: 33.6 g/dL (ref 30.0–36.0)
MCV: 87.5 fL (ref 80.0–100.0)
Platelets: 339 10*3/uL (ref 150–400)
RBC: 4.01 MIL/uL — ABNORMAL LOW (ref 4.22–5.81)
RDW: 15.5 % (ref 11.5–15.5)
WBC: 23.7 10*3/uL — ABNORMAL HIGH (ref 4.0–10.5)
nRBC: 0 % (ref 0.0–0.2)

## 2023-09-27 LAB — TYPE AND SCREEN
ABO/RH(D): O POS
Antibody Screen: NEGATIVE

## 2023-09-27 LAB — PHOSPHORUS: Phosphorus: 3.2 mg/dL (ref 2.5–4.6)

## 2023-09-27 LAB — PROCALCITONIN: Procalcitonin: <0.1 ng/mL

## 2023-09-27 MED ORDER — SODIUM CHLORIDE 0.9 % IV SOLN
2.0000 g | INTRAVENOUS | Status: DC
  Administered 2023-09-27 – 2023-09-29 (×2): 2 g via INTRAVENOUS
  Filled 2023-09-27 (×4): qty 20

## 2023-09-27 NOTE — Progress Notes (Signed)
[facility] Progress Note  Patient: Alexander [name]    VZD:[id number]  DOA: 09/22/2023     Date of Service: the patient was seen and examined on 09/27/2023  Chief Complaint  Patient presents with   hand numbness   Brief hospital course: [name] is a 60 male with history of hyperlipidemia, possible history of TIA, insulin-dependent diabetes melitis type II, hypertension, history of acute cholecystitis, who presents emergency department for chief concerns of right hand numbness for 2 to 3 weeks. It has gradually worsened, prompting him to present to the emergency department for further evaluation as he is supposed to have cholecystectomy next week. He wanted to figure this out before the surgery.    Hospital course / significant events:  11/29: admitted to hospitalist service for concern for CVA 11/30: (+)Carotid disease on CTA 12/01: Vascular consult - possible for carotid endarterectomy, will discuss tomorrow (today is Sunday). Echo also showing worsening EF 20-25% and grade 2 diastolic dysfunction w/ global hypokinesis - will need cardiology clearance prior to anesthesia 12/02: vascular planning intervention on carotid stenosis pending cardiology eval and gen surg recs re: chole 12/03: stress test today no concerns which would preclude surgery.    Consultants:  Neurology Vascular surgery  Cardiology  General Surgery   Assessment and Plan:  Stroke Left frontal and parietal convexity with scattered foci, concerning for left-sided emboli or watershed infarct. Largest infarct involving the left postcentral gyrus, can account for the right hand numbness. Was on ASA daily prior to this event  Plavix 75 mg daily + aspirin 81 mg daily    Carotid artery stenosis vascular planning intervention on carotid stenosis, stent vs endarterectomy   Plan for CEA tomorrow a.m. 12/5   Chronic HFrEF Ischemic cardiomyopathy  Echo showing worsening EF but does not appear to be in  exacerbation at this time  needed cardiology clearance prior to anesthesia Diuresis as needed Cardiology consulted s/p NM cardiac stress test --> no concerns, (+)old ischemia, clear to proceed w/ surgery    12/4 episode of nonsustained V. tach 7 beats, patient was sleeping and asymptomatic Cardiology eval, recommended to start CPAP for possibility of OSA  Acute cholecystitis Drain is in place since Sep 2024 asper pt General surgery consult - carotid intervention takes priority, continue drain care and plan drain exchange  Elective surgery initially planned for this week has been postponed    Metabolic acidosis - resolved Sodium bicarbonate 650 mg p.o. twice daily, 2 doses ordered on admission Monitor BMP    Hypertension Home Coreg 1.6 mg p.o. twice daily with meals resumed   Insulin dependent type 2 diabetes mellitus (HCC) A1c was 6.7 on 07/29/23 Insulin SSI with at bedtime coverage ordered   Leukocytosis Mild leukocytosis, suspect reactive in setting of stroke Monitor periodic CBC   UTI, UA positive WBC 11.2---23.7 significantly elevated on 12/4 12/4 started empiric antibiotics ceftriaxone 2 g IV daily  Procalcitonin negative <0.01 Follow urine culture, if negative then we will de-escalate antibiotics.   Hyperlipidemia Atorvastatin 80 mg daily    Body mass index is 30.2 kg/m.  Interventions:  Diet: Heart healthy/carb modified DVT Prophylaxis: Subcutaneous Lovenox   Advance goals of care discussion: Full code  Family Communication: family was not present at bedside, at the time of interview.  The pt provided permission to discuss medical plan with the family. Opportunity was given to ask question and all questions were answered satisfactorily.   Disposition:  Pt is from Home, admitted with acute CVA  and left carotid stenosis, scheduled by vascular surgery for left CEA tomorrow a.m., which precludes a safe discharge. Discharge to home, when stable and cleared by  vascular surgery, most likely in 1 to 2 days.  Subjective: No significant events overnight, patient was sitting in the recliner comfortably, stated that his right hand is swollen weak power is less, does not feel worsening and no new neurological symptoms.  Mild dysuria, no abdominal pain  Physical Exam: General: NAD, lying comfortably Appear in no distress, affect appropriate Eyes: PERRLA ENT: Oral Mucosa Clear, moist  Neck: no JVD,  Cardiovascular: S1 and S2 Present, no Murmur,  Respiratory: good respiratory effort, Bilateral Air entry equal and Decreased, no Crackles, no wheezes Abdomen: Bowel Sound present, Soft and no tenderness, cholecystotomy tube intact Skin: no rashes Extremities: no Pedal edema, no calf tenderness Neurologic: without any new focal findings Gait not checked due to patient safety concerns  Vitals:   09/26/23 2342 09/27/23 0338 09/27/23 0818 09/27/23 1135  BP: (!) 108/59 (!) 114/57 111/62 (!) 112/57  Pulse: 88 86 81 80  Resp: 18 18 18 16   Temp: 98 F (36.7 C) 97.7 F (36.5 C) 98.1 F (36.7 C) (!) 97.5 F (36.4 C)  TempSrc: Oral Oral    SpO2: 93% 96% 96% 99%  Weight:      Height:        Intake/Output Summary (Last 24 hours) at 09/27/2023 1606 Last data filed at 09/26/2023 1956 Gross per 24 hour  Intake 10 ml  Output 200 ml  Net -190 ml   Filed Weights   09/22/23 1433 09/22/23 1839 09/23/23 2023  Weight: 101.6 kg 101.6 kg 101 kg    Data Reviewed: I have personally reviewed and interpreted daily labs, tele strips, imagings as discussed above. I reviewed all nursing notes, pharmacy notes, vitals, pertinent old records I have discussed plan of care as described above with RN and patient/family.  CBC: Recent Labs  Lab 09/22/23 1558 09/27/23 0424  WBC 11.2* 23.7*  NEUTROABS 8.1*  --   HGB 13.0 11.8*  HCT 40.2 35.1*  MCV 91.0 87.5  PLT 323 339   Basic Metabolic Panel: Recent Labs  Lab 09/22/23 1558 09/24/23 0407 09/25/23 0552  09/26/23 0421 09/27/23 0424  NA 134* 138 136 135 133*  K 4.1 3.6 3.7 3.7 3.7  CL 103 105 103 103 102  CO2 18* 22 24 25 23   GLUCOSE 125* 167* 118* 120* 163*  BUN 23 20 20 21 18   CREATININE 0.58* 0.49* 0.59* 0.57* 0.66  CALCIUM 8.4* 8.9 8.6* 8.4* 8.6*  MG  --  2.1 2.0 2.1 1.8  PHOS  --   --   --   --  3.2    Studies: CT HEAD CODE STROKE WO CONTRAST`  Result Date: 09/26/2023 CLINICAL DATA:  Code stroke.  Acute neurologic deficit EXAM: CT HEAD WITHOUT CONTRAST TECHNIQUE: Contiguous axial images were obtained from the base of the skull through the vertex without intravenous contrast. RADIATION DOSE REDUCTION: This exam was performed according to the departmental dose-optimization program which includes automated exposure control, adjustment of the mA and/or kV according to patient size and/or use of iterative reconstruction technique. COMPARISON:  None Available. FINDINGS: Brain: There is no mass, hemorrhage or extra-axial collection. Mild volume loss. Mild white matter hypoattenuation compatible with chronic small vessel disease. Vascular: No abnormal hyperdensity of the major intracranial arteries or dural venous sinuses. No intracranial atherosclerosis. Skull: The visualized skull base, calvarium and extracranial soft tissues are normal.  Sinuses/Orbits: No fluid levels or advanced mucosal thickening of the visualized paranasal sinuses. No mastoid or middle ear effusion. The orbits are normal. ASPECTS [facility] Stroke Program Early CT Score) - Ganglionic level infarction (caudate, lentiform nuclei, internal capsule, insula, M1-M3 cortex): 7 - Supraganglionic infarction (M4-M6 cortex): 3 Total score (0-10 with 10 being normal): 10 IMPRESSION: 1. No acute intracranial abnormality. 2. ASPECTS is 10. 3. Mild chronic small vessel disease and volume loss. These results were called by telephone at the time of interpretation on 09/26/2023 at 8:31 pm to provider MODOU JAWO , who verbally acknowledged these  results. Electronically Signed   By: Deatra Robinson M.D.   On: 09/26/2023 20:34    Scheduled Meds:  aspirin EC  81 mg Oral Daily   atorvastatin  80 mg Oral QHS   carvedilol  1.5625 mg Oral BID WC   clopidogrel  75 mg Oral Daily   enoxaparin (LOVENOX) injection  0.5 mg/kg Subcutaneous Q24H   insulin aspart  0-15 Units Subcutaneous TID WC   insulin aspart  0-5 Units Subcutaneous QHS   insulin glargine-yfgn  20 Units Subcutaneous QHS   sodium chloride flush  5 mL Intracatheter Daily   Continuous Infusions:  cefTRIAXone (ROCEPHIN)  IV 2 g (09/27/23 1342)   PRN Meds: acetaminophen **OR** acetaminophen (TYLENOL) oral liquid 160 mg/5 mL **OR** acetaminophen, bisacodyl, senna-docusate  Time spent: 35 minutes  Author: Gillis Santa. MD [facility] 09/27/2023 4:06 PM  To reach On-call, see care teams to locate the attending and reach out to them via [email or web address]. If 7PM-7AM, please contact night-coverage If you still have difficulty reaching the attending provider, please page the [facility] (Director on Call) for [facility] on amion for assistance.

## 2023-09-27 NOTE — Progress Notes (Signed)
Occupational Therapy Treatment Patient Details Name: Alexander [name] MRN: [id number] DOB: 1951-04-30 Today's Date: 09/27/2023   History of present illness 75 male with history of hyperlipidemia, possible history of TIA, insulin-dependent diabetes melitis type II, hypertension, history of acute cholecystitis, who presents emergency department for chief concerns of right hand numbness for 2 to 3 weeks. Possible watershed infact and positive for L postcentral gyrus infarct.   OT comments  Pt seen for OT treatment on this date. Upon arrival to room pt supine in bed with RN giving meds, agreeable to tx. Pt completed all bed mobility with Supervision. Pt required cueing for hand rail use and hand placement. Pt completed STS with CGA +RW. Pt completed grooming tasks (face washing, oral care and hand hygiene) in standing at the sink with CGA +RW. Noted decreased coordination on RUE and required increased time to complete task. Pt reported feeling numbness and tingling on the RUE, pt monitored during session. Pt requested bathroom use. Pt ambulated and completed toilet t/f with CGA +RW. Pt completed clothing management and hygiene with CGA. Pt returned to bedside. Pt left supine in bed with call bell within reach and all needs met. Pt making good progress toward goals, will continue to follow POC. Discharge recommendation remains appropriate.        If plan is discharge home, recommend the following:  A little help with walking and/or transfers;A little help with bathing/dressing/bathroom;Assistance with cooking/housework;Assist for transportation;Help with stairs or ramp for entrance   Equipment Recommendations  None recommended by OT    Recommendations for Other Services      Precautions / Restrictions Precautions Precautions: Fall Restrictions Weight Bearing Restrictions: No       Mobility Bed Mobility Overal bed mobility: Needs Assistance Bed Mobility: Supine to Sit, Sit to Supine      Supine to sit: Supervision, HOB elevated, Used rails     General bed mobility comments: extra time, slightly effortful but no real difficulty observed, cuing for rail use prn and hand placement Patient Response: Cooperative  Transfers Overall transfer level: Needs assistance Equipment used: Rolling walker (2 wheels) Transfers: Sit to/from Stand Sit to Stand: Contact guard assist                 Balance Overall balance assessment: Needs assistance Sitting-balance support: Feet supported, No upper extremity supported Sitting balance-Leahy Scale: Good     Standing balance support: Bilateral upper extremity supported, During functional activity Standing balance-Leahy Scale: Good                             ADL either performed or assessed with clinical judgement   ADL Overall ADL's : Needs assistance/impaired                                     Functional mobility during ADLs: Contact guard assist;Rolling walker (2 wheels);Cueing for safety;Cueing for sequencing General ADL Comments: Pt completed grooming tasks (face washing, oral care and hand hygiene) in standing with CGA +RW. Noted decreased coordination on RUE and required increased time to complete task. Pt reported feeling numbness and tingling on the RUE, pt monitored during session. Pt requested bathroom use. Pt ambulated and completed toilet t/f with CGA +RW. Pt completed clothing management and hygiene with CGA.    Extremity/Trunk Assessment Upper Extremity Assessment Upper Extremity Assessment: Generalized weakness;Right hand dominant;RUE deficits/detail RUE  Deficits / Details: numbness and tingling reported on RUE. RUE Sensation: decreased light touch RUE Coordination: decreased fine motor   Lower Extremity Assessment Lower Extremity Assessment: Generalized weakness        Vision Patient Visual Report: No change from baseline     Perception     Praxis      Cognition  Arousal: Alert Behavior During Therapy: WFL for tasks assessed/performed Overall Cognitive Status: Within Functional Limits for tasks assessed                                 General Comments: energized, lighthearted throughout session        Exercises      Shoulder Instructions       General Comments      Pertinent Vitals/ Pain       Pain Assessment Pain Assessment: No/denies pain  Home Living   Living Arrangements: Spouse/significant other                                      Prior Functioning/Environment              Frequency  Min 1X/week        Progress Toward Goals  OT Goals(current goals can now be found in the care plan section)  Progress towards OT goals: Progressing toward goals     Plan      Co-evaluation                 AM-PAC OT "6 Clicks" Daily Activity     Outcome Measure   Help from another person eating meals?: None Help from another person taking care of personal grooming?: A Little Help from another person toileting, which includes using toliet, bedpan, or urinal?: A Little Help from another person bathing (including washing, rinsing, drying)?: A Little Help from another person to put on and taking off regular upper body clothing?: A Little Help from another person to put on and taking off regular lower body clothing?: A Little 6 Click Score: 19    End of Session Equipment Utilized During Treatment: Rolling walker (2 wheels)  OT Visit Diagnosis: Unsteadiness on feet (R26.81);Repeated falls (R29.6);Muscle weakness (generalized) (M62.81)   Activity Tolerance Patient tolerated treatment well   Patient Left in bed;with call bell/phone within reach   Nurse Communication Mobility status        Time: 1435-1456 OT Time Calculation (min): 21 min  Charges:    Butch Penny, SOT

## 2023-09-27 NOTE — Progress Notes (Signed)
Progress Note    09/27/2023 10:40 AM * No surgery date entered *  Subjective:  Alexander [name] is a 89 [name] with history of hyperlipidemia, possible history of TIA, insulin-dependent diabetes melitis type II, hypertension, history of acute cholecystitis, who presents emergency department for chief concerns of right hand numbness for 2 to 3 weeks. It has gradually worsened, prompting him to present to the emergency department for further evaluation as he is supposed to have cholecystectomy next week. He wanted to figure this out before the surgery.    On exam today the patient is resting comfortably in bed.  He is now post nuclear stress test which he passed.  Patient has been cleared for any type of procedure or surgery by vascular surgery today.  Vascular Surgery plans on taking the patient to the lab for Endovascular left carotid stent placement tomorrow 09/28/23.   Vitals:   09/27/23 0338 09/27/23 0818  BP: (!) 114/57 111/62  Pulse: 86 81  Resp: 18 18  Temp: 97.7 F (36.5 C) 98.1 F (36.7 C)  SpO2: 96% 96%   Physical Exam: Cardiac:  RRR, Normal S1, S2. No murmurs Lungs:  Clear on auscultation. Non labored breathing on room air.  Incisions:  None  Extremities:  Warm to touch with palpable pulses throughout. Numbness to his fingers on the right. Full ROM.  Abdomen:  Positive bowel sounds throughout, soft, non tender and non distended.  Neurologic: AAOX4 and follows commands appropriately  CBC    Component Value Date/Time   WBC 23.7 (H) 09/27/2023 0424   RBC 4.01 (L) 09/27/2023 0424   HGB 11.8 (L) 09/27/2023 0424   HCT 35.1 (L) 09/27/2023 0424   PLT 339 09/27/2023 0424   MCV 87.5 09/27/2023 0424   MCH 29.4 09/27/2023 0424   MCHC 33.6 09/27/2023 0424   RDW 15.5 09/27/2023 0424   LYMPHSABS 2.0 09/22/2023 1558   MONOABS 0.8 09/22/2023 1558   EOSABS 0.2 09/22/2023 1558   BASOSABS 0.0 09/22/2023 1558    BMET    Component Value Date/Time   NA 133 (L) 09/27/2023 0424   K  3.7 09/27/2023 0424   CL 102 09/27/2023 0424   CO2 23 09/27/2023 0424   GLUCOSE 163 (H) 09/27/2023 0424   BUN 18 09/27/2023 0424   CREATININE 0.66 09/27/2023 0424   CALCIUM 8.6 (L) 09/27/2023 0424   GFRNONAA >60 09/27/2023 0424   GFRAA >60 03/17/2019 1306    INR    Component Value Date/Time   INR 1.2 07/28/2023 1554     Intake/Output Summary (Last 24 hours) at 09/27/2023 1040 Last data filed at 09/26/2023 1956 Gross per 24 hour  Intake 10 ml  Output 200 ml  Net -190 ml     Assessment/Plan:  Alexander [name]  who presents to [facility] emergency room with worsening right hand numbness and weakness of the past 2 to 3 weeks.  Upon workup patient was noted to have bilateral carotid stenosis left greater than right with cardiomyopathy and ejection fraction of 20 to 25%.  He has now been cleared by cardiology to undergo endovascular stent placement  * No surgery date entered *   PLAN: Vascular surgery plans on taking the patient to the vascular lab tomorrow 09/28/2023 for endovascular left carotid angiography with stent placement.  Again I discussed in detail with the patient and his wife the procedure, benefits, risk, and complications.  Both would like to proceed as soon as possible.  I answered all her questions again this  morning.  Patient will be made n.p.o. after midnight tomorrow.  I discussed the plan in detail with Dr. [name] MD and he agrees with plan.  DVT prophylaxis:   ASA 81 mg daily, Plavix 75 mg daily, Lovenox injection 50 mg every 24 hours    Marcie Bal [facility] 09/27/2023 10:40 AM

## 2023-09-27 NOTE — Plan of Care (Signed)
  Problem: Education: Goal: Knowledge of disease or condition will improve Outcome: Progressing Goal: Knowledge of secondary prevention will improve (MUST DOCUMENT ALL) Outcome: Progressing Goal: Knowledge of patient specific risk factors will improve Loraine Leriche N/A or DELETE if not current risk factor) Outcome: Progressing   Problem: Ischemic Stroke/TIA Tissue Perfusion: Goal: Complications of ischemic stroke/TIA will be minimized Outcome: Progressing   Problem: Coping: Goal: Will verbalize positive feelings about self Outcome: Progressing Goal: Will identify appropriate support needs Outcome: Progressing   Problem: Health Behavior/Discharge Planning: Goal: Ability to manage health-related needs will improve Outcome: Progressing Goal: Goals will be collaboratively established with patient/family Outcome: Progressing   Problem: Self-Care: Goal: Ability to participate in self-care as condition permits will improve Outcome: Progressing Goal: Verbalization of feelings and concerns over difficulty with self-care will improve Outcome: Progressing Goal: Ability to communicate needs accurately will improve Outcome: Progressing   Problem: Nutrition: Goal: Risk of aspiration will decrease Outcome: Progressing Goal: Dietary intake will improve Outcome: Progressing   Problem: Education: Goal: Ability to describe self-care measures that may prevent or decrease complications (Diabetes Survival Skills Education) will improve Outcome: Progressing Goal: Individualized Educational Video(s) Outcome: Progressing   Problem: Coping: Goal: Ability to adjust to condition or change in health will improve Outcome: Progressing   Problem: Fluid Volume: Goal: Ability to maintain a balanced intake and output will improve Outcome: Progressing   Problem: Health Behavior/Discharge Planning: Goal: Ability to identify and utilize available resources and services will improve Outcome:  Progressing Goal: Ability to manage health-related needs will improve Outcome: Progressing   Problem: Metabolic: Goal: Ability to maintain appropriate glucose levels will improve Outcome: Progressing   Problem: Nutritional: Goal: Maintenance of adequate nutrition will improve Outcome: Progressing Goal: Progress toward achieving an optimal weight will improve Outcome: Progressing   Problem: Skin Integrity: Goal: Risk for impaired skin integrity will decrease Outcome: Progressing   Problem: Tissue Perfusion: Goal: Adequacy of tissue perfusion will improve Outcome: Progressing   Problem: Education: Goal: Knowledge of General Education information will improve Description: Including pain rating scale, medication(s)/side effects and non-pharmacologic comfort measures Outcome: Progressing   Problem: Health Behavior/Discharge Planning: Goal: Ability to manage health-related needs will improve Outcome: Progressing   Problem: Clinical Measurements: Goal: Ability to maintain clinical measurements within normal limits will improve Outcome: Progressing Goal: Will remain free from infection Outcome: Progressing Goal: Diagnostic test results will improve Outcome: Progressing Goal: Respiratory complications will improve Outcome: Progressing Goal: Cardiovascular complication will be avoided Outcome: Progressing   Problem: Activity: Goal: Risk for activity intolerance will decrease Outcome: Progressing   Problem: Nutrition: Goal: Adequate nutrition will be maintained Outcome: Progressing   Problem: Coping: Goal: Level of anxiety will decrease Outcome: Progressing   Problem: Elimination: Goal: Will not experience complications related to bowel motility Outcome: Progressing Goal: Will not experience complications related to urinary retention Outcome: Progressing   Problem: Pain Management: Goal: General experience of comfort will improve Outcome: Progressing   Problem:  Safety: Goal: Ability to remain free from injury will improve Outcome: Progressing   Problem: Skin Integrity: Goal: Risk for impaired skin integrity will decrease Outcome: Progressing

## 2023-09-27 NOTE — Progress Notes (Signed)
Physical Therapy Treatment Patient Details Name: Alexander [name] MRN: [id number] DOB: 1951/06/01 Today's Date: 09/27/2023   History of Present Illness 74 male with history of hyperlipidemia, possible history of TIA, insulin-dependent diabetes melitis type II, hypertension, history of acute cholecystitis, who presents emergency department for chief concerns of right hand numbness for 2 to 3 weeks. Possible watershed infact and positive for L postcentral gyrus infarct.    PT Comments  Pt was pleasant and motivated to participate during the session and put forth good effort throughout. Pt completed bed mobility with Superv, reporting mild lightheadedness upon sitting that resolved quickly. Pt completed initial transfer with CGA, though after receiving cuing, able to complete with Superv. Pt amb with RW and CGA in hall, exhibiting safe gait and reporting only minimal fatigue afterward. Pt performed stair training with CGA, demonstrating safe and efficient sequencing given deficits. Pt's vitals were monitored during session and remained WNL on RA. Pt will benefit from continued PT services upon discharge to safely address deficits listed in patient problem list for decreased caregiver assistance and eventual return to PLOF.     If plan is discharge home, recommend the following: A little help with walking and/or transfers;Assist for transportation;Help with stairs or ramp for entrance   Can travel by private vehicle        Equipment Recommendations  None recommended by PT    Recommendations for Other Services       Precautions / Restrictions Precautions Precautions: Fall Restrictions Weight Bearing Restrictions: No     Mobility  Bed Mobility Overal bed mobility: Needs Assistance Bed Mobility: Supine to Sit     Supine to sit: Supervision, HOB elevated, Used rails     General bed mobility comments: extra time, slightly effortful but no real difficulty observed, cuing for rail use prn;  mild lightheadedness upon sitting    Transfers Overall transfer level: Needs assistance Equipment used: Rolling walker (2 wheels) Transfers: Sit to/from Stand Sit to Stand: Contact guard assist, Supervision           General transfer comment: initial steadying required at trunk due to repeated quick attempts and pulling up on RW unsuccessfully, after recieving cuing for hand placement and sequencing, greatly improved STS, exhibiting capable BLE strength and steadiness    Ambulation/Gait Ambulation/Gait assistance: Contact guard assist Gait Distance (Feet): 80 Feet x1, 185 Feet x1 Assistive device: Rolling walker (2 wheels) Gait Pattern/deviations: Step-through pattern Gait velocity: WNL     General Gait Details: initially decreased speed that improved with distance, steady throughout - reporting near baseline, exhibiting mild R foot drop but did not observe any toe catching/dragging throughout, difficulty with maintaining R grip on RW, but overall able to direct RW in nearly straight line and manage turns, cuing for upright posture, RW management   Stairs Stairs: Yes Stairs assistance: Contact guard assist Stair Management: Two rails, Step to pattern, Forwards Number of Stairs: 4 General stair comments: good understanding of proper sequencing due to prior use given RLE weakness, quick and stable during both ascent and descent, limited ability to use R hand on rail but overall safe without assist, cuing for hand placement   Wheelchair Mobility     Tilt Bed    Modified Rankin (Stroke Patients Only)       Balance Overall balance assessment: Needs assistance Sitting-balance support: Feet supported, No upper extremity supported Sitting balance-Leahy Scale: Good     Standing balance support: Bilateral upper extremity supported, During functional activity Standing balance-Leahy Scale: Good  Standing balance comment: steady in static standing without AD, light use of RW  with dynamic activities, able to wash hands at sink without assist                            Cognition Arousal: Alert Behavior During Therapy: [facility] for tasks assessed/performed Overall Cognitive Status: Within Functional Limits for tasks assessed                                 General Comments: energized, lighthearted throughout session        Exercises      General Comments        Pertinent Vitals/Pain Pain Assessment Pain Assessment: No/denies pain    Home Living   Living Arrangements: Spouse/significant other                      Prior Function            PT Goals (current goals can now be found in the care plan section) Acute Rehab PT Goals Patient Stated Goal: No goals stated. PT Goal Formulation: With patient Time For Goal Achievement: 10/07/23 Potential to Achieve Goals: Good Progress towards PT goals: Progressing toward goals    Frequency    Min 1X/week      PT Plan      Co-evaluation              AM-PAC PT "6 Clicks" Mobility   Outcome Measure  Help needed turning from your back to your side while in a flat bed without using bedrails?: None Help needed moving from lying on your back to sitting on the side of a flat bed without using bedrails?: A Little Help needed moving to and from a bed to a chair (including a wheelchair)?: A Little Help needed standing up from a chair using your arms (e.g., wheelchair or bedside chair)?: A Little Help needed to walk in hospital room?: A Little Help needed climbing 3-5 steps with a railing? : A Little 6 Click Score: 19    End of Session Equipment Utilized During Treatment: Gait belt Activity Tolerance: Patient tolerated treatment well Patient left: in chair;with call bell/phone within reach;with chair alarm set Nurse Communication: Mobility status PT Visit Diagnosis: Unsteadiness on feet (R26.81);Other abnormalities of gait and mobility (R26.89);Muscle weakness  (generalized) (M62.81);Difficulty in walking, not elsewhere classified (R26.2)     Time: 8295-6213 PT Time Calculation (min) (ACUTE ONLY): 24 min  Charges:                           Rosiland Oz SPT 09/27/23, 4:14 PM

## 2023-09-27 NOTE — Progress Notes (Signed)
   09/26/23 1956  Neurological  Neuro (WDL) X  Orientation Level Oriented X4  R Pupil Size (mm) 4  R Pupil Shape Round  R Pupil Reaction Nonreactive  L Pupil Size (mm) 3  L Pupil Shape Round  L Pupil Reaction Brisk  Motor Function/Sensation Assessment Grip;Dorsiflexion;Plantar flexion;Sensation  R Hand Grip Moderate  L Hand Grip Strong  R Foot Dorsiflexion  (drop foot at baseline)  L Foot Dorsiflexion Weak  RUE Motor Response Purposeful movement  RUE Sensation Decreased  RUE Motor Strength 4  LUE Motor Response Purposeful movement  LUE Sensation Full sensation  NIH Stroke Scale   Dizziness Present Yes  Headache Present No  Interval Neuro change  Level of Consciousness (1a.)    0  LOC Questions (1b. )    0  LOC Commands (1c. )    0  Best Gaze (2. )   1  Visual (3. )   1  Facial Palsy (4. )     0  Motor Arm, Left (5a. )    0  Motor Arm, Right (5b. )  1  Motor Leg, Left (6a. )   0  Motor Leg, Right (6b. )  0  Limb Ataxia (7. ) 1  Sensory (8. )   1  Best Language (9. )   0  Dysarthria (10. ) 0  Extinction/Inattention (11.)    0  Complete NIHSS TOTAL 5   Patient Leaning to R side unable to sit on edge of bed without falling to R side. Per dayshift RN this is a change for the patient since being admitted. Patient also complaining of new dizziness, has R arm ataxia not previously documented on R arm with decreased sensation, patient's R eye has slight gaze difference, R eye with decreased visual field. Patient was also 2+ assist with R leg dragging which is new as well per dayshift RN.   Patient's wife at the bedside states he has not been leaning to the R side unable to sit up on his own without holding on until this evening.   Code stroke called, CN notified Larkin Ina, NP at bedside, Rapid RN to bedside. Patient taken to CT.

## 2023-09-28 ENCOUNTER — Encounter: Admission: RE | Payer: Self-pay | Source: Home / Self Care

## 2023-09-28 ENCOUNTER — Encounter
Admission: EM | Disposition: A | Discharge status: Home Health | Payer: Self-pay | Source: Home / Self Care | Attending: Osteopathic Medicine

## 2023-09-28 ENCOUNTER — Other Ambulatory Visit: Payer: Self-pay | Admitting: Surgery

## 2023-09-28 ENCOUNTER — Ambulatory Visit: Admission: RE | Admit: 2023-09-28 | Payer: Medicare Other | Source: Home / Self Care | Admitting: Surgery

## 2023-09-28 DIAGNOSIS — I6523 Occlusion and stenosis of bilateral carotid arteries: Secondary | ICD-10-CM

## 2023-09-28 DIAGNOSIS — R2990 Unspecified symptoms and signs involving the nervous system: Secondary | ICD-10-CM | POA: Diagnosis not present

## 2023-09-28 DIAGNOSIS — I639 Cerebral infarction, unspecified: Secondary | ICD-10-CM | POA: Diagnosis not present

## 2023-09-28 DIAGNOSIS — K810 Acute cholecystitis: Secondary | ICD-10-CM

## 2023-09-28 HISTORY — PX: CAROTID PTA/STENT INTERVENTION: CATH118231

## 2023-09-28 LAB — BASIC METABOLIC PANEL
Anion gap: 10 (ref 5–15)
BUN: 20 mg/dL (ref 8–23)
CO2: 24 mmol/L (ref 22–32)
Calcium: 8.8 mg/dL — ABNORMAL LOW (ref 8.9–10.3)
Chloride: 102 mmol/L (ref 98–111)
Creatinine, Ser: 0.69 mg/dL (ref 0.61–1.24)
GFR, Estimated: >60 mL/min (ref >60)
Glucose, Bld: 124 mg/dL — ABNORMAL HIGH (ref 70–99)
Potassium: 3.9 mmol/L (ref 3.5–5.1)
Sodium: 136 mmol/L (ref 135–145)

## 2023-09-28 LAB — CBC
HCT: 37.7 % — ABNORMAL LOW (ref 39.0–52.0)
Hemoglobin: 12.4 g/dL — ABNORMAL LOW (ref 13.0–17.0)
MCH: 29.9 pg (ref 26.0–34.0)
MCHC: 32.9 g/dL (ref 30.0–36.0)
MCV: 90.8 fL (ref 80.0–100.0)
Platelets: 360 10*3/uL (ref 150–400)
RBC: 4.15 MIL/uL — ABNORMAL LOW (ref 4.22–5.81)
RDW: 15.6 % — ABNORMAL HIGH (ref 11.5–15.5)
WBC: 13 10*3/uL — ABNORMAL HIGH (ref 4.0–10.5)
nRBC: 0 % (ref 0.0–0.2)

## 2023-09-28 LAB — PHOSPHORUS: Phosphorus: 3.3 mg/dL (ref 2.5–4.6)

## 2023-09-28 LAB — GLUCOSE, CAPILLARY
Glucose-Capillary: 132 mg/dL — ABNORMAL HIGH (ref 70–99)
Glucose-Capillary: 143 mg/dL — ABNORMAL HIGH (ref 70–99)
Glucose-Capillary: 211 mg/dL — ABNORMAL HIGH (ref 70–99)
Glucose-Capillary: 175 mg/dL — ABNORMAL HIGH (ref 70–99)

## 2023-09-28 LAB — MAGNESIUM: Magnesium: 2 mg/dL (ref 1.7–2.4)

## 2023-09-28 LAB — POCT ACTIVATED CLOTTING TIME: Activated Clotting Time: 227 s

## 2023-09-28 SURGERY — CAROTID PTA/STENT INTERVENTION
Anesthesia: Moderate Sedation | Laterality: Left

## 2023-09-28 SURGERY — XI ROBOTIC ASSISTED LAPAROSCOPIC CHOLECYSTECTOMY
Anesthesia: General

## 2023-09-28 MED ORDER — METHYLPREDNISOLONE SODIUM SUCC 125 MG IJ SOLR: 125.0000 mg | Freq: Once | INTRAMUSCULAR | Status: DC | PRN

## 2023-09-28 MED ORDER — INDOCYANINE GREEN 25 MG IV SOLR: 2.5000 mg | INTRAVENOUS | Status: DC

## 2023-09-28 MED ORDER — DOPAMINE-DEXTROSE 3.2-5 MG/ML-% IV SOLN
INTRAVENOUS | Status: AC
  Filled 2023-09-28: qty 250

## 2023-09-28 MED ORDER — DIPHENHYDRAMINE HCL 50 MG/ML IJ SOLN: 50.0000 mg | Freq: Once | INTRAMUSCULAR | Status: DC | PRN

## 2023-09-28 MED ORDER — SODIUM CHLORIDE 0.9% FLUSH
3.0000 mL | Freq: Two times a day (BID) | INTRAVENOUS | Status: DC
  Administered 2023-09-28 – 2023-09-29 (×3): 3 mL via INTRAVENOUS

## 2023-09-28 MED ORDER — SODIUM CHLORIDE 0.9 % IV SOLN: INTRAVENOUS | Status: AC

## 2023-09-28 MED ORDER — SODIUM CHLORIDE 0.9% FLUSH: 3.0000 mL | INTRAVENOUS | Status: DC | PRN

## 2023-09-28 MED ORDER — GABAPENTIN 300 MG PO CAPS: 300.0000 mg | ORAL_CAPSULE | ORAL | Status: DC

## 2023-09-28 MED ORDER — PHENYLEPHRINE 80 MCG/ML (10ML) SYRINGE FOR IV PUSH (FOR BLOOD PRESSURE SUPPORT)
PREFILLED_SYRINGE | INTRAVENOUS | Status: AC
  Filled 2023-09-28: qty 10

## 2023-09-28 MED ORDER — SODIUM CHLORIDE 0.9 % IV SOLN: 250.0000 mL | INTRAVENOUS | Status: AC | PRN

## 2023-09-28 MED ORDER — CHLORHEXIDINE GLUCONATE CLOTH 2 % EX PADS: 6.0000 | MEDICATED_PAD | Freq: Once | CUTANEOUS | Status: DC

## 2023-09-28 MED ORDER — IODIXANOL 320 MG/ML IV SOLN
INTRAVENOUS | Status: DC | PRN
  Administered 2023-09-28: 65 mL via INTRA_ARTERIAL

## 2023-09-28 MED ORDER — HEPARIN SODIUM (PORCINE) 1000 UNIT/ML IJ SOLN
INTRAMUSCULAR | Status: AC
  Filled 2023-09-28: qty 10

## 2023-09-28 MED ORDER — FENTANYL CITRATE (PF) 100 MCG/2ML IJ SOLN
INTRAMUSCULAR | Status: DC | PRN
  Administered 2023-09-28: 25 ug via INTRAVENOUS
  Administered 2023-09-28: 50 ug via INTRAVENOUS

## 2023-09-28 MED ORDER — LIDOCAINE HCL (PF) 1 % IJ SOLN
INTRAMUSCULAR | Status: DC | PRN
  Administered 2023-09-28: 10 mL

## 2023-09-28 MED ORDER — MIDAZOLAM HCL 2 MG/ML PO SYRP: 8.0000 mg | ORAL_SOLUTION | Freq: Once | ORAL | Status: DC | PRN

## 2023-09-28 MED ORDER — MIDAZOLAM HCL 2 MG/2ML IJ SOLN
INTRAMUSCULAR | Status: DC | PRN
  Administered 2023-09-28: .5 mg via INTRAVENOUS
  Administered 2023-09-28 (×2): 1 mg via INTRAVENOUS

## 2023-09-28 MED ORDER — ATROPINE SULFATE 1 MG/10ML IJ SOSY
PREFILLED_SYRINGE | INTRAMUSCULAR | Status: AC
  Filled 2023-09-28: qty 20

## 2023-09-28 MED ORDER — SODIUM CHLORIDE 0.9 % IV SOLN: INTRAVENOUS | Status: DC

## 2023-09-28 MED ORDER — CEFAZOLIN SODIUM-DEXTROSE 2-4 GM/100ML-% IV SOLN: 2.0000 g | INTRAVENOUS | Status: DC

## 2023-09-28 MED ORDER — HEPARIN SODIUM (PORCINE) 1000 UNIT/ML IJ SOLN
INTRAMUSCULAR | Status: DC | PRN
  Administered 2023-09-28: 10000 [IU] via INTRAVENOUS

## 2023-09-28 MED ORDER — CEFAZOLIN SODIUM-DEXTROSE 2-4 GM/100ML-% IV SOLN
INTRAVENOUS | Status: AC
  Filled 2023-09-28: qty 100

## 2023-09-28 MED ORDER — FENTANYL CITRATE PF 50 MCG/ML IJ SOSY: 12.5000 ug | PREFILLED_SYRINGE | Freq: Once | INTRAMUSCULAR | Status: DC | PRN

## 2023-09-28 MED ORDER — HEPARIN (PORCINE) IN NACL 2000-0.9 UNIT/L-% IV SOLN
INTRAVENOUS | Status: DC | PRN
  Administered 2023-09-28: 1000 mL

## 2023-09-28 MED ORDER — PHENYLEPHRINE HCL-NACL 20-0.9 MG/250ML-% IV SOLN
INTRAVENOUS | Status: AC
  Filled 2023-09-28: qty 250

## 2023-09-28 MED ORDER — BUPIVACAINE LIPOSOME 1.3 % IJ SUSP: 20.0000 mL | Freq: Once | INTRAMUSCULAR | Status: DC

## 2023-09-28 MED ORDER — FAMOTIDINE 20 MG PO TABS: 40.0000 mg | ORAL_TABLET | Freq: Once | ORAL | Status: DC | PRN

## 2023-09-28 MED ORDER — ACETAMINOPHEN 500 MG PO TABS: 1000.0000 mg | ORAL_TABLET | ORAL | Status: DC

## 2023-09-28 MED ORDER — CEFAZOLIN SODIUM-DEXTROSE 2-4 GM/100ML-% IV SOLN
2.0000 g | INTRAVENOUS | Status: AC
  Administered 2023-09-28: 2 g via INTRAVENOUS
  Filled 2023-09-28: qty 100

## 2023-09-28 MED ORDER — MIDAZOLAM HCL 5 MG/5ML IJ SOLN
INTRAMUSCULAR | Status: AC
Start: 2023-09-28 — End: ?
  Filled 2023-09-28: qty 5

## 2023-09-28 MED ORDER — FENTANYL CITRATE (PF) 100 MCG/2ML IJ SOLN
INTRAMUSCULAR | Status: AC
  Filled 2023-09-28: qty 2

## 2023-09-28 SURGICAL SUPPLY — 18 items
CATH ANGIO 5F PIGTAIL 100CM (CATHETERS) IMPLANT
CATH BEACON 5 .035 100 H1 TIP (CATHETERS) IMPLANT
COVER DRAPE FLUORO 36X44 (DRAPES) IMPLANT
COVER PROBE ULTRASOUND 5X96 (MISCELLANEOUS) IMPLANT
DEVICE PRESTO INFLATION (MISCELLANEOUS) IMPLANT
DEVICE STARCLOSE SE CLOSURE (Vascular Products) IMPLANT
GLIDEWIRE ANGLED SS 035X260CM (WIRE) IMPLANT
KIT CAROTID MANIFOLD (MISCELLANEOUS) IMPLANT
NDL ENTRY 21GA 7CM ECHOTIP (NEEDLE) IMPLANT
NEEDLE ENTRY 21GA 7CM ECHOTIP (NEEDLE) ×1 IMPLANT
PACK ANGIOGRAPHY (CUSTOM PROCEDURE TRAY) ×1 IMPLANT
SET INTRO CAPELLA COAXIAL (SET/KITS/TRAYS/PACK) IMPLANT
SHEATH BRITE TIP 6FRX11 (SHEATH) IMPLANT
SUT MNCRL AB 4-0 PS2 18 (SUTURE) IMPLANT
SYR MEDRAD MARK 7 150ML (SYRINGE) IMPLANT
TUBING CONTRAST HIGH PRESS 72 (TUBING) IMPLANT
WIRE G VAS 035X260 STIFF (WIRE) IMPLANT
WIRE GUIDERIGHT .035X150 (WIRE) IMPLANT

## 2023-09-28 NOTE — Op Note (Signed)
[facility]   OPERATIVE NOTE  DATE: 09/22/2023 - 09/28/2023  PRE-OPERATIVE DIAGNOSIS:  1.  Bilateral carotid artery stenosis with acute left hemispheric stroke   POST-OPERATIVE DIAGNOSIS: Same as above  PROCEDURE: 1.   Ultrasound Guidance for vascular access right femoral artery 2.   Catheter placement into right common carotid artery and into left common carotid artery from right femoral approach 3.   Thoracic aortogram 4.   Cervical and cerebral bilateral carotid angiograms 5.   StarClose closure device right femoral artery  SURGEON: [name], MD  ASSISTANT(S): None  ANESTHESIA: Moderate conscious sedation  ESTIMATED BLOOD LOSS: Less than 10 cc  FLUORO TIME: 3.3 minutes  CONTRAST: 65 cc  MODERATE CONSCIOUS SEDATION TIME: Continuous ECG pulse oximetry and cardiopulmonary monitoring was performed throughout the entire procedure by the interventional radiology nurse.  Parenteral Versed and Fentanyl were administered.  Total conscious sedation time was 59 minutes  FINDING(S): 1.  30% calcific stenosis noted in the bifurcation on the left at the origin of the left ICA. 2.  30% calcific stenosis noted at the origin of the right internal carotid artery  SPECIMEN(S):  None  INDICATIONS:   Patient is a 72 y.o.male who presents with a symptomatic acute left hemispheric stroke.  CT angiogram suggested greater than 60% stenosis of the left internal carotid at its origin.  The risks and benefits of surgery and of stenting were reviewed with the patient and his family.  We elected to move forward with angiography and the possibility of stenting.  Risks and benefits are discussed and informed consent was obtained.  DESCRIPTION: After obtaining full informed written consent, the patient was brought back to the operating room and placed supine upon the vascular suite table.  After obtaining adequate anesthesia, the patient was prepped and draped in the  standard fashion.  Moderate conscious sedation was administered during a face to face encounter with the patient throughout the procedure with my supervision of the RN administering medicines and monitoring the patients vital signs and mental status throughout from the start of the procedure until the patient was taken to the recovery room. The right femoral artery was visualized with ultrasound and found to be calcific but patent. It was then accessed under direct ultrasound guidance without difficulty with a Seldinger needle. A J-wire and 5 French sheath were placed and a permanent image was recorded. The patient was given 10000 units of intravenous heparin. A pigtail catheter was placed into the ascending aorta and an LAO projection thoracic aortogram was performed. This showed type I arch with the origins of the great vessels widely patent.  I then selected a H1 catheter and cannulated the innominate artery advancing into the mid right common carotid artery. Cervical and cerebral carotid angiography were then performed on the right side. The intracranial flow was found to be normal, diffuse calcific disease was noted without hemodynamically significant stenosis or occlusions. The cervical carotid artery on the right was approximately 20 to 30% calcific stenosis noted no hemodynamically significant lesion. I then turned my attention back to the thoracic aorta and removed the catheter from the right side. I selectively cannulated the left common carotid artery without difficulty with a H1 catheter and advanced into the left common carotid artery. Selective imaging was then performed of the cervical and cerebral carotid artery on the left. Intracranial filling was normal minimal disease was noted no hemodynamically significant stenosis was identified. The cervical carotid artery was 30%. Multiple views were taken  in the cervical carotid artery bilaterally. At this point, we had imaging to plan our treatment and we  elected to terminate the procedure. The diagnostic catheter was removed. Oblique arteriogram was performed of the right femoral artery and StarClose closure device was deployed in usual fashion with excellent hemostatic result. The patient tolerated the procedure well and was taken to the recovery room in stable condition.  COMPLICATIONS: None  CONDITION: Stable   [name] 09/28/2023 9:08 AM   This note was created with Dragon Medical transcription system. Any errors in dictation are purely unintentional.

## 2023-09-28 NOTE — Plan of Care (Signed)
  Problem: Education: Goal: Knowledge of disease or condition will improve Outcome: Progressing Goal: Knowledge of secondary prevention will improve (MUST DOCUMENT ALL) Outcome: Progressing Goal: Knowledge of patient specific risk factors will improve Loraine Leriche N/A or DELETE if not current risk factor) Outcome: Progressing   Problem: Ischemic Stroke/TIA Tissue Perfusion: Goal: Complications of ischemic stroke/TIA will be minimized Outcome: Progressing   Problem: Coping: Goal: Will verbalize positive feelings about self Outcome: Progressing Goal: Will identify appropriate support needs Outcome: Progressing   Problem: Health Behavior/Discharge Planning: Goal: Ability to manage health-related needs will improve Outcome: Progressing Goal: Goals will be collaboratively established with patient/family Outcome: Progressing   Problem: Self-Care: Goal: Ability to participate in self-care as condition permits will improve Outcome: Progressing Goal: Verbalization of feelings and concerns over difficulty with self-care will improve Outcome: Progressing Goal: Ability to communicate needs accurately will improve Outcome: Progressing   Problem: Nutrition: Goal: Risk of aspiration will decrease Outcome: Progressing Goal: Dietary intake will improve Outcome: Progressing   Problem: Education: Goal: Ability to describe self-care measures that may prevent or decrease complications (Diabetes Survival Skills Education) will improve Outcome: Progressing Goal: Individualized Educational Video(s) Outcome: Progressing   Problem: Coping: Goal: Ability to adjust to condition or change in health will improve Outcome: Progressing   Problem: Fluid Volume: Goal: Ability to maintain a balanced intake and output will improve Outcome: Progressing   Problem: Health Behavior/Discharge Planning: Goal: Ability to identify and utilize available resources and services will improve Outcome:  Progressing Goal: Ability to manage health-related needs will improve Outcome: Progressing   Problem: Metabolic: Goal: Ability to maintain appropriate glucose levels will improve Outcome: Progressing   Problem: Nutritional: Goal: Maintenance of adequate nutrition will improve Outcome: Progressing Goal: Progress toward achieving an optimal weight will improve Outcome: Progressing   Problem: Skin Integrity: Goal: Risk for impaired skin integrity will decrease Outcome: Progressing   Problem: Tissue Perfusion: Goal: Adequacy of tissue perfusion will improve Outcome: Progressing   Problem: Education: Goal: Knowledge of General Education information will improve Description: Including pain rating scale, medication(s)/side effects and non-pharmacologic comfort measures Outcome: Progressing   Problem: Health Behavior/Discharge Planning: Goal: Ability to manage health-related needs will improve Outcome: Progressing   Problem: Clinical Measurements: Goal: Ability to maintain clinical measurements within normal limits will improve Outcome: Progressing Goal: Will remain free from infection Outcome: Progressing Goal: Diagnostic test results will improve Outcome: Progressing Goal: Respiratory complications will improve Outcome: Progressing Goal: Cardiovascular complication will be avoided Outcome: Progressing   Problem: Activity: Goal: Risk for activity intolerance will decrease Outcome: Progressing   Problem: Nutrition: Goal: Adequate nutrition will be maintained Outcome: Progressing   Problem: Coping: Goal: Level of anxiety will decrease Outcome: Progressing   Problem: Elimination: Goal: Will not experience complications related to bowel motility Outcome: Progressing Goal: Will not experience complications related to urinary retention Outcome: Progressing   Problem: Pain Management: Goal: General experience of comfort will improve Outcome: Progressing   Problem:  Safety: Goal: Ability to remain free from injury will improve Outcome: Progressing   Problem: Skin Integrity: Goal: Risk for impaired skin integrity will decrease Outcome: Progressing

## 2023-09-28 NOTE — Plan of Care (Signed)
Problem: Education: Goal: Knowledge of disease or condition will improve 09/28/2023 0131 by Jorge Ny, RN Outcome: Progressing 09/28/2023 0130 by Jorge Ny, RN Outcome: Progressing Goal: Knowledge of secondary prevention will improve (MUST DOCUMENT ALL) 09/28/2023 0131 by Jorge Ny, RN Outcome: Progressing 09/28/2023 0130 by Jorge Ny, RN Outcome: Progressing Goal: Knowledge of patient specific risk factors will improve Loraine Leriche N/A or DELETE if not current risk factor) 09/28/2023 0131 by Jorge Ny, RN Outcome: Progressing 09/28/2023 0130 by Jorge Ny, RN Outcome: Progressing   Problem: Ischemic Stroke/TIA Tissue Perfusion: Goal: Complications of ischemic stroke/TIA will be minimized 09/28/2023 0131 by Jorge Ny, RN Outcome: Progressing 09/28/2023 0130 by Jorge Ny, RN Outcome: Progressing   Problem: Coping: Goal: Will verbalize positive feelings about self 09/28/2023 0131 by Jorge Ny, RN Outcome: Progressing 09/28/2023 0130 by Jorge Ny, RN Outcome: Progressing Goal: Will identify appropriate support needs 09/28/2023 0131 by Jorge Ny, RN Outcome: Progressing 09/28/2023 0130 by Jorge Ny, RN Outcome: Progressing   Problem: Health Behavior/Discharge Planning: Goal: Ability to manage health-related needs will improve 09/28/2023 0131 by Jorge Ny, RN Outcome: Progressing 09/28/2023 0130 by Jorge Ny, RN Outcome: Progressing Goal: Goals will be collaboratively established with patient/family 09/28/2023 0131 by Jorge Ny, RN Outcome: Progressing 09/28/2023 0130 by Jorge Ny, RN Outcome: Progressing   Problem: Self-Care: Goal: Ability to participate in self-care as condition permits will improve 09/28/2023 0131 by Jorge Ny, RN Outcome: Progressing 09/28/2023 0130 by Jorge Ny, RN Outcome: Progressing Goal: Verbalization of feelings and  concerns over difficulty with self-care will improve 09/28/2023 0131 by Jorge Ny, RN Outcome: Progressing 09/28/2023 0130 by Jorge Ny, RN Outcome: Progressing Goal: Ability to communicate needs accurately will improve 09/28/2023 0131 by Jorge Ny, RN Outcome: Progressing 09/28/2023 0130 by Jorge Ny, RN Outcome: Progressing   Problem: Nutrition: Goal: Risk of aspiration will decrease 09/28/2023 0131 by Jorge Ny, RN Outcome: Progressing 09/28/2023 0130 by Jorge Ny, RN Outcome: Progressing Goal: Dietary intake will improve 09/28/2023 0131 by Jorge Ny, RN Outcome: Progressing 09/28/2023 0130 by Jorge Ny, RN Outcome: Progressing   Problem: Education: Goal: Ability to describe self-care measures that may prevent or decrease complications (Diabetes Survival Skills Education) will improve 09/28/2023 0131 by Jorge Ny, RN Outcome: Progressing 09/28/2023 0130 by Jorge Ny, RN Outcome: Progressing Goal: Individualized Educational Video(s) 09/28/2023 0131 by Jorge Ny, RN Outcome: Progressing 09/28/2023 0130 by Jorge Ny, RN Outcome: Progressing   Problem: Coping: Goal: Ability to adjust to condition or change in health will improve 09/28/2023 0131 by Jorge Ny, RN Outcome: Progressing 09/28/2023 0130 by Jorge Ny, RN Outcome: Progressing   Problem: Fluid Volume: Goal: Ability to maintain a balanced intake and output will improve 09/28/2023 0131 by Jorge Ny, RN Outcome: Progressing 09/28/2023 0130 by Jorge Ny, RN Outcome: Progressing   Problem: Health Behavior/Discharge Planning: Goal: Ability to identify and utilize available resources and services will improve 09/28/2023 0131 by Jorge Ny, RN Outcome: Progressing 09/28/2023 0130 by Jorge Ny, RN Outcome: Progressing Goal: Ability to manage health-related needs will improve 09/28/2023 0131  by Jorge Ny, RN Outcome: Progressing 09/28/2023 0130 by Jorge Ny, RN Outcome: Progressing   Problem: Metabolic: Goal: Ability to maintain appropriate glucose levels will improve 09/28/2023 0131 by Jorge Ny, RN Outcome: Progressing 09/28/2023 0130 by Jorge Ny, RN Outcome:  Progressing   Problem: Nutritional: Goal: Maintenance of adequate nutrition will improve 09/28/2023 0131 by Jorge Ny, RN Outcome: Progressing 09/28/2023 0130 by Jorge Ny, RN Outcome: Progressing Goal: Progress toward achieving an optimal weight will improve 09/28/2023 0131 by Jorge Ny, RN Outcome: Progressing 09/28/2023 0130 by Jorge Ny, RN Outcome: Progressing   Problem: Skin Integrity: Goal: Risk for impaired skin integrity will decrease 09/28/2023 0131 by Jorge Ny, RN Outcome: Progressing 09/28/2023 0130 by Jorge Ny, RN Outcome: Progressing   Problem: Tissue Perfusion: Goal: Adequacy of tissue perfusion will improve 09/28/2023 0131 by Jorge Ny, RN Outcome: Progressing 09/28/2023 0130 by Jorge Ny, RN Outcome: Progressing   Problem: Education: Goal: Knowledge of General Education information will improve Description: Including pain rating scale, medication(s)/side effects and non-pharmacologic comfort measures 09/28/2023 0131 by Jorge Ny, RN Outcome: Progressing 09/28/2023 0130 by Jorge Ny, RN Outcome: Progressing   Problem: Health Behavior/Discharge Planning: Goal: Ability to manage health-related needs will improve 09/28/2023 0131 by Jorge Ny, RN Outcome: Progressing 09/28/2023 0130 by Jorge Ny, RN Outcome: Progressing   Problem: Clinical Measurements: Goal: Ability to maintain clinical measurements within normal limits will improve 09/28/2023 0131 by Jorge Ny, RN Outcome: Progressing 09/28/2023 0130 by Jorge Ny, RN Outcome: Progressing Goal:  Will remain free from infection 09/28/2023 0131 by Jorge Ny, RN Outcome: Progressing 09/28/2023 0130 by Jorge Ny, RN Outcome: Progressing Goal: Diagnostic test results will improve 09/28/2023 0131 by Jorge Ny, RN Outcome: Progressing 09/28/2023 0130 by Jorge Ny, RN Outcome: Progressing Goal: Respiratory complications will improve 09/28/2023 0131 by Jorge Ny, RN Outcome: Progressing 09/28/2023 0130 by Jorge Ny, RN Outcome: Progressing Goal: Cardiovascular complication will be avoided 09/28/2023 0131 by Jorge Ny, RN Outcome: Progressing 09/28/2023 0130 by Jorge Ny, RN Outcome: Progressing   Problem: Activity: Goal: Risk for activity intolerance will decrease 09/28/2023 0131 by Jorge Ny, RN Outcome: Progressing 09/28/2023 0130 by Jorge Ny, RN Outcome: Progressing   Problem: Nutrition: Goal: Adequate nutrition will be maintained 09/28/2023 0131 by Jorge Ny, RN Outcome: Progressing 09/28/2023 0130 by Jorge Ny, RN Outcome: Progressing   Problem: Coping: Goal: Level of anxiety will decrease 09/28/2023 0131 by Jorge Ny, RN Outcome: Progressing 09/28/2023 0130 by Jorge Ny, RN Outcome: Progressing   Problem: Elimination: Goal: Will not experience complications related to bowel motility 09/28/2023 0131 by Jorge Ny, RN Outcome: Progressing 09/28/2023 0130 by Jorge Ny, RN Outcome: Progressing Goal: Will not experience complications related to urinary retention 09/28/2023 0131 by Jorge Ny, RN Outcome: Progressing 09/28/2023 0130 by Jorge Ny, RN Outcome: Progressing   Problem: Pain Management: Goal: General experience of comfort will improve 09/28/2023 0131 by Jorge Ny, RN Outcome: Progressing 09/28/2023 0130 by Jorge Ny, RN Outcome: Progressing   Problem: Safety: Goal: Ability to remain free from injury will  improve 09/28/2023 0131 by Jorge Ny, RN Outcome: Progressing 09/28/2023 0130 by Jorge Ny, RN Outcome: Progressing   Problem: Skin Integrity: Goal: Risk for impaired skin integrity will decrease 09/28/2023 0131 by Jorge Ny, RN Outcome: Progressing 09/28/2023 0130 by Jorge Ny, RN Outcome: Progressing

## 2023-09-28 NOTE — Progress Notes (Signed)
PT Cancellation Note  Patient Details Name: Alexander [name] MRN: [id number] DOB: Aug 25, 1951   Cancelled Treatment:    Reason Eval/Treat Not Completed: Patient at procedure or test/unavailable.  Pt currently off floor for procedure.  Will re-attempt PT session at a later date/time.  Hendricks Limes, PT 09/28/23, 11:01 AM

## 2023-09-28 NOTE — Interval H&P Note (Signed)
History and Physical Interval Note:  09/28/2023 8:03 AM  Alexander [name]  has presented today for surgery, with the diagnosis of Left Carotid Stenosis.  The various methods of treatment have been discussed with the patient and family. After consideration of risks, benefits and other options for treatment, the patient has consented to  Procedure(s): CAROTID PTA/STENT INTERVENTION (Left) as a surgical intervention.  The patient's history has been reviewed, patient examined, no change in status, stable for surgery.  I have reviewed the patient's chart and labs.  Questions were answered to the patient's satisfaction.     [name]

## 2023-09-28 NOTE — Progress Notes (Signed)
[facility] Progress Note  Patient: Alexander [name]    ZOX:[id number]  DOA: 09/22/2023     Date of Service: the patient was seen and examined on 09/28/2023  Chief Complaint  Patient presents with   hand numbness   Brief hospital course: [name] is a 47 male with history of hyperlipidemia, possible history of TIA, insulin-dependent diabetes melitis type II, hypertension, history of acute cholecystitis, who presents emergency department for chief concerns of right hand numbness for 2 to 3 weeks. It has gradually worsened, prompting him to present to the emergency department for further evaluation as he is supposed to have cholecystectomy next week. He wanted to figure this out before the surgery.    Hospital course / significant events:  11/29: admitted to hospitalist service for concern for CVA 11/30: (+)Carotid disease on CTA 12/01: Vascular consult - possible for carotid endarterectomy, will discuss tomorrow (today is Sunday). Echo also showing worsening EF 20-25% and grade 2 diastolic dysfunction w/ global hypokinesis - will need cardiology clearance prior to anesthesia 12/02: vascular planning intervention on carotid stenosis pending cardiology eval and gen surg recs re: chole 12/03: stress test today no concerns which would preclude surgery.    Consultants:  Neurology Vascular surgery  Cardiology  General Surgery   Assessment and Plan:  # Stroke Left frontal and parietal convexity with scattered foci, concerning for left-sided emboli or watershed infarct. Largest infarct involving the left postcentral gyrus, can account for the right hand numbness. Was on ASA daily prior to this event  Continue Plavix 75 mg daily + aspirin 81 mg daily    # Carotid artery stenosis, s/p angiogram, stenosis less than 30% bilaterally vascular surgery consulted, for carotid stenosis, stent vs endarterectomy, but pt was found to have a stenosis less than 30% bilaterally so no procedure was  done.    Chronic HFrEF Ischemic cardiomyopathy  Echo showing worsening EF but does not appear to be in exacerbation at this time  needed cardiology clearance prior to anesthesia Diuresis as needed Cardiology consulted s/p NM cardiac stress test --> no concerns, (+)old ischemia, clear to proceed w/ surgery    12/4 episode of nonsustained V. tach 7 beats, patient was sleeping and asymptomatic Cardiology eval, recommended to start CPAP for possibility of OSA  Acute cholecystitis Drain is in place since Sep 2024 asper pt General surgery consult - carotid intervention takes priority, continue drain care and plan drain exchange  Elective surgery initially planned for this week has been postponed    Metabolic acidosis - resolved Sodium bicarbonate 650 mg p.o. twice daily, 2 doses ordered on admission Monitor BMP    Hypertension Home Coreg 1.6 mg p.o. twice daily with meals resumed   Insulin dependent type 2 diabetes mellitus (HCC) A1c was 6.7 on 07/29/23 Insulin SSI with at bedtime coverage ordered   Leukocytosis Mild leukocytosis, suspect reactive in setting of stroke Monitor periodic CBC   UTI, UA positive WBC 11.2---23.7 significantly elevated on 12/4 12/4 started empiric antibiotics ceftriaxone 2 g IV daily  Procalcitonin negative <0.01 Follow urine culture, growing GNR, follow complete report and sensitivity report WBC 13K trending down   Hyperlipidemia Continue atorvastatin 80 mg daily    Body mass index is 30.2 kg/m.  Interventions:  Diet: Heart healthy/carb modified DVT Prophylaxis: Subcutaneous Lovenox   Advance goals of care discussion: Full code  Family Communication: family was not present at bedside, at the time of interview.  The pt provided permission to discuss medical plan with  the family. Opportunity was given to ask question and all questions were answered satisfactorily.   Disposition:  Pt is from Home, admitted with acute CVA and left carotid  stenosis, scheduled by vascular surgery for left CEA tomorrow a.m., which precludes a safe discharge. Discharge to home, when stable and cleared by vascular surgery, most likely in 1 to 2 days.  Subjective: No significant events overnight, patient was seen after angiogram done for possible carotid stenosis but it was not done.  Patient tolerated procedure well, patient was resting comfortably, denied any new neurological findings. We will continue monitor today and plan for discharge tomorrow a.m.  Physical Exam: General: NAD, lying comfortably Appear in no distress, affect appropriate Eyes: PERRLA ENT: Oral Mucosa Clear, moist  Neck: no JVD,  Cardiovascular: S1 and S2 Present, no Murmur,  Respiratory: good respiratory effort, Bilateral Air entry equal and Decreased, no Crackles, no wheezes Abdomen: Bowel Sound present, Soft and no tenderness, cholecystotomy tube intact Skin: no rashes Extremities: no Pedal edema, no calf tenderness Neurologic: without any new focal findings Gait not checked due to patient safety concerns  Vitals:   09/28/23 1030 09/28/23 1045 09/28/23 1100 09/28/23 1300  BP: (!) 100/53  107/60 114/62  Pulse: 76 75 77 84  Resp: (!) 25 15 14 14   Temp:    98 F (36.7 C)  TempSrc:    Oral  SpO2: 99% 98% 99% 98%  Weight:      Height:        Intake/Output Summary (Last 24 hours) at 09/28/2023 1500 Last data filed at 09/28/2023 1206 Gross per 24 hour  Intake 546.41 ml  Output 180 ml  Net 366.41 ml   Filed Weights   09/22/23 1433 09/22/23 1839 09/23/23 2023  Weight: 101.6 kg 101.6 kg 101 kg    Data Reviewed: I have personally reviewed and interpreted daily labs, tele strips, imagings as discussed above. I reviewed all nursing notes, pharmacy notes, vitals, pertinent old records I have discussed plan of care as described above with RN and patient/family.  CBC: Recent Labs  Lab 09/22/23 1558 09/27/23 0424 09/28/23 0528  WBC 11.2* 23.7* 13.0*  NEUTROABS  8.1*  --   --   HGB 13.0 11.8* 12.4*  HCT 40.2 35.1* 37.7*  MCV 91.0 87.5 90.8  PLT 323 339 360   Basic Metabolic Panel: Recent Labs  Lab 09/24/23 0407 09/25/23 0552 09/26/23 0421 09/27/23 0424 09/28/23 0528  NA 138 136 135 133* 136  K 3.6 3.7 3.7 3.7 3.9  CL 105 103 103 102 102  CO2 22 24 25 23 24   GLUCOSE 167* 118* 120* 163* 124*  BUN 20 20 21 18 20   CREATININE 0.49* 0.59* 0.57* 0.66 0.69  CALCIUM 8.9 8.6* 8.4* 8.6* 8.8*  MG 2.1 2.0 2.1 1.8 2.0  PHOS  --   --   --  3.2 3.3    Studies: PERIPHERAL VASCULAR CATHETERIZATION  Result Date: 09/28/2023 See surgical note for result.   Scheduled Meds:  aspirin EC  81 mg Oral Daily   atorvastatin  80 mg Oral QHS   carvedilol  1.5625 mg Oral BID WC   clopidogrel  75 mg Oral Daily   enoxaparin (LOVENOX) injection  0.5 mg/kg Subcutaneous Q24H   insulin aspart  0-15 Units Subcutaneous TID WC   insulin aspart  0-5 Units Subcutaneous QHS   insulin glargine-yfgn  20 Units Subcutaneous QHS   sodium chloride flush  3 mL Intravenous Q12H   sodium chloride flush  5 mL Intracatheter Daily   Continuous Infusions:  sodium chloride     cefTRIAXone (ROCEPHIN)  IV 2 g (09/27/23 1342)   PRN Meds: sodium chloride, acetaminophen **OR** acetaminophen (TYLENOL) oral liquid 160 mg/5 mL **OR** acetaminophen, bisacodyl, senna-docusate, sodium chloride flush  Time spent: 35 minutes  Author: Gillis Santa. MD [facility] 09/28/2023 3:00 PM  To reach On-call, see care teams to locate the attending and reach out to them via [email or web address]. If 7PM-7AM, please contact night-coverage If you still have difficulty reaching the attending provider, please page the [facility] (Director on Call) for [facility] on amion for assistance.

## 2023-09-29 ENCOUNTER — Encounter: Payer: Self-pay | Admitting: Vascular Surgery

## 2023-09-29 ENCOUNTER — Other Ambulatory Visit (HOSPITAL_COMMUNITY): Payer: Self-pay

## 2023-09-29 DIAGNOSIS — R2990 Unspecified symptoms and signs involving the nervous system: Secondary | ICD-10-CM | POA: Diagnosis not present

## 2023-09-29 LAB — CBC
HCT: 38 % — ABNORMAL LOW (ref 39.0–52.0)
Hemoglobin: 12.5 g/dL — ABNORMAL LOW (ref 13.0–17.0)
MCH: 29.8 pg (ref 26.0–34.0)
MCHC: 32.9 g/dL (ref 30.0–36.0)
MCV: 90.5 fL (ref 80.0–100.0)
Platelets: 334 10*3/uL (ref 150–400)
RBC: 4.2 MIL/uL — ABNORMAL LOW (ref 4.22–5.81)
RDW: 15.5 % (ref 11.5–15.5)
WBC: 8.1 10*3/uL (ref 4.0–10.5)
nRBC: 0 % (ref 0.0–0.2)

## 2023-09-29 LAB — URINE CULTURE: Culture: >=100000 — AB

## 2023-09-29 LAB — BASIC METABOLIC PANEL
Anion gap: 10 (ref 5–15)
BUN: 19 mg/dL (ref 8–23)
CO2: 23 mmol/L (ref 22–32)
Calcium: 8.5 mg/dL — ABNORMAL LOW (ref 8.9–10.3)
Chloride: 104 mmol/L (ref 98–111)
Creatinine, Ser: 0.61 mg/dL (ref 0.61–1.24)
GFR, Estimated: >60 mL/min (ref >60)
Glucose, Bld: 153 mg/dL — ABNORMAL HIGH (ref 70–99)
Potassium: 3.9 mmol/L (ref 3.5–5.1)
Sodium: 137 mmol/L (ref 135–145)

## 2023-09-29 LAB — PHOSPHORUS: Phosphorus: 3.3 mg/dL (ref 2.5–4.6)

## 2023-09-29 LAB — MAGNESIUM: Magnesium: 2 mg/dL (ref 1.7–2.4)

## 2023-09-29 LAB — GLUCOSE, CAPILLARY: Glucose-Capillary: 136 mg/dL — ABNORMAL HIGH (ref 70–99)

## 2023-09-29 MED ORDER — ASPIRIN EC 81 MG PO TBEC: 81.0000 mg | DELAYED_RELEASE_TABLET | Freq: Every day | ORAL | 11 refills | Status: AC

## 2023-09-29 MED ORDER — CEPHALEXIN 500 MG PO CAPS: 500.0000 mg | ORAL_CAPSULE | Freq: Two times a day (BID) | ORAL | Status: DC

## 2023-09-29 MED ORDER — CLOPIDOGREL BISULFATE 75 MG PO TABS: 75.0000 mg | ORAL_TABLET | Freq: Every day | ORAL | 1 refills | Status: AC

## 2023-09-29 MED ORDER — CEPHALEXIN 500 MG PO CAPS: 500.0000 mg | ORAL_CAPSULE | Freq: Two times a day (BID) | ORAL | 0 refills | Status: AC

## 2023-09-29 MED ORDER — SACUBITRIL-VALSARTAN 24-26 MG PO TABS: 1.0000 | ORAL_TABLET | Freq: Two times a day (BID) | ORAL | 11 refills | Status: AC

## 2023-09-29 NOTE — Progress Notes (Signed)
09/29/23  Patient is feeling better today.  No stent or other procedure was needed yesterday as carotid angiogram only showed about 30% stenosis bilaterally.  Discussed with patient about option to manage his gallbladder with interventional radiology.  Discussed with Dr. Milford Cage with IR and he will see the patient in outpatient setting to assess/evaluate for possible Spyglass procedure.  This may be able to clear his cholelithiasis so no surgery would be needed.  Particularly with now dual antiplatelet, stroke, and much decrease cardiac function, he is not a good surgical candidate at this point for cholecystectomy.  Will set up follow up with me in a month to check on his progress.  Henrene Dodge, MD

## 2023-09-29 NOTE — Progress Notes (Signed)
PT Cancellation Note  Patient Details Name: Alexander [name] MRN: [id number] DOB: May 29, 1951   Cancelled Treatment:    Reason Eval/Treat Not Completed: Other (comment): Upon PT arrival, patient being transported by volunteer services for discharge from hospital. Will discharge at this time.    Creed Copper Fairly, PT, DPT 09/29/23 1:24 PM

## 2023-09-29 NOTE — TOC Benefit Eligibility Note (Signed)
Pharmacy Patient Advocate Encounter  Insurance verification completed.    The patient is insured through  [employer] Part D    Ran test claim for [employer]. Currently a quantity of 60 is a 30 day supply and the co-pay is $0.00 .  Ran test claim for [employer]. Currently a quantity of 30 is a 30 day supply and the co-pay is $0.00 .  Ran test claim for Jardiance. Currently a quantity of 30 is a 30 day supply and the co-pay is $0.00 .  This test claim was processed through [facility]- copay amounts may vary at other pharmacies due to pharmacy/plan contracts, or as the patient moves through the different stages of their insurance plan.

## 2023-09-29 NOTE — Progress Notes (Signed)
Heart Failure Stewardship Pharmacy Note  PCP: Myrene Buddy, NP PCP-Cardiologist: None  HPI: Alexander [name] is a 72 y.o. male with hyperlipidemia, possible history of TIA, insulin-dependent diabetes melitis type II, hypertension, history of acute cholecystitis with a current drain who presented with gradual worsening of right hand numbness. MR brain on 09/22/23 showed  scattered foci of acute/subacute infarction over the left frontal and parietal convexity in a pattern that may represent left-sided emboli or a watershed infarct. The largest infarct involved the left postcentral gyrus, likely accounting for the right hand numbness. CT head on 09/26/23 ntoed no acute abnormalities. Based on CTA head/neck on 09/22/23 showing 60% bilateral carotid stenosis, patient was taken for vascular intervention. During intervention, carotid stenosis was noted to be less than previously expected and no intervention was performed. Echo with bubble study was performed on 09/13/23 showing LVEF of 20-25% with grade II diastolic dysfunction, moderately reduced RV function, and mild MR. Bubble study was negative. CMRI was performed on 09/26/23 showing no current evidence of ischemia, but evidence of old apical infarction. 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  Pertinent Lab Values: Creatinine, Ser  Date Value Ref Range Status  09/28/2023 0.69 0.61 - 1.24 mg/dL Final   BUN  Date Value Ref Range Status  09/28/2023 20 8 - 23 mg/dL Final   Potassium  Date Value Ref Range Status  09/28/2023 3.9 3.5 - 5.1 mmol/L Final   Sodium  Date Value Ref Range Status  09/28/2023 136 135 - 145 mmol/L Final   Magnesium  Date Value Ref Range Status  09/28/2023 2.0 1.7 - 2.4 mg/dL Final    Comment:    Performed at [facility], [address] [place], [place] [address]   Hgb A1c MFr Bld  Date Value Ref Range Status  07/29/2023 6.7 (H) 4.8 -  5.6 % Final    Comment:    (NOTE) Pre diabetes:          5.7%-6.4%  Diabetes:              >6.4%  Glycemic control for   <7.0% adults with diabetes     Vital Signs: Admission weight: 224 lbs Temp:  [97.9 F (36.6 C)-98.4 F (36.9 C)] 97.9 F (36.6 C) (12/06 0601) Pulse Rate:  [0-88] 76 (12/06 0601) Cardiac Rhythm: Normal sinus rhythm (12/05 1913) Resp:  [9-25] 20 (12/06 0601) BP: (100-131)/(53-91) 118/76 (12/06 0601) SpO2:  [96 %-100 %] 98 % (12/06 0601)  Intake/Output Summary (Last 24 hours) at 09/29/2023 0726 Last data filed at 09/28/2023 1619 Gross per 24 hour  Intake 279.33 ml  Output 175 ml  Net 104.33 ml   Current Heart Failure Medications:  Loop diuretic: none Beta-Blocker: carvedilol 1.6 mg bid ACEI/ARB/ARNI: none MRA: none SGLT2i: none  Prior to admission Heart Failure Medications:  Loop diuretic: none Beta-Blocker: carvedilol 3.125 mg bid ACEI/ARB/ARNI: Entresto 24-26 mg bid (not taking since September) MRA: none SGLT2i: Jardiance 25 mg daily Other: Ozempic  Assessment: 1. Acute systolic heart failure (LVEF 22%) with grade II diastolic dysfunction, due to ICM. NYHA class II symptoms.  -Symptoms: Reports feeling at baseline.  -Volume: Appears euvolemic on exam without need for diuretics. -Hemodynamics: BP is WNL. HR is 70-80s. -BB: Can only tolerate carvedilol at 1.6 mg bid. Increasing further previously led to hypotension and bradycardia.  -ACEI/ARB/ARNI: Can consider resuming Entresto. BP has been stable WNL and will likely improve with discharge  as he will be come more active. -MRA: Can consider adding -SGLT2i: Recommend holding for now with current UTI.  Plan: 1) Medication changes recommended at this time: -Consider restarting home Entresto on discharge. Will need a new refill as he ran out. -Consider adding spironolactone 12.5 mg daily in clinic -Consider holding Jardiance on discharge with UTI until clinic follow-up  2) Patient  assistance: -Copays for Sherryll Burger and Jardiance are $0  3) Education: - Patient has been educated on current HF medications and potential additions to HF medication regimen - Patient verbalizes understanding that over the next few months, these medication doses may change and more medications may be added to optimize HF regimen - Patient has been educated on basic disease state pathophysiology and goals of therapy  Medication Assistance / Insurance Benefits Check: Does the patient have prescription insurance?    Type of insurance plan:  Does the patient qualify for medication assistance through manufacturers or grants? No   Outpatient Pharmacy: Prior to admission outpatient pharmacy: [facility]     Please do not hesitate to reach out with questions or concerns,  Enos Fling, PharmD, CPP, BCPS Heart Failure Pharmacist  Phone - [phone] 09/29/2023 10:25 AM

## 2023-09-29 NOTE — Progress Notes (Signed)
Education Assessment and Provision:  Detailed education and instructions provided on heart failure disease management including the following:  Signs and symptoms of Heart Failure When to call the physician Importance of daily weights Low sodium diet Fluid restriction Medication management Anticipated future follow-up appointments-Pt will follow up with [facility] after discharge.  Patient education given on each of the above topics.  Patient acknowledges understanding via teach back method and acceptance of all instructions.  Education Materials:  "Living Better With Heart Failure" handout HF zone tool, & Daily Weight Tracker Tool.  Patient has scale at home: Yes Patient has pill box at home: Yes    Roxy Horseman, RN, BSN [facility] Heart Failure Navigator Secure Chat Only

## 2023-09-29 NOTE — Progress Notes (Signed)
This RN provided discharge instructions and teaching to the patient and the patient's significant other. Both parties verbalized and demonstrated an understanding of provided instructions. All outstanding questions resolved. L arm PIV removed. Cannula intact. Pt tolerated well. All belongings packed and in tow. Volunteer services requested to facilitate departure via wheelchair to private vehicle.

## 2023-09-29 NOTE — Discharge Summary (Signed)
[facility] Discharge Summary   Patient: Alexander [name] WUJ:[id number]  PCP: Myrene Buddy, NP  Date of admission: 09/22/2023   Date of discharge:  09/29/2023     Discharge Diagnoses:  Principal Problem:   Stroke-like symptom Active Problems:   Acute cholecystitis   Hypertension   Metabolic acidosis   Insulin dependent type 2 diabetes mellitus (HCC)   Truncal obesity   Cardiomyopathy, dilated (HCC)   Microalbuminuria due to type 2 diabetes mellitus (HCC)   Polyneuropathy   Cerebrovascular accident (CVA) (HCC)   Hyperlipidemia   Leukocytosis   CVA (cerebral vascular accident) (HCC)   Admitted From: Home Disposition:  Home HH  Recommendations for Outpatient Follow-up:  Follow-up with PCP in 1 week Follow-up with cardiology in 1 to 2 weeks, discontinued Jardiance and resumed Entresto, patient needs to follow with cardiology for further management, consider adding spironolactone if blood pressure improves and resume Jardiance after UTI treatment Follow with neurology in 1 to 2 weeks Follow-up with general surgery and IR in 1 to 2 weeks Follow-up with vascular surgery in 1 to 2 weeks Follow up LABS/TEST: Monitor BP at home and follow with PCP and cardiology to titrate medications accordingly   Follow-up Information     Alluri, Meryl Dare, MD. Go in 2 week(s).   Specialty: Cardiology Contact information: [address] [place] [address] [phone]         Gilda Crease, Latina Craver, MD Follow up in 3 month(s).   Specialties: Vascular Surgery, Cardiology, Radiology, Vascular Surgery Why: U/S bilat Carotids. Contact information: [address] [place] [address] [phone]         Myrene Buddy, NP Follow up in 1 week(s).   Specialty: Internal Medicine Contact information: [address] [facility] [place] [address] [phone]                Diet recommendation: Cardiac and  Carb modified diet  Activity: The patient is advised to gradually reintroduce usual activities, as tolerated  Discharge Condition: stable  Code Status: Full code   History of present illness: As per the H and P dictated on admission Hospital Course:  [name] is a 64 male with history of hyperlipidemia, possible history of TIA, insulin-dependent diabetes melitis type II, hypertension, history of acute cholecystitis, who presents emergency department for chief concerns of right hand numbness for 2 to 3 weeks. It has gradually worsened, prompting him to present to the emergency department for further evaluation as he is supposed to have cholecystectomy next week. He wanted to figure this out before the surgery.    Hospital course / significant events:  11/29: admitted to hospitalist service for concern for CVA 11/30: (+)Carotid disease on CTA 12/01: Vascular consult - possible for carotid endarterectomy, will discuss tomorrow (today is Sunday). Echo also showing worsening EF 20-25% and grade 2 diastolic dysfunction w/ global hypokinesis - will need cardiology clearance prior to anesthesia 12/02: vascular planning intervention on carotid stenosis pending cardiology eval and gen surg recs re: chole 12/03: stress test today no concerns which would preclude surgery.      Assessment and Plan:  # Stroke Left frontal and parietal convexity with scattered foci, concerning for left-sided emboli or watershed infarct. Largest infarct involving the left postcentral gyrus, can account for the right hand numbness. Was on ASA daily prior to this event. Continue Plavix 75 mg daily + aspirin 81 mg daily.  Follow with neurology  in 1 to 2 weeks for further management as an outpatient # Carotid artery stenosis, s/p angiogram, stenosis less than 30% bilaterally vascular surgery consulted, for carotid stenosis, stent vs endarterectomy, but pt was found to have a stenosis less than 30% bilaterally so no  procedure was done. # Chronic HFrEF, Ischemic cardiomyopathy:   Echo showing worsening EF but does not appear to be in exacerbation at this time  needed cardiology clearance prior to anesthesia. Diuresis as needed Cardiology consulted s/p NM cardiac stress test --> no concerns, (+)old ischemia, clear to proceed w/ surgery. On 12/4 episode of nonsustained V. tach 7 beats, patient was sleeping and asymptomatic Cardiology eval, recommended to start CPAP for possibility of OSA Resumed home dose Entresto on discharge # Acute cholecystitis: Drain is in place since Sep 2024 asper pt. General surgery consult - carotid intervention takes priority, continue drain care and plan drain exchange. Elective surgery initially planned for this week has been postponed.  Follow-up with general surgery as an outpatient. # Metabolic acidosis - resolved, s/p bicarbonate 650 mg p.o. twice daily # Hypertension: Continued Coreg 3.125 mg p.o. twice daily Home dose # Insulin dependent type 2 diabetes mellitus: A1c was 6.7 on 07/29/23, s/p NovoLog sliding scale.  Resumed home regimen of Lantus and Ozempic on discharge.  Monitor CBG at home, continue diabetic diet and follow with PCP.  # UTI, UA positive: WBC 11.2---23.7 significantly elevated on 12/4 12/4 started empiric antibiotics ceftriaxone 2 g IV daily. Procalcitonin negative <0.01 urine culture grew E. coli >100 K, sensitive to cephalosporin, resistant to ampicillin, and Bactrim, intermediate to ampicillin/sulbactam.  So patient was discharged on Keflex 500 mg p.o. twice daily for 4 days.  # Hyperlipidemia: Continue atorvastatin 80 mg daily  Body mass index is 30.2 kg/m.  Nutrition Interventions:  - Patient was instructed, not to drive, operate heavy machinery, perform activities at heights, swimming or participation in water activities or provide baby sitting services while on Pain, Sleep and Anxiety Medications; until his outpatient Physician has advised to do so  again.  - Also recommended to not to take more than prescribed Pain, Sleep and Anxiety Medications.  Patient was seen by physical therapy, who recommended Home health, which was arranged. On the day of the discharge the patient's vitals were stable, and no other acute medical condition were reported by patient. the patient was felt safe to be discharge at Home with Home health.  Consultants:  Neurology Vascular surgery  Cardiology  General Surgery  Procedures: s/p carotid angiogram, found to have less than 30% stenosis bilaterally, no procedure was done.  Discharge Exam: General: Appear in no distress, no Rash; Oral Mucosa Clear, moist. Cardiovascular: S1 and S2 Present, no Murmur, Respiratory: normal respiratory effort, Bilateral Air entry present and no Crackles, no wheezes Abdomen: Bowel Sound present, Soft and no tenderness, no hernia Extremities: no Pedal edema, no calf tenderness Neurology: CN grossly intact, mild right-sided weakness due to stroke.  affect appropriate.  Filed Weights   09/22/23 1433 09/22/23 1839 09/23/23 2023  Weight: 101.6 kg 101.6 kg 101 kg   Vitals:   09/29/23 0601 09/29/23 0746  BP: 118/76 113/76  Pulse: 76 79  Resp: 20 17  Temp: 97.9 F (36.6 C) 97.6 F (36.4 C)  SpO2: 98% 99%    DISCHARGE MEDICATION: Allergies as of 09/29/2023   No Known Allergies      Medication List     STOP taking these medications    empagliflozin 25 MG Tabs tablet Commonly  known as: JARDIANCE       TAKE these medications    acetaminophen 500 MG tablet Commonly known as: TYLENOL Take 1,000 mg by mouth every 6 (six) hours as needed for mild pain or headache.   aspirin EC 81 MG tablet Take 1 tablet (81 mg total) by mouth daily.   atorvastatin 80 MG tablet Commonly known as: LIPITOR Take 1 tablet (80 mg total) by mouth daily. Hold until you see your doctor and have your liver enzymes repeated   carvedilol 3.125 MG tablet Commonly known as: COREG Take  3.125 mg by mouth 2 (two) times daily with a meal.   cephALEXin 500 MG capsule Commonly known as: KEFLEX Take 1 capsule (500 mg total) by mouth every 12 (twelve) hours for 4 days. Start taking on: September 30, 2023   clopidogrel 75 MG tablet Commonly known as: PLAVIX Take 1 tablet (75 mg total) by mouth daily. Start taking on: September 30, 2023   feeding supplement Liqd Take 237 mLs by mouth 2 (two) times daily between meals.   Lantus SoloStar 100 UNIT/ML Solostar Pen Generic drug: insulin glargine Inject 38 Units into the skin at bedtime.   Ozempic (2 MG/DOSE) 8 MG/3ML Sopn Generic drug: Semaglutide (2 MG/DOSE) Inject 2 mg into the skin every 7 (seven) days. Sundays   sacubitril-valsartan 24-26 MG Commonly known as: ENTRESTO Take 1 tablet by mouth every 12 (twelve) hours.   sodium chloride flush 0.9 % Soln Commonly known as: NS 5 mLs by Intracatheter route daily.       No Known Allergies Discharge Instructions     Ambulatory referral to Neurology   Complete by: As directed    An appointment is requested in approximately: 2 weeks   Call MD for:   Complete by: As directed    Any new neurological changes   Call MD for:  difficulty breathing, headache or visual disturbances   Complete by: As directed    Call MD for:  extreme fatigue   Complete by: As directed    Call MD for:  persistant dizziness or light-headedness   Complete by: As directed    Call MD for:  persistant nausea and vomiting   Complete by: As directed    Call MD for:  severe uncontrolled pain   Complete by: As directed    Call MD for:  temperature >100.4   Complete by: As directed    Diet - low sodium heart healthy   Complete by: As directed    Diet Carb Modified   Complete by: As directed    Discharge instructions   Complete by: As directed    Follow-up with PCP in 1 week Follow-up with cardiology in 1 to 2 weeks, discontinued Jardiance and resumed Entresto, patient needs to follow with  cardiology for further management, consider adding spironolactone if blood pressure improves and resume Jardiance after UTI treatment Follow with neurology in 1 to 2 weeks Follow-up with general surgery and IR in 1 to 2 weeks Follow-up with vascular surgery in 1 to 2 weeks   Increase activity slowly   Complete by: As directed        The results of significant diagnostics from this hospitalization (including imaging, microbiology, ancillary and laboratory) are listed below for reference.    Significant Diagnostic Studies: PERIPHERAL VASCULAR CATHETERIZATION  Result Date: 09/28/2023 See surgical note for result.  CT HEAD CODE STROKE WO CONTRAST`  Result Date: 09/26/2023 CLINICAL DATA:  Code stroke.  Acute neurologic deficit EXAM:  CT HEAD WITHOUT CONTRAST TECHNIQUE: Contiguous axial images were obtained from the base of the skull through the vertex without intravenous contrast. RADIATION DOSE REDUCTION: This exam was performed according to the departmental dose-optimization program which includes automated exposure control, adjustment of the mA and/or kV according to patient size and/or use of iterative reconstruction technique. COMPARISON:  None Available. FINDINGS: Brain: There is no mass, hemorrhage or extra-axial collection. Mild volume loss. Mild white matter hypoattenuation compatible with chronic small vessel disease. Vascular: No abnormal hyperdensity of the major intracranial arteries or dural venous sinuses. No intracranial atherosclerosis. Skull: The visualized skull base, calvarium and extracranial soft tissues are normal. Sinuses/Orbits: No fluid levels or advanced mucosal thickening of the visualized paranasal sinuses. No mastoid or middle ear effusion. The orbits are normal. ASPECTS [facility] Stroke Program Early CT Score) - Ganglionic level infarction (caudate, lentiform nuclei, internal capsule, insula, M1-M3 cortex): 7 - Supraganglionic infarction (M4-M6 cortex): 3 Total score (0-10  with 10 being normal): 10 IMPRESSION: 1. No acute intracranial abnormality. 2. ASPECTS is 10. 3. Mild chronic small vessel disease and volume loss. These results were called by telephone at the time of interpretation on 09/26/2023 at 8:31 pm to provider MODOU JAWO , who verbally acknowledged these results. Electronically Signed   By: Deatra Robinson M.D.   On: 09/26/2023 20:34   NM Myocar Multi W/Spect Izetta Dakin Motion / EF  Result Date: 09/26/2023   Findings are consistent with no ischemia. The study is intermediate risk.   LV perfusion is normal. There is no evidence of ischemia. There is evidence of old apical infarction.   Left ventricular function is abnormal. Global function is severely reduced. There were no regional wall motion abnormalities. Nuclear stress EF: 22%. The left ventricular ejection fraction is severely decreased (<30%). End diastolic cavity size is severely enlarged. End systolic cavity size is severely enlarged.   ECHOCARDIOGRAM COMPLETE BUBBLE STUDY  Result Date: 09/24/2023    ECHOCARDIOGRAM REPORT   Patient Name:   MEIKHI FLORO Date of Exam: 09/23/2023 Medical Rec #:  [id number]     Height:       72.0 in Accession #:    [id number]    Weight:       224.0 lb Date of Birth:  12-10-1950     BSA:          2.236 m Patient Age:    72 years      BP:           131/78 mmHg Patient Gender: M             HR:           93 bpm. Exam Location:  [facility] Procedure: 2D Echo, Cardiac Doppler, Color Doppler, Saline Contrast Bubble Study            and Intracardiac Opacification Agent Indications:     Stroke 434.91 / I63.9  History:         Patient has no prior history of Echocardiogram examinations.                  Cardiomyopathy and CHF, CAD, Prior CABG, Stroke; Risk                  Factors:Hypertension, Diabetes and Dyslipidemia.  Sonographer:     Lucendia Herrlich RCS Referring Phys:  [id number] AMY N COX Diagnosing Phys: Alwyn Pea MD IMPRESSIONS  1. Left ventricular ejection fraction, by estimation, is  20 to 25%. The left ventricle  has severely decreased function. The left ventricle demonstrates global hypokinesis. Left ventricular diastolic parameters are consistent with Grade II diastolic dysfunction (pseudonormalization).  2. Right ventricular systolic function is moderately reduced. The right ventricular size is mildly enlarged.  3. Left atrial size was mildly dilated.  4. The mitral valve is normal in structure. Mild mitral valve regurgitation.  5. The aortic valve is normal in structure. Aortic valve regurgitation is trivial. Aortic valve sclerosis is present, with no evidence of aortic valve stenosis. Conclusion(s)/Recommendation(s): No evidence of valvular vegetations on this transthoracic echocardiogram. Consider a transesophageal echocardiogram to exclude infective endocarditis if clinically indicated. No intracardiac source of embolism detected on  this transthoracic study. Consider a transesophageal echocardiogram to exclude cardiac source of embolism if clinically indicated. No left ventricular mural or apical thrombus/thrombi. FINDINGS  Left Ventricle: Left ventricular ejection fraction, by estimation, is 20 to 25%. The left ventricle has severely decreased function. The left ventricle demonstrates global hypokinesis. Definity contrast agent was given IV to delineate the left ventricular endocardial borders. The left ventricular internal cavity size was normal in size. There is no left ventricular hypertrophy. Left ventricular diastolic parameters are consistent with Grade II diastolic dysfunction (pseudonormalization). Right Ventricle: The right ventricular size is mildly enlarged. No increase in right ventricular wall thickness. Right ventricular systolic function is moderately reduced. Left Atrium: Left atrial size was mildly dilated. Right Atrium: Right atrial size was normal in size. Pericardium: There is no evidence of pericardial effusion. Mitral Valve: The mitral valve is normal in structure.  Mild mitral valve regurgitation. Tricuspid Valve: The tricuspid valve is normal in structure. Tricuspid valve regurgitation is mild. Aortic Valve: The aortic valve is normal in structure. Aortic valve regurgitation is trivial. Aortic valve sclerosis is present, with no evidence of aortic valve stenosis. Aortic valve peak gradient measures 3.4 mmHg. Pulmonic Valve: The pulmonic valve was normal in structure. Pulmonic valve regurgitation is not visualized. Aorta: The ascending aorta was not well visualized. IAS/Shunts: No atrial level shunt detected by color flow Doppler.  LEFT VENTRICLE PLAX 2D LVIDd:         4.70 cm   Diastology LVIDs:         4.50 cm   LV e' medial:    10.20 cm/s LV PW:         1.00 cm   LV E/e' medial:  9.0 LV IVS:        1.00 cm   LV e' lateral:   10.60 cm/s LVOT diam:     2.40 cm   LV E/e' lateral: 8.7 LV SV:         61 LV SV Index:   27 LVOT Area:     4.52 cm  RIGHT VENTRICLE             IVC RV S prime:     10.60 cm/s  IVC diam: 2.20 cm TAPSE (M-mode): 1.3 cm LEFT ATRIUM             Index        RIGHT ATRIUM           Index LA diam:        4.80 cm 2.15 cm/m   RA Area:     24.30 cm LA Vol (A2C):   79.1 ml 35.38 ml/m  RA Volume:   73.45 ml  32.85 ml/m LA Vol (A4C):   80.3 ml 35.92 ml/m LA Biplane Vol: 84.6 ml 37.84 ml/m  AORTIC VALVE AV Area (Vmax): 3.84 cm  AV Vmax:        92.60 cm/s AV Peak Grad:   3.4 mmHg LVOT Vmax:      78.53 cm/s LVOT Vmean:     52.033 cm/s LVOT VTI:       0.135 m  AORTA Ao Root diam: 3.83 cm Ao Asc diam:  3.50 cm MITRAL VALVE MV Area (PHT): 4.86 cm    SHUNTS MV Decel Time: 156 msec    Systemic VTI:  0.14 m MR Peak grad: 41.9 mmHg    Systemic Diam: 2.40 cm MR Vmax:      323.67 cm/s MV E velocity: 92.00 cm/s MV A velocity: 70.60 cm/s MV E/A ratio:  1.30 Alwyn Pea MD Electronically signed by Alwyn Pea MD Signature Date/Time: 09/24/2023/11:28:15 AM    Final    CT ANGIO HEAD NECK W WO CM  Result Date: 09/22/2023 CLINICAL DATA:  Follow-up examination  for stroke. EXAM: CT ANGIOGRAPHY HEAD AND NECK WITH AND WITHOUT CONTRAST TECHNIQUE: Multidetector CT imaging of the head and neck was performed using the standard protocol during bolus administration of intravenous contrast. Multiplanar CT image reconstructions and MIPs were obtained to evaluate the vascular anatomy. Carotid stenosis measurements (when applicable) are obtained utilizing NASCET criteria, using the distal internal carotid diameter as the denominator. RADIATION DOSE REDUCTION: This exam was performed according to the departmental dose-optimization program which includes automated exposure control, adjustment of the mA and/or kV according to patient size and/or use of iterative reconstruction technique. CONTRAST:  75mL OMNIPAQUE IOHEXOL 350 MG/ML SOLN COMPARISON:  MRI from earlier the same day. FINDINGS: CT HEAD FINDINGS Brain: Cerebral volume within normal limits. Mild chronic microvascular ischemic disease. Previously identified left cerebral infarcts grossly stable, better characterized on prior MRI. No associated hemorrhage or mass effect. No other acute large vessel territory infarct. No mass lesion or midline shift. No hydrocephalus or extra-axial fluid collection. Vascular: No abnormal hyperdense vessel. Skull: Scalp soft tissues demonstrate no acute finding. Calvarium intact. Sinuses/Orbits: Globes and orbital soft tissues within normal limits. Visualized paranasal sinuses and mastoid air cells are clear. Other: None. Review of the MIP images confirms the above findings CTA NECK FINDINGS Aortic arch: Visualized aortic arch within normal limits for caliber with standard 3 vessel morphology. Mild aortic atherosclerosis. No stenosis about the origin the great vessels. Right carotid system: Right common and internal carotid arteries are patent without dissection. Bulky calcified plaque about the right carotid bulb with associated stenosis of up to 60% by NASCET criteria. Left carotid system: Left  common and internal carotid arteries are patent without dissection. Calcified plaque about the left carotid bulb with associated stenosis of up to 60% by NASCET criteria. Vertebral arteries: Both vertebral arteries arise from subclavian arteries. No proximal subclavian artery stenosis. Atheromatous change at the origin of the left vertebral artery with mild stenosis. Left vertebral artery dominant, with a diffusely hypoplastic right vertebral artery. Vertebral arteries otherwise patent without stenosis or dissection. Skeleton: No discrete or worrisome osseous lesions. Mild to moderate spondylosis noted at C5-6 and C6-7. Prior sternotomy noted. Other neck: No other acute finding. Upper chest: Linear subsegmental atelectatic changes noted within the left lung. No other acute finding. Review of the MIP images confirms the above findings CTA HEAD FINDINGS Anterior circulation: Extensive atheromatous plaque throughout the carotid siphons with secondary moderate diffuse narrowing bilaterally. A1 segments patent bilaterally. Normal anterior communicating artery complex. Anterior cerebral arteries patent without significant stenosis. No M1 stenosis or occlusion. No proximal MCA branch occlusion or high-grade  stenosis. Distal MCA branches perfused and symmetric. Posterior circulation: Both V4 segments patent without significant stenosis. Both PICA patent. Basilar patent without stenosis. Superior cerebellar arteries patent bilaterally. Both PCAs primarily supplied via the basilar. Short-segment moderate right P2 stenosis (series 11, image 20). Both PCAs otherwise patent to their distal aspects without significant stenosis. Venous sinuses: Patent allowing for timing the contrast bolus. Anatomic variants: As above.  No aneurysm. Review of the MIP images confirms the above findings IMPRESSION: CT HEAD: 1. Grossly stable left cerebral infarcts, better characterized on prior MRI. No associated hemorrhage or mass effect. 2. No  other acute intracranial abnormality. CTA HEAD AND NECK: 1. Negative CTA for large vessel occlusion or other emergent finding. 2. Bulky calcified plaque about the carotid bulbs with associated stenosis of up to 60% bilaterally. 3. Extensive atheromatous plaque throughout the carotid siphons with secondary moderate diffuse narrowing bilaterally. 4. Short-segment moderate right P2 stenosis. 5.  Aortic Atherosclerosis (ICD10-I70.0). Electronically Signed   By: Rise Mu M.D.   On: 09/22/2023 20:35   MR BRAIN WO CONTRAST  Result Date: 09/22/2023 CLINICAL DATA:  Right hand numbness over the last 2-3 weeks. Neuro deficit, acute, stroke suspected. EXAM: MRI HEAD WITHOUT CONTRAST TECHNIQUE: Multiplanar, multiecho pulse sequences of the brain and surrounding structures were obtained without intravenous contrast. COMPARISON:  CT head without contrast 01/15/2019. FINDINGS: Brain: Scattered foci of restricted diffusion are present over the left frontal and parietal convexity. Largest in scratched at the large cortical infarct involves the postcentral gyrus, likely accounting for the right hand numbness. Whether cortical and subcortical foci are present. T2 and FLAIR hyperintensities are associated with the areas of acute/subacute infarction. Remote lacunar infarcts are present within the centrum semiovale bilaterally. Focal petechial hemorrhage is associated with the infarct of the left postcentral gyrus. Remote lacunar infarcts of the left thalamus are again noted. White matter changes extend into the brainstem. Vascular: Flow is present in the major intracranial arteries. Skull and upper cervical spine: The craniocervical junction is normal. Upper cervical spine is within normal limits. Marrow signal is unremarkable. Sinuses/Orbits: The paranasal sinuses and mastoid air cells are clear. Right lens replacement is present. Globes and orbits are otherwise within normal limits. IMPRESSION: 1. Scattered foci of  acute/subacute infarction over the left frontal and parietal convexity. This may represent left-sided emboli or a watershed infarct. 2. The largest infarct involves the left postcentral gyrus, likely accounting for the right hand numbness. 3. Focal petechial hemorrhage associated with the infarct of the left postcentral gyrus. 4. Remote lacunar infarcts of the centrum semiovale bilaterally and left thalamus. 5. Underlying small vessel ischemic disease. These results were called by telephone at the time of interpretation on 09/22/2023 at 5:18 pm to provider [facility] , who verbally acknowledged these results. Electronically Signed   By: Marin Roberts M.D.   On: 09/22/2023 17:18   IR EXCHANGE BILIARY DRAIN  Result Date: 09/01/2023 INDICATION: Routine exchange, surgery planned early December EXAM: Exchange of percutaneous cholecystostomy tube using fluoroscopic guidance MEDICATIONS: None ANESTHESIA/SEDATION: Local analgesia FLUOROSCOPY: Radiation Exposure Index (as provided by the fluoroscopic device): 1.2 minutes (25 mGy) COMPLICATIONS: None immediate. PROCEDURE: Informed written consent was obtained from the patient after a thorough discussion of the procedural risks, benefits and alternatives. All questions were addressed. Maximal Sterile Barrier Technique was utilized including caps, mask, sterile gowns, sterile gloves, sterile drape, hand hygiene and skin antiseptic. A timeout was performed prior to the initiation of the procedure. Patient was placed supine on the exam table.  The right upper quadrant was prepped and draped in the standard sterile fashion with inclusion of the existing percutaneous cholecystostomy tube within the sterile field. The existing cholecystostomy tube was injected with contrast material, which demonstrated a decompressed gallbladder lumen. There is brisk filling of the cystic duct and common bile duct, which are noted to be normal in caliber without focal filling defects.  There is passage of contrast material through the ampulla into the small bowel. The locking loop was released, and over an Amplatz guidewire, the existing cholecystostomy tube was exchanged for a new, similar ten [place] cholecystostomy tube. Locking loop was formed, and injection of contrast material demonstrated appropriate location of the gallbladder lumen. It was secured to the skin using silk suture and a dressing. It was attached to bag drainage. The patient tolerated the procedure well without immediate complication. IMPRESSION: 1. Successful exchange of percutaneous cholecystostomy tube for a new, similar 10 French cholecystostomy tube. Cholecystostomy tube placed to bag drainage. 2. Cholangiogram demonstrated patent cystic duct and common bile duct without filling defect. Electronically Signed   By: Olive Bass M.D.   On: 09/01/2023 16:55    Microbiology: Recent Results (from the past 240 hour(s))  Urine Culture     Status: Abnormal   Collection Time: 09/27/23  1:00 AM   Specimen: Urine, Random  Result Value Ref Range Status   Specimen Description   Final    URINE, RANDOM Performed at [facility], [address] [place], [place] [address]    Special Requests   Final    NONE Reflexed from [phone] [id number] Performed at [facility], [address] [place], [place] [address]    Culture >=100,000 COLONIES/mL ESCHERICHIA COLI (A)  Final   Report Status 09/29/2023 FINAL  Final   Organism ID, Bacteria ESCHERICHIA COLI (A)  Final      Susceptibility   Escherichia coli - MIC*    AMPICILLIN >=32 RESISTANT Resistant     CEFAZOLIN <=4 SENSITIVE Sensitive     CEFEPIME <=0.12 SENSITIVE Sensitive     CEFTRIAXONE <=0.25 SENSITIVE Sensitive     CIPROFLOXACIN <=0.25 SENSITIVE Sensitive     GENTAMICIN >=16 RESISTANT Resistant     IMIPENEM <=0.25 SENSITIVE Sensitive     NITROFURANTOIN <=16 SENSITIVE Sensitive     TRIMETH/SULFA >=320 RESISTANT Resistant     AMPICILLIN/SULBACTAM 16  INTERMEDIATE Intermediate     PIP/TAZO <=4 SENSITIVE Sensitive ug/mL    * >=100,000 COLONIES/mL ESCHERICHIA COLI     Labs: CBC: Recent Labs  Lab 09/22/23 1558 09/27/23 0424 09/28/23 0528 09/29/23 0519  WBC 11.2* 23.7* 13.0* 8.1  NEUTROABS 8.1*  --   --   --   HGB 13.0 11.8* 12.4* 12.5*  HCT 40.2 35.1* 37.7* 38.0*  MCV 91.0 87.5 90.8 90.5  PLT 323 339 360 334   Basic Metabolic Panel: Recent Labs  Lab 09/25/23 0552 09/26/23 0421 09/27/23 0424 09/28/23 0528 09/29/23 0519  NA 136 135 133* 136 137  K 3.7 3.7 3.7 3.9 3.9  CL 103 103 102 102 104  CO2 24 25 23 24 23   GLUCOSE 118* 120* 163* 124* 153*  BUN 20 21 18 20 19   CREATININE 0.59* 0.57* 0.66 0.69 0.61  CALCIUM 8.6* 8.4* 8.6* 8.8* 8.5*  MG 2.0 2.1 1.8 2.0 2.0  PHOS  --   --  3.2 3.3 3.3   Liver Function Tests: Recent Labs  Lab 09/22/23 1558  AST 25  ALT 33  ALKPHOS 96  BILITOT 0.8  PROT  7.2  ALBUMIN 3.4*   No results for input(s): "LIPASE", "AMYLASE" in the last 168 hours. No results for input(s): "AMMONIA" in the last 168 hours. Cardiac Enzymes: No results for input(s): "CKTOTAL", "CKMB", "CKMBINDEX", "TROPONINI" in the last 168 hours. BNP (last 3 results) No results for input(s): "BNP" in the last 8760 hours. CBG: Recent Labs  Lab 09/28/23 0754 09/28/23 0947 09/28/23 1631 09/28/23 2131 09/29/23 0747  GLUCAP 132* 143* 211* 175* 136*    Time spent: 35 minutes  Signed:  Gillis Santa  [facility] 09/29/2023 12:54 PM

## 2023-09-29 NOTE — Progress Notes (Signed)
  Progress Note    09/29/2023 8:18 AM 1 Day Post-Op  Subjective:  [name] is a 72 male with history of hyperlipidemia, possible history of TIA, insulin-dependent diabetes melitis type II, hypertension, history of acute cholecystitis, who presents emergency department for chief concerns of right hand numbness for 2 to 3 weeks. It has gradually worsened, prompting him to present to the emergency department for further evaluation as he is supposed to have cholecystectomy next week. He wanted to figure this out before the surgery.   Patient is now postop day 1 from a thoracic aortogram with cervical and cerebral bilateral carotid angiograms.  Study was completely diagnostic at this point in time.  Patient did not require any angioplasty or stent placement.  We had a long discussion this morning concerning symptoms he was having which are not related to his vascular system.  No complaints overnight vitals all remained stable patient wishes to go home.   Vitals:   09/29/23 0601 09/29/23 0746  BP: 118/76 113/76  Pulse: 76 79  Resp: 20 17  Temp: 97.9 F (36.6 C) 97.6 F (36.4 C)  SpO2: 98% 99%   Physical Exam: Cardiac:  RRR, Normal S1, S2. No murmurs Lungs:  Clear on auscultation. Non labored breathing on room air.  Incisions: Right groin incision with dressing clean dry and intact.  No hematoma or seroma to note Extremities:  Warm to touch with palpable pulses throughout. Numbness to his fingers on the right. Full ROM.  Abdomen:  Positive bowel sounds throughout, soft, non tender and non distended.  Neurologic: AAOX4 and follows commands appropriately CBC    Component Value Date/Time   WBC 8.1 09/29/2023 0519   RBC 4.20 (L) 09/29/2023 0519   HGB 12.5 (L) 09/29/2023 0519   HCT 38.0 (L) 09/29/2023 0519   PLT 334 09/29/2023 0519   MCV 90.5 09/29/2023 0519   MCH 29.8 09/29/2023 0519   MCHC 32.9 09/29/2023 0519   RDW 15.5 09/29/2023 0519   LYMPHSABS 2.0 09/22/2023 1558   MONOABS  0.8 09/22/2023 1558   EOSABS 0.2 09/22/2023 1558   BASOSABS 0.0 09/22/2023 1558    BMET    Component Value Date/Time   NA 137 09/29/2023 0519   K 3.9 09/29/2023 0519   CL 104 09/29/2023 0519   CO2 23 09/29/2023 0519   GLUCOSE 153 (H) 09/29/2023 0519   BUN 19 09/29/2023 0519   CREATININE 0.61 09/29/2023 0519   CALCIUM 8.5 (L) 09/29/2023 0519   GFRNONAA >60 09/29/2023 0519   GFRAA >60 03/17/2019 1306    INR    Component Value Date/Time   INR 1.2 07/28/2023 1554     Intake/Output Summary (Last 24 hours) at 09/29/2023 0818 Last data filed at 09/28/2023 1619 Gross per 24 hour  Intake 279.33 ml  Output 175 ml  Net 104.33 ml     Assessment/Plan:  72 y.o. male is s/p aortogram  with cervical and cerebral bilateral carotid angiograms.  He was completely diagnostic.  PLAN: Per vascular surgery patient's thoracic aortogram with cervical and cerebral bilateral carotid angiograms were negative.  No angioplasty or stent placement to carotids was needed.  Therefore patient can be discharged to home today.  We will request follow-up in 6 months to observe carotid arteries for any changes.  DVT prophylaxis: ASA 81 mg daily and Plavix 75 mg daily.   Marcie [facility] 09/29/2023 8:18 AM

## 2023-10-23 ENCOUNTER — Encounter: Payer: Self-pay | Admitting: Surgery

## 2023-10-23 ENCOUNTER — Ambulatory Visit (INDEPENDENT_AMBULATORY_CARE_PROVIDER_SITE_OTHER): Payer: Medicare Other | Admitting: Surgery

## 2023-10-23 VITALS — BP 123/77 | HR 71 | Temp 98.2°F | Ht 72.0 in | Wt 223.2 lb

## 2023-10-23 DIAGNOSIS — K810 Acute cholecystitis: Secondary | ICD-10-CM

## 2023-10-23 NOTE — Patient Instructions (Signed)
Cholelithiasis  Cholelithiasis happens when gallstones form in the gallbladder. The gallbladder stores bile. Bile is a fluid that helps digest fats. Bile can harden and form into gallstones. If they cause a blockage, they can cause pain (gallbladder attack). What are the causes? This condition may be caused by: Too much bilirubin in the bile. This happens if you have sickle cell anemia. Too much of a fat-like substance (cholesterol) in your bile. Not enough bile salts in your bile. These salts help the body absorb and digest fats. The gallbladder not emptying fully or often enough. This is common in pregnant women. What increases the risk? The following factors may make you more likely to develop this condition: Being older than age 3. Eating a lot of fried foods, fat, and refined carbs (refined carbohydrates). Being male. Being pregnant many times. Using medicines with male hormones in them for a long time. Losing weight fast. Having gallstones in your family. Having health problems, such as diabetes, obesity, Crohn's disease, or liver disease. What are the signs or symptoms? Often, there may be gallstones but no symptoms. These gallstones are called silent gallstones. If a gallstone causes a blockage, you may get sudden pain. The pain: Can be in the upper right part of your belly (abdomen). Normally comes at night or after you eat. Can last an hour or more. Can spread to your right shoulder, back, or chest. Can feel like discomfort, burning, or fullness in the upper part of your belly (indigestion). If the blockage lasts more than a few hours, you can get an infection or swelling. You may: Vomit or feel like you may vomit (nauseous). Feel bloated. Have belly pain for 5 hours or more. Feel tender in your belly, often in the upper right part and under your ribs. Have a fever or chills. Have skin or the white parts of your eyes turn yellow (jaundice). Have dark pee (urine) or  pale poop (stool). How is this treated? Treatment for this condition depends on how bad you feel. If you have symptoms, you may need: Home care, if symptoms are not very bad. Do not eat for 12-24 hours. Drink only water and clear liquids. After 1 or 2 days, start to eat simple or clear foods. Try broth and crackers. You may need medicines for pain or stomach upset or both. If you have an infection, you will need antibiotics. A hospital stay, if you have very bad pain or a very bad infection. Surgery to remove your gallbladder. You may need this if: Gallstones keep coming back. You have very bad symptoms. Medicines to break up gallstones. Medicines may be used for 6-12 months. A procedure to find and take out gallstones or to break up gallstones. Follow these instructions at home: Medicines Take over-the-counter and prescription medicines only as told by your doctor. If you were prescribed antibiotics, take them as told by your doctor. Do not stop taking them even if you start to feel better. Ask your doctor if you should avoid driving or using machines while you are taking your medicine. Eating and drinking Drink enough fluid to keep your pee pale yellow. Drink water or clear fluids. This is important when you have pain. Eat healthy foods. Choose: Fewer fatty foods, such as fried foods. Fewer refined carbs. Avoid breads and grains that are highly processed, such as white bread and white rice. Choose whole grains, such as whole-wheat bread and brown rice. More fiber. Almonds, fresh fruit, and beans are healthy sources. General  instructions Keep a healthy weight. Keep all follow-up visits. You may need to see a specialist or a Careers adviser. Where to find more information [employer] of Diabetes and Digestive and Kidney Diseases: [email or web address] Contact a doctor if: You have sudden pain in the upper right part of your belly. Pain might spread to your right shoulder, back, or chest. Your  pain lasts more than 2 hours. You have been diagnosed with gallstones that have no symptoms and you get: Belly pain. Discomfort, burning, or fullness in the upper part of your abdomen. You keep feeling like you may vomit. You have dark pee or pale poop. Get help right away if: You have pain in your abdomen, that: Lasts more than 5 hours. Keeps getting worse. You have a fever or chills. You can't stop vomiting. Your skin or the white parts of your eyes turn yellow. This information is not intended to replace advice given to you by your health care provider. Make sure you discuss any questions you have with your health care provider. Document Revised: 07/25/2022 Document Reviewed: 07/25/2022 Elsevier Patient Education  2024 [employer].

## 2023-10-23 NOTE — Progress Notes (Signed)
10/23/2023  History of Present Illness: Alexander [name] is a 72 y.o. male with history of acute cholecystitis, s/p percutaneous cholecystostomy drain placement on 07/28/23.  He was scheduled for robotic cholecystectomy on 09/28/23 but he was admitted on 11/29 with right sided weakness/numbness and MRI showing infarcts in left frontal and parietal lobes.  He is now on dual Aspirin and Plavix.  As part of his workup, he also had a stress test which showed a significantly decreased EF of 22%.  From the gallbladder standpoint, he denies any RUQ pain, but reports some irritation at the drain insertion site.  He is flushing the drain, but his wife reports that the flush leaks around the drain after a while.  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)    Pneumonia 07/05/2023   admitted to [facility]   Polyneuropathy    S/P CABG x 5 2017   Sepsis (HCC) 07/05/2023   Severe nonproliferative diabetic retinopathy of left eye (HCC)    Stroke (HCC) 2014   mini, tingling right hand     Past Surgical History: Past Surgical History:  Procedure Laterality Date   CARDIAC CATHETERIZATION Left 08/09/2016   Procedure: Left Heart Cath and Coronary Angiography;  Surgeon: Dalia Heading, MD;  Location: [facility] INVASIVE CV [facility];  Service: Cardiovascular;  Laterality: Left;   CAROTID PTA/STENT INTERVENTION Left 09/28/2023   Procedure: CAROTID PTA/STENT INTERVENTION;  Surgeon: Renford Dills, MD;  Location: [facility] INVASIVE CV LAB;  Service:  Cardiovascular;  Laterality: Left;   CATARACT EXTRACTION W/PHACO Right 02/06/2023   Procedure: CATARACT EXTRACTION PHACO AND INTRAOCULAR LENS PLACEMENT (IOC) AHMED TUBE SHUNT W/ TUTOPLAST RIGHT DIABETIC  3.94   00:42.6;  Surgeon: Nevada Crane, MD;  Location[facility];  Service: Ophthalmology;  Laterality: Right;   COLONOSCOPY WITH PROPOFOL N/A 02/02/2021   Procedure: COLONOSCOPY WITH PROPOFOL;  Surgeon: Regis Bill, MD;  Location: [facility] ENDOSCOPY;  Service: Endoscopy;  Laterality: N/A;   CORONARY ARTERY BYPASS GRAFT  2017   x5   IR EXCHANGE BILIARY DRAIN  09/01/2023   IR PERC CHOLECYSTOSTOMY  07/28/2023    Home Medications: Prior to Admission medications   Medication Sig Start Date End Date Taking? Authorizing Provider  acetaminophen (TYLENOL) 500 MG tablet Take 1,000 mg by mouth every 6 (six) hours as needed for mild pain or headache.    [provider]  aspirin EC 81 MG tablet Take 1 tablet (81 mg total) by mouth daily. 09/29/23 09/28/24  Gillis Santa, MD  atorvastatin (LIPITOR) 80 MG tablet Take 1 tablet (80 mg total) by mouth daily. Hold until you see your doctor and have your liver enzymes repeated 07/30/23   Arnetha Courser, MD  carvedilol (COREG) 3.125 MG tablet Take 3.125 mg by mouth 2 (two) times daily with a meal.    [provider]  clopidogrel (PLAVIX) 75 MG tablet Take 1 tablet (75 mg total)  by mouth daily. 09/30/23 03/28/24  Gillis Santa, MD  feeding supplement (ENSURE ENLIVE / ENSURE PLUS) LIQD Take 237 mLs by mouth 2 (two) times daily between meals. 07/30/23   Arnetha Courser, MD  LANTUS SOLOSTAR 100 UNIT/ML Solostar Pen Inject 38 Units into the skin at bedtime. 07/14/23   [provider]  OZEMPIC, 2 MG/DOSE, 8 MG/3ML SOPN Inject 2 mg into the skin every 7 (seven) days. Sundays    [provider]  sacubitril-valsartan (ENTRESTO) 24-26 MG Take 1 tablet by mouth every 12 (twelve) hours. 09/29/23 09/28/24  Gillis Santa, MD     Allergies: No Known Allergies  Review of Systems: Review of Systems  Constitutional:  Negative for chills and fever.  Respiratory:  Negative for shortness of breath.   Cardiovascular:  Negative for chest pain.  Gastrointestinal:  Negative for abdominal pain, nausea and vomiting.    Physical Exam BP 123/77   Pulse 71   Temp 98.2 F (36.8 C)   Ht 6' (1.829 m)   Wt 223 lb 3.2 oz (101.2 kg)   SpO2 99%   BMI 30.27 kg/m  CONSTITUTIONAL: No acute distress, well nourished.  HEENT:  Normocephalic, atraumatic, extraocular motion intact. RESPIRATORY: Normal respiratory effort without pathologic use of accessory muscles. CARDIOVASCULAR: Regular rhythm and rate. GI: The abdomen is soft, non-distended, non-tender.  Drain in place with suture intact.  Drain flushes easily but some of the flush fluid does leak around the drain.  New dressing applied. NEUROLOGIC:  Motor and sensation is grossly normal.  Cranial nerves are grossly intact. PSYCH:  Alert and oriented to person, place and time. Affect is normal.  Labs/Imaging: MRI brain on 09/22/23: IMPRESSION: 1. Scattered foci of acute/subacute infarction over the left frontal and parietal convexity. This may represent left-sided emboli or a watershed infarct. 2. The largest infarct involves the left postcentral gyrus, likely accounting for the right hand numbness. 3. Focal petechial hemorrhage associated with the infarct of the left postcentral gyrus. 4. Remote lacunar infarcts of the centrum semiovale bilaterally and left thalamus. 5. Underlying small vessel ischemic disease.     Assessment and Plan: This is a 72 y.o. male with acute cholecystitis, s/p percutaneous cholecystostomy drain placement.  --Discussed with the patient that with all the new medical issues, unfortunately now doing a cholecystectomy is much higher risk.  First, he is now on dual antiplatelet therapy and with the recent stroke, any surgical intervention carries  risk of worsening stroke and he also would not be able to come off DAPT soon.  In addition to this, his EF is now significantly worse which would put him at higher cardiac risk. --I had discussed with Dr. Milford Cage with Interventional Radiology about possible Spyglass procedure for this patient.  I have sent referral for evaluation.  Currently there's no appointment set up yet, so will send new referral as a precaution.  Hopefully would be able to extract his gallstones to prevent further episodes and thus avoid any general anesthesia or cholecystectomy. --Patient may follow up as needed.  Return precautions given.  I spent 30 minutes dedicated to the care of this patient on the date of this encounter to include pre-visit review of records, face-to-face time with the patient discussing diagnosis and management, and any post-visit coordination of care.   Howie Ill, MD [facility]

## 2023-10-27 ENCOUNTER — Other Ambulatory Visit: Payer: Self-pay | Admitting: Interventional Radiology

## 2023-10-27 DIAGNOSIS — K810 Acute cholecystitis: Secondary | ICD-10-CM

## 2023-10-31 ENCOUNTER — Other Ambulatory Visit: Payer: Self-pay | Admitting: Interventional Radiology

## 2023-10-31 DIAGNOSIS — K810 Acute cholecystitis: Secondary | ICD-10-CM

## 2023-11-08 ENCOUNTER — Ambulatory Visit: Payer: Medicare Other | Admitting: Surgery

## 2023-11-15 ENCOUNTER — Ambulatory Visit: Payer: Medicare Other | Admitting: Radiology

## 2023-11-15 ENCOUNTER — Telehealth: Payer: Medicare Other

## 2023-11-16 NOTE — Progress Notes (Signed)
Patient for IR Chole tube exchange on Friday 11/17/2023, I called and spoke with the patient on the phone and gave pre-procedure instructions. Pt was made aware to be here at 11a and check in at the new entrance. Pt stated understanding.  Called 11/06/2023

## 2023-11-17 ENCOUNTER — Ambulatory Visit
Admission: RE | Admit: 2023-11-17 | Discharge: 2023-11-17 | Disposition: A | Discharge status: Home / Self Care | Payer: Medicare Other | Source: Ambulatory Visit | Attending: Interventional Radiology | Admitting: Interventional Radiology

## 2023-11-17 DIAGNOSIS — K810 Acute cholecystitis: Secondary | ICD-10-CM | POA: Insufficient documentation

## 2023-11-17 HISTORY — PX: IR EXCHANGE BILIARY DRAIN: IMG6046

## 2023-11-17 HISTORY — PX: IR RADIOLOGIST EVAL & MGMT: IMG5224

## 2023-11-17 MED ORDER — SILVER NITRATE-POT NITRATE 75-25 % EX MISC
CUTANEOUS | Status: AC
Start: 2023-11-17 — End: ?
  Filled 2023-11-17: qty 10

## 2023-11-17 MED ORDER — IOHEXOL 300 MG/ML  SOLN
50.0000 mL | Freq: Once | INTRAMUSCULAR | Status: AC | PRN
  Administered 2023-11-17: 20 mL

## 2023-11-17 MED ORDER — LIDOCAINE HCL 1 % IJ SOLN
INTRAMUSCULAR | Status: AC
  Filled 2023-11-17: qty 20

## 2023-11-17 MED ORDER — SILVER NITRATE-POT NITRATE 75-25 % EX MISC
1.0000 | Freq: Once | CUTANEOUS | Status: AC
  Administered 2023-11-17: 1 via TOPICAL

## 2023-11-17 MED ORDER — LIDOCAINE HCL 1 % IJ SOLN
20.0000 mL | Freq: Once | INTRAMUSCULAR | Status: AC
  Administered 2023-11-17: 10 mL

## 2023-11-17 NOTE — Progress Notes (Signed)
Reason for visit: evaluation for  percutaneous cholangioscopy (SpyGlass).   Care Team(s): Primary Care; Gauger, Hermenia Fiscal, NP General Surgeon; Henrene Dodge, MD  History of Present Illness:  [name] is a 73 y.o. M comorbid w PMHx significant for CAD, CHF and T2DM who is known to VIR s/p cholecystostomy drain placement w me on 07/28/23. Pt had been scheduled for elective cholecystectomy w general surgery, Dr Aleen Campi, but was admitted days leading up to his surgery for stroke workup. He was found to have L-sided stroke and underwent cerebral angiography for carotid stent eval then placed on anticoagulation with DAPT. Pre surgical workup including Echo and NM cardiac scan revealed LVEF <25%. Given his increased comorbidity, his surgery was cancelled.  His surgeon spoke with me about an option for percutaneous clearance of potential biliary stones and a referral was made for evaluation for  percutaneous cholangioscopy (SpyGlass). The Pt is seen in consultation prior to his biliary drain evaluation, and joined by his wife, Alexander [name]. He reports minimal biliary drainage and stone fragments within the tube and bag. He denies abdominal pain, F/C, N/V.  Review of Systems: A 12-point ROS discussed, and pertinent positives are indicated in the HPI above.  All other systems are negative.   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)    Pneumonia 07/05/2023   admitted to [facility]   Polyneuropathy    S/P CABG x 5 2017   Sepsis (HCC)  07/05/2023   Severe nonproliferative diabetic retinopathy of left eye (HCC)    Stroke (HCC) 2014   mini, tingling right hand    Past Surgical History:  Procedure Laterality Date   CARDIAC CATHETERIZATION Left 08/09/2016   Procedure: Left Heart Cath and Coronary Angiography;  Surgeon: Dalia Heading, MD;  Location: [facility] INVASIVE CV LAB;  Service: Cardiovascular;  Laterality: Left;   CAROTID PTA/STENT INTERVENTION Left 09/28/2023   Procedure: CAROTID PTA/STENT INTERVENTION;  Surgeon: Renford Dills, MD;  Location: [facility] INVASIVE CV LAB;  Service: Cardiovascular;  Laterality: Left;   CATARACT EXTRACTION W/PHACO Right 02/06/2023   Procedure: CATARACT EXTRACTION PHACO AND INTRAOCULAR LENS PLACEMENT (IOC) AHMED TUBE SHUNT W/ TUTOPLAST RIGHT DIABETIC  3.94   00:42.6;  Surgeon: Nevada Crane, MD;  Location: [facility];  Service: Ophthalmology;  Laterality: Right;   COLONOSCOPY WITH PROPOFOL N/A 02/02/2021   Procedure: COLONOSCOPY WITH PROPOFOL;  Surgeon: Regis Bill, MD;  Location: [facility] ENDOSCOPY;  Service: Endoscopy;  Laterality: N/A;   CORONARY ARTERY BYPASS GRAFT  2017   x5   IR EXCHANGE BILIARY DRAIN  09/01/2023   IR EXCHANGE BILIARY DRAIN  11/17/2023   IR PERC CHOLECYSTOSTOMY  07/28/2023   IR RADIOLOGIST EVAL & MGMT  11/17/2023    Allergies: Patient has no known allergies.  Medications: Prior to Admission medications   Medication Sig Start Date End Date Taking? Authorizing Provider  acetaminophen (TYLENOL) 500 MG tablet Take 1,000 mg  by mouth every 6 (six) hours as needed for mild pain or headache.    [provider]  aspirin EC 81 MG tablet Take 1 tablet (81 mg total) by mouth daily. 09/29/23 09/28/24  Gillis Santa, MD  atorvastatin (LIPITOR) 80 MG tablet Take 1 tablet (80 mg total) by mouth daily. Hold until you see your doctor and have your liver enzymes repeated 07/30/23   Arnetha Courser, MD  carvedilol (COREG) 3.125 MG tablet Take 3.125 mg by mouth 2  (two) times daily with a meal.    [provider]  clopidogrel (PLAVIX) 75 MG tablet Take 1 tablet (75 mg total) by mouth daily. 09/30/23 03/28/24  Gillis Santa, MD  feeding supplement (ENSURE ENLIVE / ENSURE PLUS) LIQD Take 237 mLs by mouth 2 (two) times daily between meals. 07/30/23   Arnetha Courser, MD  LANTUS SOLOSTAR 100 UNIT/ML Solostar Pen Inject 38 Units into the skin at bedtime. 07/14/23   [provider]  OZEMPIC, 2 MG/DOSE, 8 MG/3ML SOPN Inject 2 mg into the skin every 7 (seven) days. Sundays    [provider]  sacubitril-valsartan (ENTRESTO) 24-26 MG Take 1 tablet by mouth every 12 (twelve) hours. 09/29/23 09/28/24  Gillis Santa, MD     Family History  Problem Relation Age of Onset   Hypertension Mother    Diabetes Mother     Social History   Socioeconomic History   Marital status: Married    Spouse name: Not on file   Number of children: Not on file   Years of education: Not on file   Highest education level: Not on file  Occupational History   Not on file  Tobacco Use   Smoking status: Never    Passive exposure: Never   Smokeless tobacco: Never  Vaping Use   Vaping status: Never Used  Substance and Sexual Activity   Alcohol use: No   Drug use: Never   Sexual activity: Yes    Partners: Female  Other Topics Concern   Not on file  Social History Narrative   Not on file   Social Drivers of Health   Financial Resource Strain: Not on file  Food Insecurity: No Food Insecurity (09/24/2023)   Hunger Vital Sign    Worried About Running Out of Food in the Last Year: Never true    Ran Out of Food in the Last Year: Never true  Transportation Needs: No Transportation Needs (09/29/2023)   PRAPARE - Administrator, Civil Service (Medical): No    Lack of Transportation (Non-Medical): No  Physical Activity: Not on file  Stress: Not on file  Social Connections: Not on file    Review of Systems As above  Vital Signs: BP 123/65    Pulse 89   SpO2 100%   Physical Exam  General: Elderly male. NAD  CV: RRR on monitor Pulm: normal work of breathing on RA Abd: S, ND, NT. Chole drain at RUQ MSK: Grossly normal Psych: Appropriate affect.  Imaging:  Independently reviewed, demonstrating a contracted GB. Patent cystic and common bile ducts.    IR EXCHANGE BILIARY DRAIN Result Date: 11/17/2023 INDICATION: History of acute cholecystitis, s/p cholecystostomy tube placement on 07/28/2023. Last exchange 09/01/2023. EXAM: CHOLECYSTOSTOMY TUBE EVALUATION and EXCHANGE COMPARISON:  IR fluoroscopy, 09/01/2023 and 07/28/2023. MRCP, 07/28/2023. MEDICATIONS: None ANESTHESIA/SEDATION: Local anesthetic was administered. CONTRAST:  20mL OMNIPAQUE IOHEXOL 300 MG/ML SOLN - administered into the gallbladder lumen. FLUOROSCOPY TIME:  Fluoroscopic dose; 8.6 mGy COMPLICATIONS: None immediate. PROCEDURE: The  patient was positioned supine on the fluoroscopy table. The external portion of the existing cholecystostomy tube as well as the surrounding skin was prepped and draped in usual sterile fashion. A time-out was performed prior to the initiation of the procedure. A preprocedural spot fluoroscopic image was obtained of the right upper abdominal quadrant existing cholecystostomy tube. The skin surrounding the cholecystostomy tube was anesthetized with 1% lidocaine with epinephrine. The external portion of the cholecystostomy tube was cut and cannulated with a short Amplatz wire which was advanced through the tube and coiled within the gallbladder lumen. The tube was removed and a 5 Fr Kumpe catheter was advanced over wire, then attempts at cannulating the cystic duct was performed using a 0.035 inch glidewire. Trans-cystic access could not be obtained. Next, over the wire and under intermittent fluoroscopic guidance, a new 10 Fr cholecystostomy tube which was repositioned into the more central aspect of the gallbladder lumen. Contrast injection confirms  appropriate positioning and functionality of the cholecystomy tube. The cholecystostomy tube was flushed with a small amount of saline and reconnected to a gravity bag. The cholecystostomy tube was secured with an interrupted suture and a Stat Lock device. A dressing was applied. The patient tolerated the procedure well without immediate postprocedural complication. FINDINGS: Preprocedural spot fluoroscopic image demonstrates unchanged positioning of cholecystostomy tube with end coiled and locked over the expected location of the fundus of the gallbladder After fluoroscopic guided exchange, the new 10 Fr cholecystostomy tube is more ideally positioned with end coiled and locked within the central aspect of the gallbladder lumen. Post exchange cholangiogram demonstrates appropriate positioning and functionality of the new cholecystostomy tube. Contracted gallbladder around the cholecystostomy tube. Patent cystic duct and a non-dilated common bile duct were noted. Brief attempt at trans cystic access was not successful. IMPRESSION: 1. Successful fluoroscopic-guided exchange and repositioning for a new 10 Fr cholecystostomy tube. 2. Contracted gallbladder. Patent cystic and non-dilated common bile duct. PLAN: Patient was seen preprocedure for evaluation of potential cholangioscopy and biliary stone removal. A capping trial was initiated. The patient was provided with a drainage bag should he need it. The patient will return to Vascular Interventional Radiology (VIR) for routine drainage catheter evaluation and possible removal in 4 weeks. Roanna Banning, MD [facility] Radiology Electronically Signed   By: Roanna Banning M.D.   On: 11/17/2023 13:01   IR Radiologist Eval & Mgmt Result Date: 11/17/2023 EXAM: NEW PATIENT OFFICE VISIT CHIEF COMPLAINT: See below HISTORY OF PRESENT ILLNESS: See below REVIEW OF SYSTEMS: See below PHYSICAL EXAMINATION: See below ASSESSMENT AND  PLAN: Please refer to completed note in the electronic medical record on [facility] Roanna Banning, MD [facility] Radiology Electronically Signed   By: Roanna Banning M.D.   On: 11/17/2023 11:36    Labs:  CBC: Recent Labs    09/22/23 1558 09/27/23 0424 09/28/23 0528 09/29/23 0519  WBC 11.2* 23.7* 13.0* 8.1  HGB 13.0 11.8* 12.4* 12.5*  HCT 40.2 35.1* 37.7* 38.0*  PLT 323 339 360 334    COAGS: Recent Labs    07/05/23 0927 07/28/23 1554  INR 1.3* 1.2  APTT 32  --     BMP: Recent Labs    09/26/23 0421 09/27/23 0424 09/28/23 0528 09/29/23 0519  NA 135 133* 136 137  K 3.7 3.7 3.9 3.9  CL 103 102 102 104  CO2 25 23 24 23   GLUCOSE 120* 163* 124* 153*  BUN 21 18  20 19  CALCIUM 8.4* 8.6* 8.8* 8.5*  CREATININE 0.57* 0.66 0.69 0.61  GFRNONAA >60 >60 >60 >60    LIVER FUNCTION TESTS: Recent Labs    07/29/23 0517 07/30/23 0442 08/07/23 1152 09/22/23 1558  BILITOT 1.7* 1.0 0.9 0.8  AST 363* 271* 63* 25  ALT 378* 307* 126* 33  ALKPHOS 151* 174* 133* 96  PROT 6.4* 6.4* 7.8 7.2  ALBUMIN 2.6* 2.6* 3.5 3.4*    Assessment and Plan:  KYLO GAVIN is a 73 y.o. M comorbid w PMHx significant for CAD, CHF and T2DM s/p cholecystostomy drain placement on 07/28/23. Interval cholecystectomy cancelled given increased comorbidity w L CVA requiring AC w DAPT and worsened cardiac function with EF <25%.  Pt referred for  evaluation for  percutaneous cholangioscopy (SpyGlass).  Pt is interested in pursuing a minimally-invasive option for the treatment of his gallstones at this time. We discussed percutaneous cholangioscopy (SpyGlass), lithotripsy and biliary drain exchange.  *will begin with biliary drain evaluation and exchange today (11/17/23) *based on stone burden and anatomy then will elect to proceed with either percutaneous cholangioscopy, or if cystic and common bile duct are patent then will attempt an extended capping  trial *Pt to return for biliary drain evaluation with me in 4 wks.   Thank you for this interesting consult.  I greatly enjoyed meeting RAZA BAYLESS and look forward to participating in their care.  A copy of this report was sent to the requesting provider on this date.  Electronically Signed:  Roanna Banning, MD [facility] Radiology   Pager. [phone] Clinic. [phone]  I spent a total of 30 Minutes  in face to face in clinical consultation, greater than 50% of which was counseling/coordinating care for [name]'s cholecystostomy drain management and evaluation for  percutaneous cholangioscopy.

## 2023-11-29 ENCOUNTER — Other Ambulatory Visit: Payer: Self-pay | Admitting: Interventional Radiology

## 2023-11-29 DIAGNOSIS — K810 Acute cholecystitis: Secondary | ICD-10-CM

## 2023-12-08 NOTE — Progress Notes (Signed)
Patient for IR Chole tube exchange/Capping Trial on Monday 12/11/23, I called and spoke with the patient on the phone and gave pre-procedure instructions. Pt was made aware to be here at 1:30p and check in at the new entrance. Pt stated understanding.  Called 11/29/23

## 2023-12-11 ENCOUNTER — Ambulatory Visit
Admission: RE | Admit: 2023-12-11 | Discharge: 2023-12-11 | Disposition: A | Discharge status: Home / Self Care | Payer: Medicare Other | Source: Ambulatory Visit | Attending: Interventional Radiology | Admitting: Interventional Radiology

## 2023-12-11 ENCOUNTER — Other Ambulatory Visit: Payer: Self-pay | Admitting: Interventional Radiology

## 2023-12-11 DIAGNOSIS — K810 Acute cholecystitis: Secondary | ICD-10-CM

## 2023-12-11 HISTORY — PX: IR CHOLANGIOGRAM EXISTING TUBE: IMG6040

## 2023-12-11 MED ORDER — IOHEXOL 300 MG/ML  SOLN
8.0000 mL | Freq: Once | INTRAMUSCULAR | Status: AC | PRN
  Administered 2023-12-11: 8 mL

## 2023-12-25 ENCOUNTER — Ambulatory Visit: Payer: Medicare Other | Admitting: Neurology

## 2024-01-17 ENCOUNTER — Encounter: Payer: Self-pay | Admitting: Intensive Care

## 2024-01-17 ENCOUNTER — Inpatient Hospital Stay
Admission: EM | Admit: 2024-01-17 | Discharge: 2024-01-22 | DRG: 271 | Disposition: A | Discharge status: Home / Self Care | Attending: Internal Medicine | Admitting: Internal Medicine

## 2024-01-17 ENCOUNTER — Emergency Department

## 2024-01-17 ENCOUNTER — Other Ambulatory Visit: Payer: Self-pay

## 2024-01-17 DIAGNOSIS — Z79899 Other long term (current) drug therapy: Secondary | ICD-10-CM

## 2024-01-17 DIAGNOSIS — Z7902 Long term (current) use of antithrombotics/antiplatelets: Secondary | ICD-10-CM

## 2024-01-17 DIAGNOSIS — E66811 Obesity, class 1: Secondary | ICD-10-CM | POA: Diagnosis present

## 2024-01-17 DIAGNOSIS — I110 Hypertensive heart disease with heart failure: Secondary | ICD-10-CM | POA: Diagnosis present

## 2024-01-17 DIAGNOSIS — I2510 Atherosclerotic heart disease of native coronary artery without angina pectoris: Secondary | ICD-10-CM | POA: Diagnosis present

## 2024-01-17 DIAGNOSIS — I70261 Atherosclerosis of native arteries of extremities with gangrene, right leg: Secondary | ICD-10-CM | POA: Diagnosis present

## 2024-01-17 DIAGNOSIS — E1152 Type 2 diabetes mellitus with diabetic peripheral angiopathy with gangrene: Secondary | ICD-10-CM | POA: Diagnosis present

## 2024-01-17 DIAGNOSIS — L97518 Non-pressure chronic ulcer of other part of right foot with other specified severity: Secondary | ICD-10-CM | POA: Diagnosis not present

## 2024-01-17 DIAGNOSIS — G8929 Other chronic pain: Secondary | ICD-10-CM | POA: Diagnosis present

## 2024-01-17 DIAGNOSIS — I70235 Atherosclerosis of native arteries of right leg with ulceration of other part of foot: Secondary | ICD-10-CM | POA: Diagnosis not present

## 2024-01-17 DIAGNOSIS — R7989 Other specified abnormal findings of blood chemistry: Secondary | ICD-10-CM | POA: Diagnosis present

## 2024-01-17 DIAGNOSIS — E113391 Type 2 diabetes mellitus with moderate nonproliferative diabetic retinopathy without macular edema, right eye: Secondary | ICD-10-CM | POA: Diagnosis present

## 2024-01-17 DIAGNOSIS — E785 Hyperlipidemia, unspecified: Secondary | ICD-10-CM | POA: Diagnosis present

## 2024-01-17 DIAGNOSIS — E1142 Type 2 diabetes mellitus with diabetic polyneuropathy: Secondary | ICD-10-CM | POA: Diagnosis present

## 2024-01-17 DIAGNOSIS — E872 Acidosis, unspecified: Secondary | ICD-10-CM | POA: Diagnosis present

## 2024-01-17 DIAGNOSIS — E113292 Type 2 diabetes mellitus with mild nonproliferative diabetic retinopathy without macular edema, left eye: Secondary | ICD-10-CM | POA: Diagnosis present

## 2024-01-17 DIAGNOSIS — Z6830 Body mass index (BMI) 30.0-30.9, adult: Secondary | ICD-10-CM | POA: Diagnosis not present

## 2024-01-17 DIAGNOSIS — Z951 Presence of aortocoronary bypass graft: Secondary | ICD-10-CM | POA: Diagnosis not present

## 2024-01-17 DIAGNOSIS — E11621 Type 2 diabetes mellitus with foot ulcer: Secondary | ICD-10-CM | POA: Diagnosis present

## 2024-01-17 DIAGNOSIS — I255 Ischemic cardiomyopathy: Secondary | ICD-10-CM | POA: Diagnosis present

## 2024-01-17 DIAGNOSIS — I70221 Atherosclerosis of native arteries of extremities with rest pain, right leg: Secondary | ICD-10-CM | POA: Diagnosis present

## 2024-01-17 DIAGNOSIS — L97519 Non-pressure chronic ulcer of other part of right foot with unspecified severity: Secondary | ICD-10-CM | POA: Diagnosis present

## 2024-01-17 DIAGNOSIS — M545 Low back pain, unspecified: Secondary | ICD-10-CM | POA: Diagnosis present

## 2024-01-17 DIAGNOSIS — I5022 Chronic systolic (congestive) heart failure: Secondary | ICD-10-CM | POA: Diagnosis present

## 2024-01-17 DIAGNOSIS — Z794 Long term (current) use of insulin: Secondary | ICD-10-CM | POA: Diagnosis not present

## 2024-01-17 DIAGNOSIS — R296 Repeated falls: Secondary | ICD-10-CM | POA: Diagnosis present

## 2024-01-17 DIAGNOSIS — Z87442 Personal history of urinary calculi: Secondary | ICD-10-CM

## 2024-01-17 DIAGNOSIS — L03115 Cellulitis of right lower limb: Secondary | ICD-10-CM | POA: Diagnosis present

## 2024-01-17 DIAGNOSIS — Z8673 Personal history of transient ischemic attack (TIA), and cerebral infarction without residual deficits: Secondary | ICD-10-CM

## 2024-01-17 DIAGNOSIS — Z7984 Long term (current) use of oral hypoglycemic drugs: Secondary | ICD-10-CM

## 2024-01-17 DIAGNOSIS — K219 Gastro-esophageal reflux disease without esophagitis: Secondary | ICD-10-CM | POA: Diagnosis present

## 2024-01-17 DIAGNOSIS — Z8249 Family history of ischemic heart disease and other diseases of the circulatory system: Secondary | ICD-10-CM

## 2024-01-17 DIAGNOSIS — Z7982 Long term (current) use of aspirin: Secondary | ICD-10-CM

## 2024-01-17 DIAGNOSIS — Z833 Family history of diabetes mellitus: Secondary | ICD-10-CM

## 2024-01-17 LAB — CBC WITH DIFFERENTIAL/PLATELET
Abs Immature Granulocytes: 0.01 10*3/uL (ref 0.00–0.07)
Basophils Absolute: 0 10*3/uL (ref 0.0–0.1)
Basophils Relative: 0 %
Eosinophils Absolute: 0.1 10*3/uL (ref 0.0–0.5)
Eosinophils Relative: 1 %
HCT: 42.7 % (ref 39.0–52.0)
Hemoglobin: 14.5 g/dL (ref 13.0–17.0)
Immature Granulocytes: 0 %
Lymphocytes Relative: 25 %
Lymphs Abs: 1.6 10*3/uL (ref 0.7–4.0)
MCH: 31.6 pg (ref 26.0–34.0)
MCHC: 34 g/dL (ref 30.0–36.0)
MCV: 93 fL (ref 80.0–100.0)
Monocytes Absolute: 0.7 10*3/uL (ref 0.1–1.0)
Monocytes Relative: 12 %
Neutro Abs: 3.9 10*3/uL (ref 1.7–7.7)
Neutrophils Relative %: 62 %
Platelets: 186 10*3/uL (ref 150–400)
RBC: 4.59 MIL/uL (ref 4.22–5.81)
RDW: 14.7 % (ref 11.5–15.5)
WBC: 6.3 10*3/uL (ref 4.0–10.5)
nRBC: 0 % (ref 0.0–0.2)

## 2024-01-17 LAB — COMPREHENSIVE METABOLIC PANEL
ALT: 297 U/L — ABNORMAL HIGH (ref 0–44)
AST: 72 U/L — ABNORMAL HIGH (ref 15–41)
Albumin: 3.5 g/dL (ref 3.5–5.0)
Alkaline Phosphatase: 230 U/L — ABNORMAL HIGH (ref 38–126)
Anion gap: 8 (ref 5–15)
BUN: 17 mg/dL (ref 8–23)
CO2: 22 mmol/L (ref 22–32)
Calcium: 9 mg/dL (ref 8.9–10.3)
Chloride: 107 mmol/L (ref 98–111)
Creatinine, Ser: 0.66 mg/dL (ref 0.61–1.24)
GFR, Estimated: >60 mL/min (ref >60)
Glucose, Bld: 197 mg/dL — ABNORMAL HIGH (ref 70–99)
Potassium: 4.7 mmol/L (ref 3.5–5.1)
Sodium: 137 mmol/L (ref 135–145)
Total Bilirubin: 1.5 mg/dL — ABNORMAL HIGH (ref 0.0–1.2)
Total Protein: 7.1 g/dL (ref 6.5–8.1)

## 2024-01-17 LAB — PROTIME-INR
INR: 1 (ref 0.8–1.2)
Prothrombin Time: 13.4 s (ref 11.4–15.2)

## 2024-01-17 LAB — LACTIC ACID, PLASMA: Lactic Acid, Venous: 2.5 mmol/L (ref 0.5–1.9)

## 2024-01-17 LAB — APTT: aPTT: 30 s (ref 24–36)

## 2024-01-17 MED ORDER — HEPARIN (PORCINE) 25000 UT/250ML-% IV SOLN
1400.0000 [IU]/h | INTRAVENOUS | Status: DC
  Administered 2024-01-17 – 2024-01-18 (×2): 1200 [IU]/h via INTRAVENOUS
  Administered 2024-01-19 – 2024-01-20 (×2): 1400 [IU]/h via INTRAVENOUS
  Filled 2024-01-17 (×4): qty 250

## 2024-01-17 MED ORDER — HEPARIN BOLUS VIA INFUSION
6000.0000 [IU] | Freq: Once | INTRAVENOUS | Status: AC
  Administered 2024-01-17: 6000 [IU] via INTRAVENOUS
  Filled 2024-01-17: qty 6000

## 2024-01-17 MED ORDER — PIPERACILLIN-TAZOBACTAM 3.375 G IVPB
3.3750 g | Freq: Once | INTRAVENOUS | Status: AC
  Administered 2024-01-17: 3.375 g via INTRAVENOUS
  Filled 2024-01-17: qty 50

## 2024-01-17 MED ORDER — VANCOMYCIN HCL IN DEXTROSE 1-5 GM/200ML-% IV SOLN
1000.0000 mg | Freq: Once | INTRAVENOUS | Status: AC
  Administered 2024-01-17: 1000 mg via INTRAVENOUS
  Filled 2024-01-17: qty 200

## 2024-01-17 NOTE — Assessment & Plan Note (Addendum)
 History of acute cholecystitis s/p cholecystostomy tube placement 07/28/2023 -12/11/2023. Denies abdominal pain Consider GI consult

## 2024-01-17 NOTE — H&P (Signed)
 History and Physical    Patient: Alexander [name] HYQ:[id number] DOB: 02-Oct-1951 DOA: 01/17/2024 DOS: the patient was seen and examined on 01/17/2024 PCP: Myrene Buddy, NP  Patient coming from: Home  Chief Complaint:  Chief Complaint  Patient presents with   Wound Infection    HPI: Alexander [name] is a 73 y.o. 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  Patient developed pain redness and swelling to the right foot over the past several days and then he noticed black lesions appearing on the first and second toes.  His PCP saw him 2 days prior and sent him to [facility] vascular surgery and they advised him to go to the emergency room for evaluation.  He did see podiatry the day prior and was started on an antibiotic which he has not yet started.  Patient reports severe pain on touching the affected areas.  He denies fever or chills. ED course and data review: Vitals within normal limits Labs notable for lactic acidosis of 2.5 with normal WBC.  Otherwise notable for abnormal LFTs with AST 72, ALT 297, alk phos 230 and total bili 1.5 X-ray of the right foot shows no acute osseous abnormality. Patient started on Zosyn and vancomycin The ED provider: Spoke with vascular surgeon, Dr. Gilda Crease who recommended heparin and will see in the a.m. but would likely do angiogram on 3/28 due to having a full schedule on 3/27 Hospitalist consulted for admission.   Review of Systems: As mentioned in the history of present illness. All other systems reviewed and are negative.  Past Medical History:  Diagnosis Date   Acute cholecystitis    Anginal pain (HCC)    CHF (congestive heart failure) (HCC)    Chronic lower back pain    Coronary artery disease    DM (diabetes mellitus), type  2 (HCC)    Elevated LFTs    Facial cellulitis    Frequent falls    GERD (gastroesophageal reflux disease)    History of concussion    History of kidney stones    Hypertension    Ischemic cardiomyopathy    Metabolic acidosis    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)    Pneumonia 07/05/2023   admitted to [facility]   Polyneuropathy    S/P CABG x 5 2017   Sepsis (HCC) 07/05/2023   Severe nonproliferative diabetic retinopathy of left eye (HCC)    Stroke (HCC) 2014   mini, tingling right hand   Past Surgical History:  Procedure Laterality Date   CARDIAC CATHETERIZATION Left 08/09/2016   Procedure: Left Heart Cath and Coronary Angiography;  Surgeon: Dalia Heading, MD;  Location: [facility] INVASIVE CV LAB;  Service: Cardiovascular;  Laterality: Left;   CAROTID PTA/STENT INTERVENTION Left 09/28/2023   Procedure: CAROTID PTA/STENT INTERVENTION;  Surgeon: Renford Dills, MD;  Location: [facility] INVASIVE CV LAB;  Service: Cardiovascular;  Laterality: Left;   CATARACT EXTRACTION W/PHACO Right 02/06/2023   Procedure: CATARACT EXTRACTION PHACO AND INTRAOCULAR LENS PLACEMENT (IOC) AHMED TUBE SHUNT W/ TUTOPLAST RIGHT DIABETIC  3.94   00:42.6;  Surgeon: Nevada Crane, MD;  Location: [facility];  Service: Ophthalmology;  Laterality: Right;   COLONOSCOPY WITH PROPOFOL N/A 02/02/2021   Procedure: COLONOSCOPY WITH PROPOFOL;  Surgeon: Regis Bill, MD;  Location: [facility] ENDOSCOPY;  Service:  Endoscopy;  Laterality: N/A;   CORONARY ARTERY BYPASS GRAFT  2017   x5   IR CHOLANGIOGRAM EXISTING TUBE  12/11/2023   IR EXCHANGE BILIARY DRAIN  09/01/2023   IR EXCHANGE BILIARY DRAIN  11/17/2023   IR PERC CHOLECYSTOSTOMY  07/28/2023   IR RADIOLOGIST EVAL & MGMT  11/17/2023   Social History:  reports that he has never smoked. He has never been exposed to tobacco smoke. He has never used smokeless tobacco. He reports that he does not drink alcohol and does not use drugs.  No Known  Allergies  Family History  Problem Relation Age of Onset   Hypertension Mother    Diabetes Mother     Prior to Admission medications   Medication Sig Start Date End Date Taking? Authorizing Provider  acetaminophen (TYLENOL) 500 MG tablet Take 1,000 mg by mouth every 6 (six) hours as needed for mild pain or headache.    [provider]  aspirin EC 81 MG tablet Take 1 tablet (81 mg total) by mouth daily. 09/29/23 09/28/24  Gillis Santa, MD  atorvastatin (LIPITOR) 80 MG tablet Take 1 tablet (80 mg total) by mouth daily. Hold until you see your doctor and have your liver enzymes repeated 07/30/23   Arnetha Courser, MD  carvedilol (COREG) 3.125 MG tablet Take 3.125 mg by mouth 2 (two) times daily with a meal.    [provider]  clopidogrel (PLAVIX) 75 MG tablet Take 1 tablet (75 mg total) by mouth daily. 09/30/23 03/28/24  Gillis Santa, MD  feeding supplement (ENSURE ENLIVE / ENSURE PLUS) LIQD Take 237 mLs by mouth 2 (two) times daily between meals. 07/30/23   Arnetha Courser, MD  LANTUS SOLOSTAR 100 UNIT/ML Solostar Pen Inject 38 Units into the skin at bedtime. 07/14/23   [provider]  OZEMPIC, 2 MG/DOSE, 8 MG/3ML SOPN Inject 2 mg into the skin every 7 (seven) days. Sundays    [provider]  sacubitril-valsartan (ENTRESTO) 24-26 MG Take 1 tablet by mouth every 12 (twelve) hours. 09/29/23 09/28/24  Gillis Santa, MD    Physical Exam: Vitals:   01/17/24 1734 01/17/24 2134 01/17/24 2200 01/17/24 2300  BP:  (!) 139/55 (!) 147/80 (!) 113/57  Pulse:  71 (!) 103 78  Resp:  16 17 12   Temp:  (!) 97.5 F (36.4 C)    TempSrc:  Oral    SpO2:  98% 99% 100%  Weight: 102.1 kg     Height: 6' (1.829 m)      Physical Exam Vitals and nursing note reviewed.  Constitutional:      General: He is not in acute distress. HENT:     Head: Normocephalic and atraumatic.  Cardiovascular:     Rate and Rhythm: Normal rate and regular rhythm.     Heart sounds: Normal heart sounds.   Pulmonary:     Effort: Pulmonary effort is normal.     Breath sounds: Normal breath sounds.  Abdominal:     Palpations: Abdomen is soft.     Tenderness: There is no abdominal tenderness.  Musculoskeletal:     Comments: Pain redness and swelling right foot with black area on first and second toes.  See pics below  Neurological:     Mental Status: Mental status is at baseline.        Labs on Admission: I have personally reviewed following labs and imaging studies  CBC: Recent Labs  Lab 01/17/24 1735  WBC 6.3  NEUTROABS 3.9  HGB 14.5  HCT  42.7  MCV 93.0  PLT 186   Basic Metabolic Panel: Recent Labs  Lab 01/17/24 1735  NA 137  K 4.7  CL 107  CO2 22  GLUCOSE 197*  BUN 17  CREATININE 0.66  CALCIUM 9.0   GFR: Estimated Creatinine Clearance: 104.7 mL/min (by C-G formula based on SCr of 0.66 mg/dL). Liver Function Tests: Recent Labs  Lab 01/17/24 1735  AST 72*  ALT 297*  ALKPHOS 230*  BILITOT 1.5*  PROT 7.1  ALBUMIN 3.5   No results for input(s): "LIPASE", "AMYLASE" in the last 168 hours. No results for input(s): "AMMONIA" in the last 168 hours. Coagulation Profile: Recent Labs  Lab 01/17/24 2130  INR 1.0   Cardiac Enzymes: No results for input(s): "CKTOTAL", "CKMB", "CKMBINDEX", "TROPONINI" in the last 168 hours. BNP (last 3 results) No results for input(s): "PROBNP" in the last 8760 hours. HbA1C: No results for input(s): "HGBA1C" in the last 72 hours. CBG: No results for input(s): "GLUCAP" in the last 168 hours. Lipid Profile: No results for input(s): "CHOL", "HDL", "LDLCALC", "TRIG", "CHOLHDL", "LDLDIRECT" in the last 72 hours. Thyroid Function Tests: No results for input(s): "TSH", "T4TOTAL", "FREET4", "T3FREE", "THYROIDAB" in the last 72 hours. Anemia Panel: No results for input(s): "VITAMINB12", "FOLATE", "FERRITIN", "TIBC", "IRON", "RETICCTPCT" in the last 72 hours. Urine analysis:    Component Value Date/Time   COLORURINE YELLOW (A)  09/27/2023 0100   APPEARANCEUR HAZY (A) 09/27/2023 0100   LABSPEC 1.024 09/27/2023 0100   PHURINE 5.0 09/27/2023 0100   GLUCOSEU 150 (A) 09/27/2023 0100   HGBUR NEGATIVE 09/27/2023 0100   BILIRUBINUR NEGATIVE 09/27/2023 0100   KETONESUR 5 (A) 09/27/2023 0100   PROTEINUR 30 (A) 09/27/2023 0100   NITRITE POSITIVE (A) 09/27/2023 0100   LEUKOCYTESUR SMALL (A) 09/27/2023 0100    Radiological Exams on Admission: DG Foot Complete Right Result Date: 01/17/2024 CLINICAL DATA:  Right-sided foot pain with wounds EXAM: RIGHT FOOT COMPLETE - 3+ VIEW COMPARISON:  None Available. FINDINGS: No fracture or malalignment. Vascular calcifications. Moderate plantar calcaneal spur. Degenerative changes at the first MTP joint. No osseous erosive change. Questionable fracture deformity at the medial malleolus. IMPRESSION: 1. No acute osseous abnormality. 2. Questionable fracture deformity at the medial malleolus. Correlate with dedicated ankle radiographs. Electronically Signed   By: Jasmine Pang M.D.   On: 01/17/2024 21:50     Data Reviewed: Relevant notes from primary care and specialist visits, past discharge summaries as available in EHR, including Care Everywhere. Prior diagnostic testing as pertinent to current admission diagnoses Updated medications and problem lists for reconciliation ED course, including vitals, labs, imaging, treatment and response to treatment Triage notes, nursing and pharmacy notes and ED provider's notes Notable results as noted in HPI   Assessment and Plan: * Critical limb ischemia of right lower extremity (HCC) Cellulitis right foot Continue Zosyn and vancomycin Continue heparin infusion Pain control Vascular consult  Lactic acidosis Suspect related to tissue ischemia, low suspicion for sepsis, not meeting criteria Continue to trend  Elevated LFTs History of acute cholecystitis s/p cholecystostomy tube placement 07/28/2023 -12/11/2023. Denies abdominal  pain Consider GI consult   History of stroke 09/2024 Hyperlipidemia -Will hold Plavix as patient is on heparin - Continue atorvastatin   Chronic HFrEF Ischemic cardiomyopathy  Clinically euvolemic Continue GDMT   Hypertension Continue Home Coreg 1.6 mg p.o. twice daily    Insulin dependent type 2 diabetes mellitus (HCC) A1c was 6.7 on 07/29/23 Insulin SSI with at bedtime coverage ordered  DVT prophylaxis: Heparin infusion  Consults: Vascular, Dr. Gilda Crease  Advance Care Planning:   Code Status: Prior   Family Communication: Wife at bedside  Disposition Plan: Back to previous home environment  Severity of Illness: The appropriate patient status for this patient is INPATIENT. Inpatient status is judged to be reasonable and necessary in order to provide the required intensity of service to ensure the patient's safety. The patient's presenting symptoms, physical exam findings, and initial radiographic and laboratory data in the context of their chronic comorbidities is felt to place them at high risk for further clinical deterioration. Furthermore, it is not anticipated that the patient will be medically stable for discharge from the hospital within 2 midnights of admission.   * I certify that at the point of admission it is my clinical judgment that the patient will require inpatient hospital care spanning beyond 2 midnights from the point of admission due to high intensity of service, high risk for further deterioration and high frequency of surveillance required.*  Author: Andris Baumann, MD 01/17/2024 11:50 PM  For on call review [email or web address].

## 2024-01-17 NOTE — ED Triage Notes (Signed)
 Patient presents with wounds on right foot, great toe and second digit. Patient is diabetic and unsure how long the wounds have been present.

## 2024-01-17 NOTE — ED Notes (Signed)
 Critical Result: Lactic Acid 2.5  Cyril Loosen, MD made aware

## 2024-01-17 NOTE — Assessment & Plan Note (Signed)
 Cellulitis right foot Continue Zosyn and vancomycin Continue heparin infusion Pain control Vascular consult

## 2024-01-17 NOTE — Consult Note (Signed)
 PHARMACY - ANTICOAGULATION CONSULT NOTE  Pharmacy Consult for heparin infusion Indication: limb ischemia  No Known Allergies  Patient Measurements: Height: 6' (182.9 cm) Weight: 102.1 kg (225 lb) IBW/kg (Calculated) : 77.6 HEPARIN DW (KG): 98.5  Vital Signs: Temp: 97.8 F (36.6 C) (03/26 1732) Temp Source: Oral (03/26 1732) BP: 125/74 (03/26 1732) Pulse Rate: 66 (03/26 1732)  Labs: Recent Labs    01/17/24 1735  HGB 14.5  HCT 42.7  PLT 186  CREATININE 0.66    Estimated Creatinine Clearance: 104.7 mL/min (by C-G formula based on SCr of 0.66 mg/dL).   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  PMH includes CAD s/p CABG, T2DM, HTN, CHF, and CVA.  Pharmacy consulted to initiate heparin infusion for limb ischemia.   Goal of Therapy:  Heparin level 0.3-0.7 units/ml Monitor platelets by anticoagulation protocol: Yes   Plan:  Give 6000 units bolus x 1 Start heparin infusion at 1200 units/hr Check anti-Xa level in 8 hours and daily while on heparin Continue to monitor H&H and platelets  Barrie Folk,  PharmD 01/17/2024,9:23 PM

## 2024-01-17 NOTE — ED Provider Notes (Signed)
 [facility] Provider Note    Event Date/Time   First MD Initiated Contact with Patient 01/17/24 2040     (approximate)   History   Chief Complaint: Wound Infection   HPI  Alexander [name] is a 73 y.o. male with a history of stroke, GERD, CHF, hypertension, diabetes who comes ED due to pain and swelling in the right foot.  Reports that over the past several days he has had black lesions appear on the right first and second toes with associated pain and swelling.  No fever or chills, no chest pain or shortness of breath.  Saw PCP 2 days ago, was referred to [facility] vascular surgery who then sent the patient a chart message telling him to go to the ED right away.  Saw podiatry Dr. Ether Griffins yesterday for initial consult.          Physical Exam   Triage Vital Signs: ED Triage Vitals  Encounter Vitals Group     BP 01/17/24 1732 125/74     Systolic BP Percentile --      Diastolic BP Percentile --      Pulse Rate 01/17/24 1732 66     Resp 01/17/24 1732 15     Temp 01/17/24 1732 97.8 F (36.6 C)     Temp Source 01/17/24 1732 Oral     SpO2 01/17/24 1732 100 %     Weight 01/17/24 1734 225 lb (102.1 kg)     Height 01/17/24 1734 6' (1.829 m)     Head Circumference --      Peak Flow --      Pain Score 01/17/24 1733 10     Pain Loc --      Pain Education --      Exclude from Growth Chart --     Most recent vital signs: Vitals:   01/17/24 2200 01/17/24 2300  BP: (!) 147/80 (!) 113/57  Pulse: (!) 103 78  Resp: 17 12  Temp:    SpO2: 99% 100%    General: Awake, no distress.  CV:  Good peripheral perfusion.  No palpable DP or PT pulse.  There is sluggish cap refill in R foot. No palpable DP/PT pulse. biphasic doppler signal @ DP pulse. Resp:  Normal effort.  Abd:  No distention.  Other:  R 1st toe with dry gangrene with 1cm area of necrosis on medial aspect of distal toe.   R 2nd toe with dry gangrene with 3cm area of necrosis on medial aspect of  toe.      ED Results / Procedures / Treatments   Labs (all labs ordered are listed, but only abnormal results are displayed) Labs Reviewed  LACTIC ACID, PLASMA - Abnormal; Notable for the following components:      Result Value   Lactic Acid, Venous 2.5 (*)    All other components within normal limits  COMPREHENSIVE METABOLIC PANEL - Abnormal; Notable for the following components:   Glucose, Bld 197 (*)    AST 72 (*)    ALT 297 (*)    Alkaline Phosphatase 230 (*)    Total Bilirubin 1.5 (*)    All other components within normal limits  CULTURE, BLOOD (SINGLE)  CBC WITH DIFFERENTIAL/PLATELET  APTT  PROTIME-INR  URINALYSIS, W/ REFLEX TO CULTURE (INFECTION SUSPECTED)  HEPARIN LEVEL (UNFRACTIONATED)  CBC     EKG    RADIOLOGY X-ray right foot interpreted by me, negative for subcutaneous gas or erosive changes of the first  and second toe phalanges.  Radiology report reviewed   PROCEDURES:  .Critical Care  Performed by: Sharman Cheek, MD Authorized by: Sharman Cheek, MD   Critical care provider statement:    Critical care time (minutes):  35   Critical care time was exclusive of:  Separately billable procedures and treating other patients   Critical care was necessary to treat or prevent imminent or life-threatening deterioration of the following conditions:  Circulatory failure   Critical care was time spent personally by me on the following activities:  Development of treatment plan with patient or surrogate, discussions with consultants, evaluation of patient's response to treatment, examination of patient, obtaining history from patient or surrogate, ordering and performing treatments and interventions, ordering and review of laboratory studies, ordering and review of radiographic studies, pulse oximetry, re-evaluation of patient's condition and review of old charts   Care discussed with: admitting provider   Comments:          MEDICATIONS ORDERED IN  ED: Medications  heparin ADULT infusion 100 units/mL (25000 units/223mL) (1,200 Units/hr Intravenous New Bag/Given 01/17/24 2203)  vancomycin (VANCOCIN) IVPB 1000 mg/200 mL premix (0 mg Intravenous Stopped 01/17/24 2310)  piperacillin-tazobactam (ZOSYN) IVPB 3.375 g (0 g Intravenous Stopped 01/17/24 2208)  heparin bolus via infusion 6,000 Units (6,000 Units Intravenous Bolus from Bag 01/17/24 2203)     IMPRESSION / MDM / ASSESSMENT AND PLAN / ED COURSE  I reviewed the triage vital signs and the nursing notes.  DDx: Limb ischemia, osteomyelitis, cellulitis, phalangeal fracture  Patient's presentation is most consistent with acute presentation with potential threat to life or bodily function.  Pt p/w necrosis of the 1st and 2nd toes on R foot with associated cellulitis. Suspect tissue ischemia. D/w vascular Dr. Gilda Crease who recommends hospitalization with heparin, abx, and plan for angiography. Will contact hospitalst.    Clinical Course as of 01/17/24 2329  Wed Jan 17, 2024  2123 Elevated lactate is marker of tissue ischemia. Pt is not septic.  [PS]    Clinical Course User Index [PS] Sharman Cheek, MD    ----------------------------------------- 11:29 PM on 01/17/2024 ----------------------------------------- Case discussed with hospitalist   FINAL CLINICAL IMPRESSION(S) / ED DIAGNOSES   Final diagnoses:  Critical limb ischemia of right lower extremity with ulceration of foot (HCC)  Type 2 diabetes mellitus with diabetic peripheral angiopathy and gangrene, with long-term current use of insulin (HCC)     Rx / DC Orders   ED Discharge Orders     None        Note:  This document was prepared using Dragon voice recognition software and may include unintentional dictation errors.   Sharman Cheek, MD 01/17/24 2330

## 2024-01-17 NOTE — Assessment & Plan Note (Signed)
 Suspect related to tissue ischemia, low suspicion for sepsis, not meeting criteria Continue to trend

## 2024-01-18 DIAGNOSIS — I70235 Atherosclerosis of native arteries of right leg with ulceration of other part of foot: Secondary | ICD-10-CM

## 2024-01-18 LAB — CBC
HCT: 38.8 % — ABNORMAL LOW (ref 39.0–52.0)
Hemoglobin: 13.5 g/dL (ref 13.0–17.0)
MCH: 31.5 pg (ref 26.0–34.0)
MCHC: 34.8 g/dL (ref 30.0–36.0)
MCV: 90.7 fL (ref 80.0–100.0)
Platelets: 186 10*3/uL (ref 150–400)
RBC: 4.28 MIL/uL (ref 4.22–5.81)
RDW: 14.6 % (ref 11.5–15.5)
WBC: 5.8 10*3/uL (ref 4.0–10.5)
nRBC: 0 % (ref 0.0–0.2)

## 2024-01-18 LAB — URINALYSIS, W/ REFLEX TO CULTURE (INFECTION SUSPECTED)
Bilirubin Urine: NEGATIVE
Glucose, UA: >=500 mg/dL — AB
Hgb urine dipstick: NEGATIVE
Ketones, ur: NEGATIVE mg/dL
Nitrite: NEGATIVE
Protein, ur: NEGATIVE mg/dL
Specific Gravity, Urine: 1.022 (ref 1.005–1.030)
pH: 5 (ref 5.0–8.0)

## 2024-01-18 LAB — CREATININE, SERUM
Creatinine, Ser: 0.56 mg/dL — ABNORMAL LOW (ref 0.61–1.24)
GFR, Estimated: >60 mL/min (ref >60)

## 2024-01-18 LAB — HEPARIN LEVEL (UNFRACTIONATED)
Heparin Unfractionated: 0.38 [IU]/mL (ref 0.30–0.70)
Heparin Unfractionated: 0.28 [IU]/mL — ABNORMAL LOW (ref 0.30–0.70)

## 2024-01-18 LAB — CBG MONITORING, ED
Glucose-Capillary: 212 mg/dL — ABNORMAL HIGH (ref 70–99)
Glucose-Capillary: 167 mg/dL — ABNORMAL HIGH (ref 70–99)
Glucose-Capillary: 201 mg/dL — ABNORMAL HIGH (ref 70–99)
Glucose-Capillary: 171 mg/dL — ABNORMAL HIGH (ref 70–99)

## 2024-01-18 MED ORDER — MORPHINE SULFATE (PF) 2 MG/ML IV SOLN: 2.0000 mg | INTRAVENOUS | Status: DC | PRN

## 2024-01-18 MED ORDER — ONDANSETRON HCL 4 MG/2ML IJ SOLN
4.0000 mg | Freq: Four times a day (QID) | INTRAMUSCULAR | Status: DC | PRN
  Administered 2024-01-19: 4 mg via INTRAVENOUS
  Filled 2024-01-18: qty 2

## 2024-01-18 MED ORDER — HYDROCODONE-ACETAMINOPHEN 5-325 MG PO TABS: 1.0000 | ORAL_TABLET | ORAL | Status: DC | PRN

## 2024-01-18 MED ORDER — INSULIN ASPART 100 UNIT/ML IJ SOLN
0.0000 [IU] | Freq: Three times a day (TID) | INTRAMUSCULAR | Status: DC
  Administered 2024-01-18: 5 [IU] via SUBCUTANEOUS
  Administered 2024-01-18 – 2024-01-20 (×5): 3 [IU] via SUBCUTANEOUS
  Administered 2024-01-20: 2 [IU] via SUBCUTANEOUS
  Administered 2024-01-21: 5 [IU] via SUBCUTANEOUS
  Administered 2024-01-21 – 2024-01-22 (×2): 3 [IU] via SUBCUTANEOUS
  Filled 2024-01-18 (×9): qty 1

## 2024-01-18 MED ORDER — ACETAMINOPHEN 325 MG PO TABS
650.0000 mg | ORAL_TABLET | Freq: Four times a day (QID) | ORAL | Status: DC | PRN
  Administered 2024-01-19: 650 mg via ORAL
  Filled 2024-01-18: qty 2

## 2024-01-18 MED ORDER — VANCOMYCIN HCL 1500 MG/300ML IV SOLN
1500.0000 mg | Freq: Once | INTRAVENOUS | Status: AC
  Administered 2024-01-18: 1500 mg via INTRAVENOUS
  Filled 2024-01-18: qty 300

## 2024-01-18 MED ORDER — ACETAMINOPHEN 650 MG RE SUPP: 650.0000 mg | Freq: Four times a day (QID) | RECTAL | Status: DC | PRN

## 2024-01-18 MED ORDER — HEPARIN BOLUS VIA INFUSION
1400.0000 [IU] | Freq: Once | INTRAVENOUS | Status: AC
  Administered 2024-01-18: 1400 [IU] via INTRAVENOUS
  Filled 2024-01-18: qty 1400

## 2024-01-18 MED ORDER — ONDANSETRON HCL 4 MG PO TABS
4.0000 mg | ORAL_TABLET | Freq: Four times a day (QID) | ORAL | Status: DC | PRN
Start: 2024-01-18 — End: 2024-01-22

## 2024-01-18 MED ORDER — PIPERACILLIN-TAZOBACTAM 3.375 G IVPB
3.3750 g | Freq: Three times a day (TID) | INTRAVENOUS | Status: DC
  Administered 2024-01-18 – 2024-01-22 (×12): 3.375 g via INTRAVENOUS
  Filled 2024-01-18 (×12): qty 50

## 2024-01-18 MED ORDER — VANCOMYCIN HCL 1500 MG/300ML IV SOLN
1500.0000 mg | Freq: Two times a day (BID) | INTRAVENOUS | Status: DC
  Administered 2024-01-18 – 2024-01-21 (×6): 1500 mg via INTRAVENOUS
  Filled 2024-01-18 (×9): qty 300

## 2024-01-18 MED ORDER — INSULIN GLARGINE-YFGN 100 UNIT/ML ~~LOC~~ SOLN
20.0000 [IU] | Freq: Every day | SUBCUTANEOUS | Status: DC
  Administered 2024-01-18 – 2024-01-22 (×3): 20 [IU] via SUBCUTANEOUS
  Filled 2024-01-18 (×5): qty 0.2

## 2024-01-18 MED ORDER — ATORVASTATIN CALCIUM 80 MG PO TABS
80.0000 mg | ORAL_TABLET | Freq: Every day | ORAL | Status: DC
  Administered 2024-01-18 – 2024-01-22 (×3): 80 mg via ORAL
  Filled 2024-01-18: qty 1
  Filled 2024-01-18: qty 4
  Filled 2024-01-18: qty 1

## 2024-01-18 MED ORDER — ASPIRIN 81 MG PO TBEC
81.0000 mg | DELAYED_RELEASE_TABLET | Freq: Every day | ORAL | Status: DC
  Administered 2024-01-18 – 2024-01-22 (×3): 81 mg via ORAL
  Filled 2024-01-18 (×3): qty 1

## 2024-01-18 MED ORDER — CARVEDILOL 3.125 MG PO TABS
3.1250 mg | ORAL_TABLET | Freq: Two times a day (BID) | ORAL | Status: DC
  Administered 2024-01-18 – 2024-01-22 (×6): 3.125 mg via ORAL
  Filled 2024-01-18 (×7): qty 1

## 2024-01-18 MED ORDER — INSULIN ASPART 100 UNIT/ML IJ SOLN
0.0000 [IU] | Freq: Every day | INTRAMUSCULAR | Status: DC
  Administered 2024-01-18 – 2024-01-21 (×2): 2 [IU] via SUBCUTANEOUS
  Filled 2024-01-18 (×2): qty 1

## 2024-01-18 MED ORDER — SACUBITRIL-VALSARTAN 24-26 MG PO TABS
1.0000 | ORAL_TABLET | Freq: Two times a day (BID) | ORAL | Status: DC
  Administered 2024-01-18 – 2024-01-19 (×3): 1 via ORAL
  Filled 2024-01-18 (×4): qty 1

## 2024-01-18 NOTE — Progress Notes (Signed)
 Pharmacy Antibiotic Note  Alexander [name] is a 73 y.o. male admitted on 01/17/2024 with cellulitis.  Pharmacy has been consulted for Vanc, Zosyn dosing.  Plan: Zosyn 3.375 gm IV X 1 given over 30 min in ED on 3/26 @ 2140. Zosyn 3.375 gm IV Q8H EI ordered to start on 3/27 @ 0400.  Vancomycin 1 gm IV X 1 given in ED on 3/26 @ 2208.  Additional Vanc 1500 mg IV X 1 ordered to make total loading dose of 2500 mg. Vancomycin 1500 mg IV Q12H ordered to start on 3/27 @ 1000.  AUC = 500.4 Vanc trough = 13.9   Height: 6' (182.9 cm) Weight: 102.1 kg (225 lb) IBW/kg (Calculated) : 77.6  Temp (24hrs), Avg:97.7 F (36.5 C), Min:97.5 F (36.4 C), Max:97.8 F (36.6 C)  Recent Labs  Lab 01/17/24 1735 01/17/24 1736  WBC 6.3  --   CREATININE 0.66  --   LATICACIDVEN  --  2.5*    Estimated Creatinine Clearance: 104.7 mL/min (by C-G formula based on SCr of 0.66 mg/dL).    No Known Allergies  Antimicrobials this admission:   >>    >>   Dose adjustments this admission:   Microbiology results:  BCx:   UCx:    Sputum:    MRSA PCR:   Thank you for allowing pharmacy to be a part of this patient's care.  Fancy Dunkley D 01/18/2024 12:20 AM

## 2024-01-18 NOTE — Consult Note (Addendum)
 PHARMACY - ANTICOAGULATION CONSULT NOTE  Pharmacy Consult for heparin infusion Indication: limb ischemia  No Known Allergies  Patient Measurements: Height: 6' (182.9 cm) Weight: 102.1 kg (225 lb) IBW/kg (Calculated) : 77.6 HEPARIN DW (KG): 98.5  Vital Signs: Temp: 98 F (36.7 C) (03/27 1148) Temp Source: Oral (03/27 1148) BP: 122/71 (03/27 1245) Pulse Rate: 66 (03/27 1245)  Labs: Recent Labs    01/17/24 1735 01/17/24 2130 01/18/24 0604 01/18/24 1404 01/18/24 1430  HGB 14.5  --  13.5  --   --   HCT 42.7  --  38.8*  --   --   PLT 186  --  186  --   --   APTT  --  30  --   --   --   LABPROT  --  13.4  --   --   --   INR  --  1.0  --   --   --   HEPARINUNFRC  --   --  0.38  --  0.28*  CREATININE 0.66  --   --  0.56*  --     Estimated Creatinine Clearance: 104.7 mL/min (A) (by C-G formula based on SCr of 0.56 mg/dL (L)).   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  PMH includes CAD s/p CABG, T2DM, HTN, CHF, and CVA.  Pharmacy consulted to initiate heparin infusion for limb ischemia.  DATE/TIME LEVEL Adjustment/Comments   3/27 @ 0604  0.38 Therapeutic x 1- continued @ 1200 units/hr   3/27@  1430  0.28 Subtherapeutic                  Goal of Therapy:  Heparin level 0.3-0.7 units/ml Monitor platelets by anticoagulation protocol: Yes   Plan:  3/27:  HL @ 1430 = 0.28. Level is subtherapeutic.  Will order 1400 unit bolus  Increase heparin infusion to 1400 units/hr  Recheck heparin level 8 hours after rate change  Continue to check CBC and HL daily per protocol    Gardner Candle, PharmD, BCPS Clinical Pharmacist 01/18/2024 3:01 PM

## 2024-01-18 NOTE — Consult Note (Cosign Needed Addendum)
 Hospital Consult    Reason for Consult:  Right Lower extremity Ischemia Requesting Physician:  Dr Sharman Cheek MD  MRN #:  [id number]  History of Present Illness: This is a 73 y.o. male with a history of stroke, GERD, CHF, hypertension, diabetes who comes ED due to pain and swelling in the right foot. Reports that over the past several days he has had black lesions appear on the right first and second toes with associated pain and swelling. No fever or chills, no chest pain or shortness of breath. Saw PCP 2 days ago, was referred to [facility] vascular surgery who then sent the patient a chart message telling him to go to the ED right away. Saw podiatry Dr. Ether Griffins yesterday for initial consult.  Patient was placed on a heparin infusion while in the emergency department.  Vascular surgery consulted to evaluate.  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)    Pneumonia 07/05/2023   admitted to [facility]   Polyneuropathy    S/P CABG x 5 2017   Sepsis (HCC) 07/05/2023   Severe nonproliferative diabetic retinopathy of left eye (HCC)    Stroke (HCC) 2014   mini, tingling right hand    Past Surgical History:  Procedure Laterality Date   CARDIAC CATHETERIZATION Left 08/09/2016   Procedure: Left Heart Cath and Coronary Angiography;  Surgeon: Dalia Heading, MD;  Location: [facility] INVASIVE CV LAB;  Service: Cardiovascular;  Laterality: Left;   CAROTID PTA/STENT INTERVENTION Left 09/28/2023   Procedure: CAROTID PTA/STENT INTERVENTION;  Surgeon:  Renford Dills, MD;  Location: [facility] INVASIVE CV LAB;  Service: Cardiovascular;  Laterality: Left;   CATARACT EXTRACTION W/PHACO Right 02/06/2023   Procedure: CATARACT EXTRACTION PHACO AND INTRAOCULAR LENS PLACEMENT (IOC) AHMED TUBE SHUNT W/ TUTOPLAST RIGHT DIABETIC  3.94   00:42.6;  Surgeon: Nevada Crane, MD;  Location: [facility];  Service: Ophthalmology;  Laterality: Right;   COLONOSCOPY WITH PROPOFOL N/A 02/02/2021   Procedure: COLONOSCOPY WITH PROPOFOL;  Surgeon: Regis Bill, MD;  Location: [facility] ENDOSCOPY;  Service: Endoscopy;  Laterality: N/A;   CORONARY ARTERY BYPASS GRAFT  2017   x5   IR CHOLANGIOGRAM EXISTING TUBE  12/11/2023   IR EXCHANGE BILIARY DRAIN  09/01/2023   IR EXCHANGE BILIARY DRAIN  11/17/2023   IR PERC CHOLECYSTOSTOMY  07/28/2023   IR RADIOLOGIST EVAL & MGMT  11/17/2023    No Known Allergies  Prior to Admission medications   Medication Sig Start Date End Date Taking? Authorizing Provider  acetaminophen (TYLENOL) 500 MG tablet Take 1,000 mg by mouth every 6 (six) hours as needed for mild pain or headache.    [provider]  aspirin EC 81 MG tablet Take 1 tablet (81 mg total) by mouth daily. 09/29/23 09/28/24  Gillis Santa, MD  atorvastatin (LIPITOR) 80 MG tablet Take 1 tablet (80 mg total) by mouth daily. Hold until you see your doctor and have your  liver enzymes repeated 07/30/23   Arnetha Courser, MD  carvedilol (COREG) 3.125 MG tablet Take 3.125 mg by mouth 2 (two) times daily with a meal.    [provider]  clopidogrel (PLAVIX) 75 MG tablet Take 1 tablet (75 mg total) by mouth daily. 09/30/23 03/28/24  Gillis Santa, MD  feeding supplement (ENSURE ENLIVE / ENSURE PLUS) LIQD Take 237 mLs by mouth 2 (two) times daily between meals. 07/30/23   Arnetha Courser, MD  LANTUS SOLOSTAR 100 UNIT/ML Solostar Pen Inject 38 Units into the skin at bedtime. 07/14/23   [provider]  OZEMPIC, 2 MG/DOSE, 8 MG/3ML SOPN Inject 2 mg into the  skin every 7 (seven) days. Sundays    [provider]  sacubitril-valsartan (ENTRESTO) 24-26 MG Take 1 tablet by mouth every 12 (twelve) hours. 09/29/23 09/28/24  Gillis Santa, MD    Social History   Socioeconomic History   Marital status: Married    Spouse name: Not on file   Number of children: Not on file   Years of education: Not on file   Highest education level: Not on file  Occupational History   Not on file  Tobacco Use   Smoking status: Never    Passive exposure: Never   Smokeless tobacco: Never  Vaping Use   Vaping status: Never Used  Substance and Sexual Activity   Alcohol use: No   Drug use: Never   Sexual activity: Yes    Partners: Female  Other Topics Concern   Not on file  Social History Narrative   Not on file   Social Drivers of Health   Financial Resource Strain: Low Risk  (11/22/2023)   Received from [facility]   Overall Financial Resource Strain (CARDIA)    Difficulty of Paying Living Expenses: Not hard at all  Food Insecurity: No Food Insecurity (11/22/2023)   Received from [facility]   Hunger Vital Sign    Worried About Running Out of Food in the Last Year: Never true    Ran Out of Food in the Last Year: Never true  Transportation Needs: No Transportation Needs (11/22/2023)   Received from [facility] - Transportation    In the past 12 months, has lack of transportation kept you from medical appointments or from getting medications?: No    Lack of Transportation (Non-Medical): No  Physical Activity: Not on file  Stress: Not on file  Social Connections: Not on file  Intimate Partner Violence: Not At Risk (09/24/2023)   Humiliation, Afraid, Rape, and Kick questionnaire    Fear of Current or Ex-Partner: No    Emotionally Abused: No    Physically Abused: No    Sexually Abused: No     Family History  Problem Relation Age of Onset   Hypertension Mother    Diabetes Mother      ROS: Otherwise negative unless mentioned in HPI  Physical Examination  Vitals:   01/18/24 0200 01/18/24 0500  BP: 110/64 131/63  Pulse: 68 67  Resp: 17 18  Temp: 97.9 F (36.6 C)   SpO2: 96% 96%   Body mass index is 30.52 kg/m.  General:  WDWN in NAD Gait: Not observed HENT: WNL, normocephalic Pulmonary: normal non-labored breathing, without Rales, rhonchi,  wheezing Cardiac: regular, without  Murmurs, rubs or gallops; without carotid bruits Abdomen: Positive bowel sounds throughout, soft, NT/ND, no masses Skin: without rashes Vascular Exam/Pulses: Bilateral lower extremities warm to touch.  Right  lower extremity foot with +1 to +2 edema.  Right great toe with gangrenous spot about the size of a nickel.  Right second toe lateral side completely gangrene.  Strong Doppler pulses in the right posterior tibial artery and dorsalis pedis.  Skin is reddened and appears cellulitic. Extremities: with ischemic changes, with Gangrene , with cellulitis; without open wounds;  Musculoskeletal: no muscle wasting or atrophy  Neurologic: A&O X 3;  No focal weakness or paresthesias are detected; speech is fluent/normal Psychiatric:  The pt has Normal affect. Lymph:  Unremarkable  CBC    Component Value Date/Time   WBC 5.8 01/18/2024 0604   RBC 4.28 01/18/2024 0604   HGB 13.5 01/18/2024 0604   HCT 38.8 (L) 01/18/2024 0604   PLT 186 01/18/2024 0604   MCV 90.7 01/18/2024 0604   MCH 31.5 01/18/2024 0604   MCHC 34.8 01/18/2024 0604   RDW 14.6 01/18/2024 0604   LYMPHSABS 1.6 01/17/2024 1735   MONOABS 0.7 01/17/2024 1735   EOSABS 0.1 01/17/2024 1735   BASOSABS 0.0 01/17/2024 1735    BMET    Component Value Date/Time   NA 137 01/17/2024 1735   K 4.7 01/17/2024 1735   CL 107 01/17/2024 1735   CO2 22 01/17/2024 1735   GLUCOSE 197 (H) 01/17/2024 1735   BUN 17 01/17/2024 1735   CREATININE 0.66 01/17/2024 1735   CALCIUM 9.0 01/17/2024 1735   GFRNONAA >60 01/17/2024 1735   GFRAA >60  03/17/2019 1306    COAGS: Lab Results  Component Value Date   INR 1.0 01/17/2024   INR 1.2 07/28/2023   INR 1.3 (H) 07/05/2023     Non-Invasive Vascular Imaging:   EXAM: RIGHT FOOT COMPLETE - 3+ VIEW   COMPARISON:  None Available.   FINDINGS: No fracture or malalignment. Vascular calcifications. Moderate plantar calcaneal spur. Degenerative changes at the first MTP joint. No osseous erosive change. Questionable fracture deformity at the medial malleolus.   IMPRESSION: 1. No acute osseous abnormality. 2. Questionable fracture deformity at the medial malleolus. Correlate with dedicated ankle radiographs.  Statin:  Yes.   Beta Blocker:  Yes.   Aspirin:  Yes.   ACEI:  No. ARB:  Yes.   CCB use:  No Other antiplatelets/anticoagulants:  Yes.   Plavix 75 mg Daily    ASSESSMENT/PLAN: This is a 73 y.o. male who presents to [facility] emergency department after being directed to do so by [facility] vascular surgery.  Patient reports over the past several days he had black lesions appear to the right first and second toe associated with pain and swelling.  Upon examination patient noted to have gangrene to the second toe as well as the tip of the great toe.  However patient did have good strong pulses on Doppler to both the posterior tibial and the dorsalis pedis.  He does appear to have cellulitis across his forefoot to include all of his toes.  Patient was placed on a heparin infusion in the emergency department.  Continue the heparin infusion until procedure tomorrow.  Vascular surgery at this time recommends a right lower extremity angiogram with possible intervention.  I discussed at the bedside in detail this morning the procedure, benefits, risk, and complications.  Patient verbalizes understanding and wishes to proceed as soon as possible.  I answered all the patient's questions this morning related to his toes.  Patient will be made n.p.o. after midnight tonight on 01/18/2024 for tomorrow's  procedure.   -I discussed the case in detail with Dr.  [name] MD and he agrees with the plan.   Marcie Bal [facility] 01/18/2024 7:27 AM

## 2024-01-18 NOTE — ED Notes (Signed)
 Pt sitting up in bed eating food tray that was just delivered

## 2024-01-18 NOTE — Consult Note (Signed)
 PHARMACY - ANTICOAGULATION CONSULT NOTE  Pharmacy Consult for heparin infusion Indication: limb ischemia  No Known Allergies  Patient Measurements: Height: 6' (182.9 cm) Weight: 102.1 kg (225 lb) IBW/kg (Calculated) : 77.6 HEPARIN DW (KG): 98.5  Vital Signs: Temp: 97.9 F (36.6 C) (03/27 0200) Temp Source: Oral (03/27 0200) BP: 131/63 (03/27 0500) Pulse Rate: 67 (03/27 0500)  Labs: Recent Labs    01/17/24 1735 01/17/24 2130 01/18/24 0604  HGB 14.5  --  13.5  HCT 42.7  --  38.8*  PLT 186  --  186  APTT  --  30  --   LABPROT  --  13.4  --   INR  --  1.0  --   HEPARINUNFRC  --   --  0.38  CREATININE 0.66  --   --     Estimated Creatinine Clearance: 104.7 mL/min (by C-G formula based on SCr of 0.66 mg/dL).   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  PMH includes CAD s/p CABG, T2DM, HTN, CHF, and CVA.  Pharmacy consulted to initiate heparin infusion for limb ischemia.   Goal of Therapy:  Heparin level 0.3-0.7 units/ml Monitor platelets by anticoagulation protocol: Yes   Plan:  3/27:  HL @ 0604 = 0.38, therapeutic X  1 - will continue pt on current rate and recheck HL in 8 hrs @ 1400.   Aldora Perman D, PharmD 01/18/2024,6:46 AM

## 2024-01-18 NOTE — H&P (View-Only) (Signed)
 Hospital Consult    Reason for Consult:  Right Lower extremity Ischemia Requesting Physician:  Dr Sharman Cheek MD  MRN #:  [id number]  History of Present Illness: This is a 73 y.o. male with a history of stroke, GERD, CHF, hypertension, diabetes who comes ED due to pain and swelling in the right foot. Reports that over the past several days he has had black lesions appear on the right first and second toes with associated pain and swelling. No fever or chills, no chest pain or shortness of breath. Saw PCP 2 days ago, was referred to [facility] vascular surgery who then sent the patient a chart message telling him to go to the ED right away. Saw podiatry Dr. Ether Griffins yesterday for initial consult.  Patient was placed on a heparin infusion while in the emergency department.  Vascular surgery consulted to evaluate.  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)    Pneumonia 07/05/2023   admitted to [facility]   Polyneuropathy    S/P CABG x 5 2017   Sepsis (HCC) 07/05/2023   Severe nonproliferative diabetic retinopathy of left eye (HCC)    Stroke (HCC) 2014   mini, tingling right hand    Past Surgical History:  Procedure Laterality Date   CARDIAC CATHETERIZATION Left 08/09/2016   Procedure: Left Heart Cath and Coronary Angiography;  Surgeon: Dalia Heading, MD;  Location: [facility] INVASIVE CV LAB;  Service: Cardiovascular;  Laterality: Left;   CAROTID PTA/STENT INTERVENTION Left 09/28/2023   Procedure: CAROTID PTA/STENT INTERVENTION;  Surgeon:  Renford Dills, MD;  Location: [facility] INVASIVE CV LAB;  Service: Cardiovascular;  Laterality: Left;   CATARACT EXTRACTION W/PHACO Right 02/06/2023   Procedure: CATARACT EXTRACTION PHACO AND INTRAOCULAR LENS PLACEMENT (IOC) AHMED TUBE SHUNT W/ TUTOPLAST RIGHT DIABETIC  3.94   00:42.6;  Surgeon: Nevada Crane, MD;  Location: [facility];  Service: Ophthalmology;  Laterality: Right;   COLONOSCOPY WITH PROPOFOL N/A 02/02/2021   Procedure: COLONOSCOPY WITH PROPOFOL;  Surgeon: Regis Bill, MD;  Location: [facility] ENDOSCOPY;  Service: Endoscopy;  Laterality: N/A;   CORONARY ARTERY BYPASS GRAFT  2017   x5   IR CHOLANGIOGRAM EXISTING TUBE  12/11/2023   IR EXCHANGE BILIARY DRAIN  09/01/2023   IR EXCHANGE BILIARY DRAIN  11/17/2023   IR PERC CHOLECYSTOSTOMY  07/28/2023   IR RADIOLOGIST EVAL & MGMT  11/17/2023    No Known Allergies  Prior to Admission medications   Medication Sig Start Date End Date Taking? Authorizing Provider  acetaminophen (TYLENOL) 500 MG tablet Take 1,000 mg by mouth every 6 (six) hours as needed for mild pain or headache.    [provider]  aspirin EC 81 MG tablet Take 1 tablet (81 mg total) by mouth daily. 09/29/23 09/28/24  Gillis Santa, MD  atorvastatin (LIPITOR) 80 MG tablet Take 1 tablet (80 mg total) by mouth daily. Hold until you see your doctor and have your  liver enzymes repeated 07/30/23   Arnetha Courser, MD  carvedilol (COREG) 3.125 MG tablet Take 3.125 mg by mouth 2 (two) times daily with a meal.    [provider]  clopidogrel (PLAVIX) 75 MG tablet Take 1 tablet (75 mg total) by mouth daily. 09/30/23 03/28/24  Gillis Santa, MD  feeding supplement (ENSURE ENLIVE / ENSURE PLUS) LIQD Take 237 mLs by mouth 2 (two) times daily between meals. 07/30/23   Arnetha Courser, MD  LANTUS SOLOSTAR 100 UNIT/ML Solostar Pen Inject 38 Units into the skin at bedtime. 07/14/23   [provider]  OZEMPIC, 2 MG/DOSE, 8 MG/3ML SOPN Inject 2 mg into the  skin every 7 (seven) days. Sundays    [provider]  sacubitril-valsartan (ENTRESTO) 24-26 MG Take 1 tablet by mouth every 12 (twelve) hours. 09/29/23 09/28/24  Gillis Santa, MD    Social History   Socioeconomic History   Marital status: Married    Spouse name: Not on file   Number of children: Not on file   Years of education: Not on file   Highest education level: Not on file  Occupational History   Not on file  Tobacco Use   Smoking status: Never    Passive exposure: Never   Smokeless tobacco: Never  Vaping Use   Vaping status: Never Used  Substance and Sexual Activity   Alcohol use: No   Drug use: Never   Sexual activity: Yes    Partners: Female  Other Topics Concern   Not on file  Social History Narrative   Not on file   Social Drivers of Health   Financial Resource Strain: Low Risk  (11/22/2023)   Received from [facility]   Overall Financial Resource Strain (CARDIA)    Difficulty of Paying Living Expenses: Not hard at all  Food Insecurity: No Food Insecurity (11/22/2023)   Received from [facility]   Hunger Vital Sign    Worried About Running Out of Food in the Last Year: Never true    Ran Out of Food in the Last Year: Never true  Transportation Needs: No Transportation Needs (11/22/2023)   Received from [facility]    In the past 12 months, has lack of transportation kept you from medical appointments or from getting medications?: No    Lack of Transportation (Non-Medical): No  Physical Activity: Not on file  Stress: Not on file  Social Connections: Not on file  Intimate Partner Violence: Not At Risk (09/24/2023)   Humiliation, Afraid, Rape, and Kick questionnaire    Fear of Current or Ex-Partner: No    Emotionally Abused: No    Physically Abused: No    Sexually Abused: No     Family History  Problem Relation Age of Onset   Hypertension Mother    Diabetes Mother      ROS: Otherwise negative unless mentioned in HPI  Physical Examination  Vitals:   01/18/24 0200 01/18/24 0500  BP: 110/64 131/63  Pulse: 68 67  Resp: 17 18  Temp: 97.9 F (36.6 C)   SpO2: 96% 96%   Body mass index is 30.52 kg/m.  General:  WDWN in NAD Gait: Not observed HENT: WNL, normocephalic Pulmonary: normal non-labored breathing, without Rales, rhonchi,  wheezing Cardiac: regular, without  Murmurs, rubs or gallops; without carotid bruits Abdomen: Positive bowel sounds throughout, soft, NT/ND, no masses Skin: without rashes Vascular Exam/Pulses: Bilateral lower extremities warm to touch.  Right  lower extremity foot with +1 to +2 edema.  Right great toe with gangrenous spot about the size of a nickel.  Right second toe lateral side completely gangrene.  Strong Doppler pulses in the right posterior tibial artery and dorsalis pedis.  Skin is reddened and appears cellulitic. Extremities: with ischemic changes, with Gangrene , with cellulitis; without open wounds;  Musculoskeletal: no muscle wasting or atrophy  Neurologic: A&O X 3;  No focal weakness or paresthesias are detected; speech is fluent/normal Psychiatric:  The pt has Normal affect. Lymph:  Unremarkable  CBC    Component Value Date/Time   WBC 5.8 01/18/2024 0604   RBC 4.28 01/18/2024 0604   HGB 13.5 01/18/2024 0604   HCT 38.8 (L) 01/18/2024 0604   PLT 186 01/18/2024 0604   MCV 90.7 01/18/2024 0604   MCH 31.5 01/18/2024 0604   MCHC 34.8 01/18/2024 0604   RDW 14.6 01/18/2024 0604   LYMPHSABS 1.6 01/17/2024 1735   MONOABS 0.7 01/17/2024 1735   EOSABS 0.1 01/17/2024 1735   BASOSABS 0.0 01/17/2024 1735    BMET    Component Value Date/Time   NA 137 01/17/2024 1735   K 4.7 01/17/2024 1735   CL 107 01/17/2024 1735   CO2 22 01/17/2024 1735   GLUCOSE 197 (H) 01/17/2024 1735   BUN 17 01/17/2024 1735   CREATININE 0.66 01/17/2024 1735   CALCIUM 9.0 01/17/2024 1735   GFRNONAA >60 01/17/2024 1735   GFRAA >60  03/17/2019 1306    COAGS: Lab Results  Component Value Date   INR 1.0 01/17/2024   INR 1.2 07/28/2023   INR 1.3 (H) 07/05/2023     Non-Invasive Vascular Imaging:   EXAM: RIGHT FOOT COMPLETE - 3+ VIEW   COMPARISON:  None Available.   FINDINGS: No fracture or malalignment. Vascular calcifications. Moderate plantar calcaneal spur. Degenerative changes at the first MTP joint. No osseous erosive change. Questionable fracture deformity at the medial malleolus.   IMPRESSION: 1. No acute osseous abnormality. 2. Questionable fracture deformity at the medial malleolus. Correlate with dedicated ankle radiographs.  Statin:  Yes.   Beta Blocker:  Yes.   Aspirin:  Yes.   ACEI:  No. ARB:  Yes.   CCB use:  No Other antiplatelets/anticoagulants:  Yes.   Plavix 75 mg Daily    ASSESSMENT/PLAN: This is a 73 y.o. male who presents to [facility] emergency department after being directed to do so by [facility] vascular surgery.  Patient reports over the past several days he had black lesions appear to the right first and second toe associated with pain and swelling.  Upon examination patient noted to have gangrene to the second toe as well as the tip of the great toe.  However patient did have good strong pulses on Doppler to both the posterior tibial and the dorsalis pedis.  He does appear to have cellulitis across his forefoot to include all of his toes.  Patient was placed on a heparin infusion in the emergency department.  Continue the heparin infusion until procedure tomorrow.  Vascular surgery at this time recommends a right lower extremity angiogram with possible intervention.  I discussed at the bedside in detail this morning the procedure, benefits, risk, and complications.  Patient verbalizes understanding and wishes to proceed as soon as possible.  I answered all the patient's questions this morning related to his toes.  Patient will be made n.p.o. after midnight tonight on 01/18/2024 for tomorrow's  procedure.   -I discussed the case in detail with Dr.  [name] MD and he agrees with the plan.   Marcie Bal [facility] 01/18/2024 7:27 AM

## 2024-01-18 NOTE — Progress Notes (Signed)
 Progress Note    Alexander [name]  VWU:[id number] DOB: January 29, 1951  DOA: 01/17/2024 PCP: Myrene Buddy, NP      Brief Narrative:    Medical records reviewed and are as summarized below:  Alexander [name] is a 73 y.o. 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  He said he noticed black areas on the right first and second toes several weeks ago, about a month prior to admission.  He then developed pain and redness in the right proximal foot about 2 weeks prior to admission.  Pain was slowly getting worse so he went to see his PCP on 01/15/2024.  He was prescribed Augmentin but he had not started it yet.  He was referred to see a podiatrist.  He saw Dr. Ether Griffins, podiatrist, on 01/16/2024.  He was then referred to see a vascular surgeon but the vascular surgeon referred him to the ED because of concern for critical limb ischemia.      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  (Class I obesity)   Chronic critical limb ischemia, gangrenous right first and second toes, cellulitis right foot:.  Continue IV heparin drip and monitor heparin level per protocol. Continue empiric IV Zosyn and vancomycin.  Neurology is as needed for pain. He has been evaluated by the vascular surgeon.  Plan for lower extremity angiogram tomorrow.   Lactic acidosis: Probably due to ischemic limb.   CAD s/p CABG, history of stroke: Continue aspirin and Lipitor.  Plavix on  hold   Chronic HFrEF, ischemic cardiomyopathy: Compensated. Continue carvedilol and Entresto. 2D echo in November 2024 showed EF estimated at 20 to 25%, grade 2 diastolic dysfunction.   Type II DM: NovoLog as needed for hyperglycemia   Comorbidities include hypertension, hyperlipidemia, history of acute cholecystitis s/p cholecystostomy tube from 07/28/2023 through 12/11/2023, elevated liver enzymes      Diet Order             Diet NPO time specified Except for: Sips with Meds  Diet effective midnight           Diet heart healthy/carb modified Room service appropriate? Yes; Fluid consistency: Thin  Diet effective now                            Consultants: Vascular surgeon  Procedures: None    Medications:    aspirin EC  81 mg Oral Daily   atorvastatin  80 mg Oral Daily   carvedilol  3.125 mg Oral BID WC   insulin aspart  0-15 Units Subcutaneous TID WC   insulin aspart  0-5 Units Subcutaneous QHS   insulin glargine-yfgn  20 Units Subcutaneous Daily   sacubitril-valsartan  1 tablet Oral Q12H   Continuous Infusions:  heparin 1,200 Units/hr (01/17/24 2203)   piperacillin-tazobactam (ZOSYN)  IV Stopped (01/18/24 0750)   vancomycin 1,500 mg (01/18/24 0926)     Anti-infectives (From admission, onward)    Start  Dose/Rate Route Frequency Ordered Stop   01/18/24 1000  vancomycin (VANCOREADY) IVPB 1500 mg/300 mL        1,500 mg 150 mL/hr over 120 Minutes Intravenous Every 12 hours 01/18/24 0019     01/18/24 0400  piperacillin-tazobactam (ZOSYN) IVPB 3.375 g        3.375 g 12.5 mL/hr over 240 Minutes Intravenous Every 8 hours 01/18/24 0018     01/18/24 0015  vancomycin (VANCOREADY) IVPB 1500 mg/300 mL        1,500 mg 150 mL/hr over 120 Minutes Intravenous  Once 01/18/24 0011 01/18/24 0300   01/17/24 2130  vancomycin (VANCOCIN) IVPB 1000 mg/200 mL premix        1,000 mg 200 mL/hr over 60 Minutes Intravenous  Once 01/17/24 2122 01/17/24 2310    01/17/24 2130  piperacillin-tazobactam (ZOSYN) IVPB 3.375 g        3.375 g 100 mL/hr over 30 Minutes Intravenous  Once 01/17/24 2122 01/17/24 2208              Family Communication/Anticipated D/C date and plan/Code Status   DVT prophylaxis:      Code Status: Full Code  Family Communication: None Disposition Plan: Plan to discharge home   Status is: Inpatient Remains inpatient appropriate because: Critical limb ischemia       Subjective:   Interval events noted.  He complains of pain in the right foot.  Objective:    Vitals:   01/18/24 0500 01/18/24 0750 01/18/24 0800 01/18/24 0900  BP: 131/63  91/78 (!) 122/105  Pulse: 67  (!) 118 67  Resp: 18 18 14 17   Temp:  98.2 F (36.8 C)    TempSrc:  Oral    SpO2: 96%  (!) 82% 96%  Weight:      Height:       No data found.   Intake/Output Summary (Last 24 hours) at 01/18/2024 1110 Last data filed at 01/18/2024 0750 Gross per 24 hour  Intake 650 ml  Output --  Net 650 ml   Filed Weights   01/17/24 1734  Weight: 102.1 kg    Exam:  GEN: NAD SKIN: Warm and dry EYES: No pallor or icterus ENT: MMM CV: RRR PULM: CTA B ABD: soft, obese, NT, +BS CNS: AAO x 3, non focal EXT: B/l pedal edema, erythema and tenderness of proximal right foot, dry gangrene of right first and second toes,         Data Reviewed:   I have personally reviewed following labs and imaging studies:  Labs: Labs show the following:   Basic Metabolic Panel: Recent Labs  Lab 01/17/24 1735  NA 137  K 4.7  CL 107  CO2 22  GLUCOSE 197*  BUN 17  CREATININE 0.66  CALCIUM 9.0   GFR Estimated Creatinine Clearance: 104.7 mL/min (by C-G formula based on SCr of 0.66 mg/dL). Liver Function Tests: Recent Labs  Lab 01/17/24 1735  AST 72*  ALT 297*  ALKPHOS 230*  BILITOT 1.5*  PROT 7.1  ALBUMIN 3.5   No results for input(s): "LIPASE", "AMYLASE" in the last 168 hours. No results for input(s): "AMMONIA" in the last 168  hours. Coagulation profile Recent Labs  Lab 01/17/24 2130  INR 1.0    CBC: Recent Labs  Lab 01/17/24 1735 01/18/24 0604  WBC 6.3 5.8  NEUTROABS 3.9  --   HGB 14.5 13.5  HCT 42.7 38.8*  MCV 93.0 90.7  PLT 186 186   Cardiac Enzymes: No results for  input(s): "CKTOTAL", "CKMB", "CKMBINDEX", "TROPONINI" in the last 168 hours. BNP (last 3 results) No results for input(s): "PROBNP" in the last 8760 hours. CBG: Recent Labs  Lab 01/18/24 0059 01/18/24 0746  GLUCAP 212* 167*   D-Dimer: No results for input(s): "DDIMER" in the last 72 hours. Hgb A1c: No results for input(s): "HGBA1C" in the last 72 hours. Lipid Profile: No results for input(s): "CHOL", "HDL", "LDLCALC", "TRIG", "CHOLHDL", "LDLDIRECT" in the last 72 hours. Thyroid function studies: No results for input(s): "TSH", "T4TOTAL", "T3FREE", "THYROIDAB" in the last 72 hours.  Invalid input(s): "FREET3" Anemia work up: No results for input(s): "VITAMINB12", "FOLATE", "FERRITIN", "TIBC", "IRON", "RETICCTPCT" in the last 72 hours. Sepsis Labs: Recent Labs  Lab 01/17/24 1735 01/17/24 1736 01/18/24 0604  WBC 6.3  --  5.8  LATICACIDVEN  --  2.5*  --     Microbiology Recent Results (from the past 240 hours)  Blood culture (single)     Status: None (Preliminary result)   Collection Time: 01/17/24  5:37 PM   Specimen: BLOOD  Result Value Ref Range Status   Specimen Description BLOOD BLOOD LEFT HAND  Final   Special Requests   Final    BOTTLES DRAWN AEROBIC AND ANAEROBIC Blood Culture adequate volume   Culture   Final    NO GROWTH < 24 HOURS Performed at [facility], [address] [place], [place] [address]    Report Status PENDING  Incomplete    Procedures and diagnostic studies:  DG Foot Complete Right Result Date: 01/17/2024 CLINICAL DATA:  Right-sided foot pain with wounds EXAM: RIGHT FOOT COMPLETE - 3+ VIEW COMPARISON:  None Available. FINDINGS: No fracture or malalignment. Vascular  calcifications. Moderate plantar calcaneal spur. Degenerative changes at the first MTP joint. No osseous erosive change. Questionable fracture deformity at the medial malleolus. IMPRESSION: 1. No acute osseous abnormality. 2. Questionable fracture deformity at the medial malleolus. Correlate with dedicated ankle radiographs. Electronically Signed   By: Jasmine Pang M.D.   On: 01/17/2024 21:50               LOS: 1 day   Lonetta Blassingame  [facility]   Pager on [email or web address]. If 7PM-7AM, please contact night-coverage at [email or web address]     01/18/2024, 11:10 AM

## 2024-01-18 NOTE — ED Notes (Signed)
 CCMD called for cardiac monitoring.

## 2024-01-18 NOTE — ED Notes (Signed)
 Pt assisted walking to the bathroom and back without incident, however pt slightly unsteady on his feet. Pt advised the unsteady gait was from lying down for so long.

## 2024-01-19 ENCOUNTER — Encounter
Admission: EM | Disposition: A | Discharge status: Home / Self Care | Payer: Self-pay | Source: Home / Self Care | Attending: Internal Medicine

## 2024-01-19 DIAGNOSIS — I70235 Atherosclerosis of native arteries of right leg with ulceration of other part of foot: Secondary | ICD-10-CM | POA: Diagnosis not present

## 2024-01-19 DIAGNOSIS — I70261 Atherosclerosis of native arteries of extremities with gangrene, right leg: Secondary | ICD-10-CM | POA: Diagnosis not present

## 2024-01-19 DIAGNOSIS — L97518 Non-pressure chronic ulcer of other part of right foot with other specified severity: Secondary | ICD-10-CM | POA: Diagnosis not present

## 2024-01-19 HISTORY — PX: LOWER EXTREMITY ANGIOGRAPHY: CATH118251

## 2024-01-19 LAB — COMPREHENSIVE METABOLIC PANEL WITH GFR
ALT: 179 U/L — ABNORMAL HIGH (ref 0–44)
AST: 40 U/L (ref 15–41)
Albumin: 3.1 g/dL — ABNORMAL LOW (ref 3.5–5.0)
Alkaline Phosphatase: 188 U/L — ABNORMAL HIGH (ref 38–126)
Anion gap: 8 (ref 5–15)
BUN: 13 mg/dL (ref 8–23)
CO2: 23 mmol/L (ref 22–32)
Calcium: 8.6 mg/dL — ABNORMAL LOW (ref 8.9–10.3)
Chloride: 106 mmol/L (ref 98–111)
Creatinine, Ser: 0.66 mg/dL (ref 0.61–1.24)
GFR, Estimated: >60 mL/min (ref >60)
Glucose, Bld: 186 mg/dL — ABNORMAL HIGH (ref 70–99)
Potassium: 3.5 mmol/L (ref 3.5–5.1)
Sodium: 137 mmol/L (ref 135–145)
Total Bilirubin: 1.2 mg/dL (ref 0.0–1.2)
Total Protein: 6.3 g/dL — ABNORMAL LOW (ref 6.5–8.1)

## 2024-01-19 LAB — GLUCOSE, CAPILLARY
Glucose-Capillary: 200 mg/dL — ABNORMAL HIGH (ref 70–99)
Glucose-Capillary: 163 mg/dL — ABNORMAL HIGH (ref 70–99)

## 2024-01-19 LAB — CBC
HCT: 38.7 % — ABNORMAL LOW (ref 39.0–52.0)
Hemoglobin: 13.4 g/dL (ref 13.0–17.0)
MCH: 30.8 pg (ref 26.0–34.0)
MCHC: 34.6 g/dL (ref 30.0–36.0)
MCV: 89 fL (ref 80.0–100.0)
Platelets: 205 10*3/uL (ref 150–400)
RBC: 4.35 MIL/uL (ref 4.22–5.81)
RDW: 14.2 % (ref 11.5–15.5)
WBC: 6 10*3/uL (ref 4.0–10.5)
nRBC: 0 % (ref 0.0–0.2)

## 2024-01-19 LAB — URINE CULTURE: Culture: <10000 — AB

## 2024-01-19 LAB — HEPARIN LEVEL (UNFRACTIONATED)
Heparin Unfractionated: 0.38 [IU]/mL (ref 0.30–0.70)
Heparin Unfractionated: 0.48 [IU]/mL (ref 0.30–0.70)

## 2024-01-19 SURGERY — LOWER EXTREMITY ANGIOGRAPHY
Anesthesia: Moderate Sedation | Laterality: Right

## 2024-01-19 MED ORDER — FENTANYL CITRATE PF 50 MCG/ML IJ SOSY: 12.5000 ug | PREFILLED_SYRINGE | Freq: Once | INTRAMUSCULAR | Status: DC | PRN

## 2024-01-19 MED ORDER — SODIUM CHLORIDE 0.9% FLUSH
3.0000 mL | Freq: Two times a day (BID) | INTRAVENOUS | Status: DC
  Administered 2024-01-19 – 2024-01-22 (×5): 3 mL via INTRAVENOUS

## 2024-01-19 MED ORDER — FENTANYL CITRATE PF 50 MCG/ML IJ SOSY
PREFILLED_SYRINGE | INTRAMUSCULAR | Status: AC
  Filled 2024-01-19: qty 1

## 2024-01-19 MED ORDER — DIPHENHYDRAMINE HCL 50 MG/ML IJ SOLN: 50.0000 mg | Freq: Once | INTRAMUSCULAR | Status: DC | PRN

## 2024-01-19 MED ORDER — SODIUM CHLORIDE 0.9 % IV SOLN: INTRAVENOUS | Status: DC

## 2024-01-19 MED ORDER — SODIUM CHLORIDE 0.9% FLUSH: 3.0000 mL | INTRAVENOUS | Status: DC | PRN

## 2024-01-19 MED ORDER — CEFAZOLIN SODIUM-DEXTROSE 2-4 GM/100ML-% IV SOLN
2.0000 g | INTRAVENOUS | Status: DC
  Filled 2024-01-19: qty 100

## 2024-01-19 MED ORDER — MIDAZOLAM HCL 2 MG/2ML IJ SOLN
INTRAMUSCULAR | Status: DC | PRN
  Administered 2024-01-19: .5 mg via INTRAVENOUS
  Administered 2024-01-19: 1 mg via INTRAVENOUS
  Administered 2024-01-19: 2 mg via INTRAVENOUS
  Administered 2024-01-19 (×3): .5 mg via INTRAVENOUS

## 2024-01-19 MED ORDER — FENTANYL CITRATE (PF) 100 MCG/2ML IJ SOLN
INTRAMUSCULAR | Status: AC
Start: 2024-01-19 — End: ?
  Filled 2024-01-19: qty 2

## 2024-01-19 MED ORDER — CLOPIDOGREL BISULFATE 75 MG PO TABS
75.0000 mg | ORAL_TABLET | Freq: Every day | ORAL | Status: DC
  Administered 2024-01-20 – 2024-01-22 (×3): 75 mg via ORAL
  Filled 2024-01-19 (×3): qty 1

## 2024-01-19 MED ORDER — MIDAZOLAM HCL 2 MG/2ML IJ SOLN
INTRAMUSCULAR | Status: AC
  Filled 2024-01-19: qty 4

## 2024-01-19 MED ORDER — HEPARIN (PORCINE) IN NACL 1000-0.9 UT/500ML-% IV SOLN
INTRAVENOUS | Status: DC | PRN
  Administered 2024-01-19: 500 mL

## 2024-01-19 MED ORDER — HEPARIN SODIUM (PORCINE) 1000 UNIT/ML IJ SOLN
INTRAMUSCULAR | Status: DC | PRN
  Administered 2024-01-19: 6000 [IU] via INTRAVENOUS

## 2024-01-19 MED ORDER — HEPARIN SODIUM (PORCINE) 1000 UNIT/ML IJ SOLN
INTRAMUSCULAR | Status: AC
  Filled 2024-01-19: qty 10

## 2024-01-19 MED ORDER — SODIUM CHLORIDE 0.9 % IV SOLN: INTRAVENOUS | Status: AC

## 2024-01-19 MED ORDER — FAMOTIDINE 20 MG PO TABS: 40.0000 mg | ORAL_TABLET | Freq: Once | ORAL | Status: DC | PRN

## 2024-01-19 MED ORDER — IODIXANOL 320 MG/ML IV SOLN
INTRAVENOUS | Status: DC | PRN
  Administered 2024-01-19: 95 mL via INTRA_ARTERIAL

## 2024-01-19 MED ORDER — MIDAZOLAM HCL 2 MG/ML PO SYRP: 8.0000 mg | ORAL_SOLUTION | Freq: Once | ORAL | Status: DC | PRN

## 2024-01-19 MED ORDER — METHYLPREDNISOLONE SODIUM SUCC 125 MG IJ SOLR: 125.0000 mg | Freq: Once | INTRAMUSCULAR | Status: DC | PRN

## 2024-01-19 MED ORDER — LIDOCAINE HCL (PF) 1 % IJ SOLN
INTRAMUSCULAR | Status: DC | PRN
  Administered 2024-01-19: 10 mL via INTRADERMAL

## 2024-01-19 MED ORDER — SODIUM CHLORIDE 0.9 % IV SOLN: 250.0000 mL | INTRAVENOUS | Status: AC | PRN

## 2024-01-19 MED ORDER — MIDAZOLAM HCL 2 MG/2ML IJ SOLN
INTRAMUSCULAR | Status: AC
  Filled 2024-01-19: qty 2

## 2024-01-19 MED ORDER — FENTANYL CITRATE (PF) 100 MCG/2ML IJ SOLN
INTRAMUSCULAR | Status: DC | PRN
  Administered 2024-01-19 (×2): 25 ug via INTRAVENOUS
  Administered 2024-01-19: 50 ug via INTRAVENOUS
  Administered 2024-01-19 (×2): 25 ug via INTRAVENOUS
  Administered 2024-01-19: 50 ug via INTRAVENOUS

## 2024-01-19 SURGICAL SUPPLY — 39 items
BALLN ARMADA 1.5X40X150 (BALLOONS) ×1 IMPLANT
BALLN ARMADA 2.0X40X150 (BALLOONS) ×1 IMPLANT
BALLN ARMADA 3.0X200X150 (BALLOONS) ×1 IMPLANT
BALLN LUTONIX 018 5X100X130 (BALLOONS) ×1 IMPLANT
BALLN ULTRASCORE 014 2X200X150 (BALLOONS) ×1 IMPLANT
BALLN ULTRVRSE 2.5X80X150 (BALLOONS) ×1 IMPLANT
BALLOON ARMADA 1.5X40X150 (BALLOONS) IMPLANT
BALLOON ARMADA 2.0X40X150 (BALLOONS) IMPLANT
BALLOON ARMADA 3.0X200X150 (BALLOONS) IMPLANT
BALLOON LUTONIX 018 5X100X130 (BALLOONS) IMPLANT
BALLOON ULTRSCRE 014 2X200X150 (BALLOONS) IMPLANT
BALLOON ULTRVRSE 2.5X80X150 (BALLOONS) IMPLANT
CATH AURYON ATHERECTOMY 0.9 (CATHETERS) IMPLANT
CATH BEACON 5 .035 65 KMP TIP (CATHETERS) IMPLANT
CATH CXI SUPP 2.6F 150 ST (CATHETERS) IMPLANT
CATH PIG 70CM (CATHETERS) IMPLANT
CATH SEEKER .018X150 (CATHETERS) IMPLANT
CATH TEMPO 5F RIM 65CM (CATHETERS) IMPLANT
CATH VERT 5FR 125CM (CATHETERS) IMPLANT
COVER PROBE ULTRASOUND 5X96 (MISCELLANEOUS) IMPLANT
DEVICE PRESTO INFLATION (MISCELLANEOUS) IMPLANT
DEVICE STARCLOSE SE CLOSURE (Vascular Products) IMPLANT
GLIDECATH 4FR STR (CATHETERS) IMPLANT
GLIDEWIRE ADV .035X260CM (WIRE) IMPLANT
GLIDEWIRE STIFF .35X180X3 HYDR (WIRE) IMPLANT
GOWN STRL REUS W/ TWL LRG LVL3 (GOWN DISPOSABLE) ×1 IMPLANT
GUIDEWIRE ANGLED .035 180CM (WIRE) IMPLANT
GUIDEWIRE SUPER STIFF .035X180 (WIRE) IMPLANT
NDL ENTRY 21GA 7CM ECHOTIP (NEEDLE) IMPLANT
NEEDLE ENTRY 21GA 7CM ECHOTIP (NEEDLE) ×1 IMPLANT
PACK ANGIOGRAPHY (CUSTOM PROCEDURE TRAY) ×1 IMPLANT
SET INTRO CAPELLA COAXIAL (SET/KITS/TRAYS/PACK) IMPLANT
SHEATH BRITE TIP 5FRX11 (SHEATH) IMPLANT
SHEATH HIGHFLEX ANSEL 6FRX55 (SHEATH) IMPLANT
SYR MEDRAD MARK 7 150ML (SYRINGE) IMPLANT
TUBING CONTRAST HIGH PRESS 72 (TUBING) IMPLANT
WIRE G V18X300CM (WIRE) IMPLANT
WIRE J 3MM .035X145CM (WIRE) IMPLANT
WIRE RUNTHROUGH .014X300CM (WIRE) IMPLANT

## 2024-01-19 NOTE — Progress Notes (Signed)
 Patient off the floor at this time for special procedure.

## 2024-01-19 NOTE — Progress Notes (Addendum)
 Progress Note    Alexander [name]  FAO:[id number] DOB: [birth year]  DOA: 01/17/2024 PCP: Myrene Buddy, NP      Brief Narrative:    Medical records reviewed and are as summarized below:  Alexander [name] is a 73 y.o. 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  He said he noticed black areas on the right first and second toes several weeks ago, about a month prior to admission.  He then developed pain and redness in the right proximal foot about 2 weeks prior to admission.  Pain was slowly getting worse so he went to see his PCP on 01/15/2024.  He was prescribed Augmentin but he had not started it yet.  He was referred to see a podiatrist.  He saw Dr. Ether Griffins, podiatrist, on 01/16/2024.  He was then referred to see a vascular surgeon but the vascular surgeon referred him to the ED because of concern for critical limb ischemia.      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  (Class I obesity)   Chronic critical limb ischemia, gangrenous right first and second toes, cellulitis right foot: S/p percutaneous transluminal angioplasty right popliteal artery, percutaneous laser atherectomy with transluminal angioplasty of right anterior tibial artery on 01/19/2024. Continue IV heparin drip.  Monitor heparin level per protocol. Continue IV Zosyn and vancomycin.   Analgesics as needed for pain. Dr. Ether Griffins, podiatrist, has been consulted.   Lactic acidosis:  Probably due to ischemic limb.   CAD s/p CABG, history of stroke: Continue aspirin and Lipitor.  Plavix on hold   Chronic HFrEF, ischemic cardiomyopathy: Compensated. Continue carvedilol and Entresto. 2D echo in November 2024 showed EF estimated at 20 to 25%, grade 2 diastolic dysfunction.   Type II DM: NovoLog as needed for hyperglycemia   Comorbidities include hypertension, hyperlipidemia, history of acute cholecystitis s/p cholecystostomy tube from 07/28/2023 through 12/11/2023, elevated liver enzymes   S/p percutaneous transluminal angioplasty right popliteal artery, percutaneous laser atherectomy with transluminal angioplasty of right anterior tibial artery on 01/19/2024.   Diet Order             Diet Heart Room service appropriate? Yes; Fluid consistency: Thin  Diet effective now                            Consultants: Vascular surgeon Podiatrist  Procedures: None    Medications:    aspirin EC  81 mg Oral Daily   atorvastatin  80 mg Oral Daily   carvedilol  3.125 mg Oral BID WC   [START ON 01/20/2024] clopidogrel  75 mg Oral Q breakfast   insulin aspart  0-15 Units Subcutaneous TID WC   insulin aspart  0-5 Units Subcutaneous QHS   insulin glargine-yfgn  20 Units Subcutaneous Daily   sacubitril-valsartan  1 tablet Oral Q12H   sodium chloride flush  3 mL Intravenous Q12H   Continuous Infusions:  sodium chloride 40 mL/hr at 01/19/24 1348  sodium chloride     heparin 1,400 Units/hr (01/19/24 1341)   piperacillin-tazobactam (ZOSYN)  IV 3.375 g (01/19/24 0436)   vancomycin Stopped (01/19/24 0040)     Anti-infectives (From admission, onward)    Start     Dose/Rate Route Frequency Ordered Stop   01/19/24 1030  ceFAZolin (ANCEF) IVPB 2g/100 mL premix  Status:  Discontinued        2 g 200 mL/hr over 30 Minutes Intravenous 30 min pre-op 01/19/24 0754 01/19/24 0906   01/18/24 1000  vancomycin (VANCOREADY) IVPB 1500 mg/300 mL        1,500 mg 150  mL/hr over 120 Minutes Intravenous Every 12 hours 01/18/24 0019     01/18/24 0400  piperacillin-tazobactam (ZOSYN) IVPB 3.375 g        3.375 g 12.5 mL/hr over 240 Minutes Intravenous Every 8 hours 01/18/24 0018     01/18/24 0015  vancomycin (VANCOREADY) IVPB 1500 mg/300 mL        1,500 mg 150 mL/hr over 120 Minutes Intravenous  Once 01/18/24 0011 01/18/24 0300   01/17/24 2130  vancomycin (VANCOCIN) IVPB 1000 mg/200 mL premix        1,000 mg 200 mL/hr over 60 Minutes Intravenous  Once 01/17/24 2122 01/17/24 2310   01/17/24 2130  piperacillin-tazobactam (ZOSYN) IVPB 3.375 g        3.375 g 100 mL/hr over 30 Minutes Intravenous  Once 01/17/24 2122 01/17/24 2208              Family Communication/Anticipated D/C date and plan/Code Status   DVT prophylaxis:      Code Status: Full Code  Family Communication: Plan discussed with his wife and Aly (daughter) at the bedside. Disposition Plan: Plan to discharge home   Status is: Inpatient Remains inpatient appropriate because: Critical limb ischemia       Subjective:   Interval events noted.  No new complaints.  He had angiogram of the right lower extremity today.  His wife and daughter were at the bedside.   Objective:    Vitals:   01/19/24 1303 01/19/24 1315 01/19/24 1330 01/19/24 1345  BP: 118/80 131/75 110/72 130/75  Pulse: 79 77 71 75  Resp: 14 19 14  (!) 21  Temp:      TempSrc:      SpO2: 98% 98% 97% 100%  Weight:      Height:       No data found.  No intake or output data in the 24 hours ending 01/19/24 1519  Filed Weights   01/17/24 1734 01/19/24 0401  Weight: 102.1 kg 100.8 kg    Exam:  GEN: NAD SKIN: Warm and dry EYES: No pallor or icterus ENT: MMM CV: RRR PULM: CTA B ABD: soft, ND, NT, +BS CNS: AAO x 3, non focal EXT: Bilateral pedal edema.  Erythema and tenderness of right foot.  Dry gangrene wounds on right first and second toes       Data Reviewed:   I have personally reviewed  following labs and imaging studies:  Labs: Labs show the following:   Basic Metabolic Panel: Recent Labs  Lab 01/17/24 1735 01/18/24 1404 01/19/24 0454  NA 137  --  137  K 4.7  --  3.5  CL 107  --  106  CO2 22  --  23  GLUCOSE 197*  --  186*  BUN 17  --  13  CREATININE 0.66 0.56* 0.66  CALCIUM 9.0  --  8.6*   GFR Estimated  Creatinine Clearance: 104.1 mL/min (by C-G formula based on SCr of 0.66 mg/dL). Liver Function Tests: Recent Labs  Lab 01/17/24 1735 01/19/24 0454  AST 72* 40  ALT 297* 179*  ALKPHOS 230* 188*  BILITOT 1.5* 1.2  PROT 7.1 6.3*  ALBUMIN 3.5 3.1*   No results for input(s): "LIPASE", "AMYLASE" in the last 168 hours. No results for input(s): "AMMONIA" in the last 168 hours. Coagulation profile Recent Labs  Lab 01/17/24 2130  INR 1.0    CBC: Recent Labs  Lab 01/17/24 1735 01/18/24 0604 01/19/24 0454  WBC 6.3 5.8 6.0  NEUTROABS 3.9  --   --   HGB 14.5 13.5 13.4  HCT 42.7 38.8* 38.7*  MCV 93.0 90.7 89.0  PLT 186 186 205   Cardiac Enzymes: No results for input(s): "CKTOTAL", "CKMB", "CKMBINDEX", "TROPONINI" in the last 168 hours. BNP (last 3 results) No results for input(s): "PROBNP" in the last 8760 hours. CBG: Recent Labs  Lab 01/18/24 0059 01/18/24 0746 01/18/24 1119 01/18/24 2231  GLUCAP 212* 167* 201* 171*   D-Dimer: No results for input(s): "DDIMER" in the last 72 hours. Hgb A1c: No results for input(s): "HGBA1C" in the last 72 hours. Lipid Profile: No results for input(s): "CHOL", "HDL", "LDLCALC", "TRIG", "CHOLHDL", "LDLDIRECT" in the last 72 hours. Thyroid function studies: No results for input(s): "TSH", "T4TOTAL", "T3FREE", "THYROIDAB" in the last 72 hours.  Invalid input(s): "FREET3" Anemia work up: No results for input(s): "VITAMINB12", "FOLATE", "FERRITIN", "TIBC", "IRON", "RETICCTPCT" in the last 72 hours. Sepsis Labs: Recent Labs  Lab 01/17/24 1735 01/17/24 1736 01/18/24 0604 01/19/24 0454  WBC 6.3  --   5.8 6.0  LATICACIDVEN  --  2.5*  --   --     Microbiology Recent Results (from the past 240 hours)  Blood culture (single)     Status: None (Preliminary result)   Collection Time: 01/17/24  5:37 PM   Specimen: BLOOD  Result Value Ref Range Status   Specimen Description BLOOD BLOOD LEFT HAND  Final   Special Requests   Final    BOTTLES DRAWN AEROBIC AND ANAEROBIC Blood Culture adequate volume   Culture   Final    NO GROWTH 2 DAYS Performed at [facility], [address] [place], [place] [address]    Report Status PENDING  Incomplete  Urine Culture     Status: Abnormal   Collection Time: 01/18/24  3:39 AM   Specimen: Urine, Random  Result Value Ref Range Status   Specimen Description   Final    URINE, RANDOM Performed at [facility], [address] [place], [place] [address]    Special Requests   Final    NONE Reflexed from [id number] Performed at [facility], [address], [place], [place] [address]    Culture (A)  Final    <10,000 COLONIES/mL INSIGNIFICANT GROWTH Performed at [facility], [address], [place], [place] [address]    Report Status 01/19/2024 FINAL  Final    Procedures and diagnostic studies:  PERIPHERAL VASCULAR CATHETERIZATION Result Date: 01/19/2024 See surgical note for result.  DG Foot Complete Right Result Date: 01/17/2024 CLINICAL DATA:  Right-sided foot pain with wounds EXAM: RIGHT FOOT COMPLETE - 3+ VIEW COMPARISON:  None Available. FINDINGS: No fracture or malalignment. Vascular calcifications. Moderate plantar calcaneal spur. Degenerative changes at the first MTP joint. No osseous erosive change. Questionable fracture deformity at the medial malleolus. IMPRESSION: 1. No acute osseous abnormality. 2. Questionable fracture deformity at the  medial malleolus. Correlate with dedicated ankle radiographs. Electronically Signed   By: Jasmine Pang M.D.   On: 01/17/2024 21:50               LOS: 2 days    Samantha Ragen  [facility]   Pager on [email or web address]. If 7PM-7AM, please contact night-coverage at [email or web address]     01/19/2024, 3:19 PM

## 2024-01-19 NOTE — Consult Note (Signed)
 PHARMACY - ANTICOAGULATION CONSULT NOTE  Pharmacy Consult for heparin infusion Indication: limb ischemia  No Known Allergies  Patient Measurements: Height: 6' (182.9 cm) Weight: 102.1 kg (225 lb) IBW/kg (Calculated) : 77.6 HEPARIN DW (KG): 98.5  Vital Signs: Temp: 98.1 F (36.7 C) (03/27 1915) Temp Source: Oral (03/27 1915) BP: 114/73 (03/27 2347) Pulse Rate: 68 (03/27 2347)  Labs: Recent Labs    01/17/24 1735 01/17/24 2130 01/18/24 0604 01/18/24 1404 01/18/24 1430 01/18/24 2342  HGB 14.5  --  13.5  --   --   --   HCT 42.7  --  38.8*  --   --   --   PLT 186  --  186  --   --   --   APTT  --  30  --   --   --   --   LABPROT  --  13.4  --   --   --   --   INR  --  1.0  --   --   --   --   HEPARINUNFRC  --   --  0.38  --  0.28* 0.38  CREATININE 0.66  --   --  0.56*  --   --     Estimated Creatinine Clearance: 104.7 mL/min (A) (by C-G formula based on SCr of 0.56 mg/dL (L)).   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  PMH includes CAD s/p CABG, T2DM, HTN, CHF, and CVA.  Pharmacy consulted to initiate heparin infusion for  limb ischemia.  DATE/TIME LEVEL Adjustment/Comments  3/27 @ 0604  0.38 Therapeutic x 1- continued @ 1200 units/hr   3/27@  1430  0.28 Subtherapeutic   3/27@  2342  0.38   Therapeutic X 1              Goal of Therapy:  Heparin level 0.3-0.7 units/ml Monitor platelets by anticoagulation protocol: Yes   Plan:  3/27:  HL @ 2342 = 0.38, therapeutic X 1 - Will continue pt on current rate and recheck HL in 8 hrs.  Continue to check CBC and HL daily per protocol    Itha Kroeker D, PharmD Clinical Pharmacist 01/19/2024 12:35 AM

## 2024-01-19 NOTE — Op Note (Signed)
 [facility]  Percutaneous Study/Intervention Procedural Note   Date of Surgery: 01/19/2024  Surgeon:  [name]  Pre-operative Diagnosis: Atherosclerotic occlusive disease bilateral lower extremities with right lower extremity with rest pain and ulceration.  Post-operative diagnosis:  Same  Procedure(s) Performed:             1.  Introduction catheter into right lower extremity 3rd order catheter placement               2.    Contrast injection right lower extremity for distal runoff             3.  Percutaneous transluminal angioplasty right popliteal artery to 5 mm             4.  Percutaneous laser atherectomy with transluminal angioplasty of the anterior tibial artery             5.  Star close closure left common femoral arteriotomy  Anesthesia: Conscious sedation was administered under my direct supervision by the interventional radiology RN. IV Versed plus fentanyl were utilized. Continuous ECG, pulse oximetry and blood pressure was monitored throughout the entire procedure.  Conscious sedation was for a total of 120 minutes.  Sheath: 6 [place] Ansell HyFlex left common femoral retrograde  Contrast: 95 cc  Fluoroscopy Time: 24.3 minutes  Indications:  Alexander [name] presents with increasing pain of the right lower extremity.  He has gangrenous changes to the great toe and the second toe.  This suggests the patient is having limb threatening ischemia. The risks and benefits are reviewed all questions answered patient agrees to proceed.  Procedure:   Alexander [name] is a 73 y.o. y.o. male who was identified and appropriate procedural time out was performed.  The patient was then placed supine on the table and prepped and draped in the usual sterile fashion.    Ultrasound was placed in the sterile sleeve and the left groin was evaluated the left common femoral artery was echolucent and pulsatile indicating patency.  Image was recorded for the permanent  record and under real-time visualization a microneedle was inserted into the common femoral artery followed by the microwire and then the micro-sheath.  A J-wire was then advanced through the micro-sheath and a  5 [place] sheath was then inserted over a J-wire. J-wire was then advanced and a 5 French pigtail catheter was positioned at the level of T12.  AP projection of the aorta was then obtained. Pigtail catheter was repositioned to above the bifurcation and a LAO view of the pelvis was obtained.  Subsequently a rim catheter with an Advantage wire was used to cross the aortic bifurcation.  The catheter and wire were advanced down into the right distal external iliac artery. Oblique view of the femoral bifurcation was then obtained and subsequently the wire was reintroduced and the pigtail catheter negotiated into the SFA representing third order catheter placement. Distal runoff was then performed.  6000 units of heparin was then given and allowed to circulate for several minutes.  A 6 French [place]sell HyFlex sheath was advanced up and over the bifurcation and positioned in the proximal superficial femoral artery  KMP catheter and advantage Glidewire were then negotiated down into the distal popliteal. Catheter was then advanced. Hand injection contrast demonstrated the tibial anatomy in further detail.  A 5 mm x 100 mm Lutonix drug-eluting balloon was used to angioplasty the mid popliteal.  The inflation was for 1 minute at 12 atm. Follow-up  imaging demonstrated patency with less than 10% residual stenosis.  The detector was then positioned distally.  Using a Kumpe catheter and a V18 wire I was able to select the anterior tibial and then cross the occlusion.  Wire was then exchanged for a 0.014 run-through wire.  The [place] 0.9 laser catheter was then used to perform atherectomy of the anterior tibial.  Approximately 5 passes were made the initial pass at 50 pulses per second and then the remaining 4 passes at  60 pulses per second.  Follow-up imaging did demonstrate recanalization however there was a focal area of greater than 80% residual stenosis and this initially was treated with a 1-1/2 mm balloon inflated to 10 atm for 30 seconds and then a 2 mm balloon was used followed by a 2.5 mm balloon and finally a 3 mm balloon was utilized to treat the anterior tibial.  Inflation was to 4 atm for 2 full minutes.  Follow-up imaging now demonstrated the anterior tibial is patent all the way down to the foot.  There is less than 10% residual stenosis.  There is a small nonflow limiting dissection noted as well.  After review of these images the sheath is pulled into the left external iliac oblique of the common femoral is obtained and a Star close device deployed. There no immediate Complications.  Findings:  The abdominal aorta is opacified with a bolus injection contrast. Renal arteries are single and widely patent without evidence of hemodynamically significant stenosis.  The aorta itself has diffuse disease but no hemodynamically significant lesions. The common and external iliac arteries are widely patent bilaterally.  The right common femoral is widely patent as is the profunda femoris.  The SFA does have a diffuse atherosclerotic changes but there are no flow-limiting lesions.  Within the midportion of the popliteal there are 2 lesions of 60 to 70%.  The distal popliteal is patent without flow-limiting stenosis.  The trifurcation demonstrates extensive disease with occlusion of the anterior tibial just a few millimeters distal to the origin.  It reconstitutes approximately 10 to 15 cm distal and is patent down to the foot and fills the pedal arch.  Anterior tibial is the dominant runoff to the foot.  The tibioperoneal trunk is patent however the anterior tibial and peroneal arteries are both occluded.  Posterior tibial does not reconstitute distally.  The peroneal reconstitutes in its midportion and is patent down  to the ankle.  Following atherectomy with angioplasty anterior tibial now is in-line flow and looks quite nice with less than 10% residual stenosis. Angioplasty of the SFA at Hunter's canal yields an excellent result with less than 10% residual stenosis.  Summary: Successful recanalization right lower extremity for limb salvage                           Disposition: Patient was taken to the recovery room in stable condition having tolerated the procedure well.  Alexander [name] 01/19/2024,1:58 PM

## 2024-01-19 NOTE — Plan of Care (Signed)
  Problem: Fluid Volume: Goal: Ability to maintain a balanced intake and output will improve Outcome: Progressing   Problem: Health Behavior/Discharge Planning: Goal: Ability to identify and utilize available resources and services will improve Outcome: Progressing   Problem: Metabolic: Goal: Ability to maintain appropriate glucose levels will improve Outcome: Progressing

## 2024-01-19 NOTE — Interval H&P Note (Signed)
 History and Physical Interval Note:  01/19/2024 10:49 AM  Alexander [name]  has presented today for surgery, with the diagnosis of PAD.  The various methods of treatment have been discussed with the patient and family. After consideration of risks, benefits and other options for treatment, the patient has consented to  Procedure(s): Lower Extremity Angiography (Right) as a surgical intervention.  The patient's history has been reviewed, patient examined, no change in status, stable for surgery.  I have reviewed the patient's chart and labs.  Questions were answered to the patient's satisfaction.     [name]

## 2024-01-19 NOTE — Consult Note (Signed)
 PHARMACY - ANTICOAGULATION CONSULT NOTE  Pharmacy Consult for heparin infusion Indication: limb ischemia  No Known Allergies  Patient Measurements: Height: 6' (182.9 cm) Weight: 100.8 kg (222 lb 4.8 oz) IBW/kg (Calculated) : 77.6 HEPARIN DW (KG): 98.2  Vital Signs: Temp: 98 F (36.7 C) (03/28 0831) Temp Source: Oral (03/28 0831) BP: 129/80 (03/28 0831) Pulse Rate: 77 (03/28 0831)  Labs: Recent Labs    01/17/24 1735 01/17/24 1735 01/17/24 2130 01/18/24 0604 01/18/24 1404 01/18/24 1430 01/18/24 2342 01/19/24 0454 01/19/24 0804  HGB 14.5  --   --  13.5  --   --   --  13.4  --   HCT 42.7  --   --  38.8*  --   --   --  38.7*  --   PLT 186  --   --  186  --   --   --  205  --   APTT  --   --  30  --   --   --   --   --   --   LABPROT  --   --  13.4  --   --   --   --   --   --   INR  --   --  1.0  --   --   --   --   --   --   HEPARINUNFRC  --    < >  --  0.38  --  0.28* 0.38  --  0.48  CREATININE 0.66  --   --   --  0.56*  --   --  0.66  --    < > = values in this interval not displayed.    Estimated Creatinine Clearance: 104.1 mL/min (by C-G formula based on SCr of 0.66 mg/dL).   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  PMH includes CAD s/p CABG, T2DM, HTN, CHF, and CVA.  Pharmacy consulted to initiate heparin infusion for limb ischemia.  DATE/TIME LEVEL Adjustment/Comments  3/27 @ 0604  0.38 Therapeutic x 1- continued @ 1200 units/hr   3/27@  1430  0.28 Subtherapeutic   3/27@  2342  0.38   Therapeutic X 1   3/28 @ 0804 0.48          Goal of Therapy:  Heparin level 0.3-0.7 units/ml Monitor platelets by anticoagulation protocol: Yes   Plan:  Heparin level is therapeutic. Will continue heparin infusion at 1400 units/hr.  Recheck heparin level and CBC with AM labs.   Ronnald Ramp, PharmD Clinical Pharmacist 01/19/2024 8:45 AM

## 2024-01-20 DIAGNOSIS — I70235 Atherosclerosis of native arteries of right leg with ulceration of other part of foot: Secondary | ICD-10-CM | POA: Diagnosis not present

## 2024-01-20 LAB — CBC
HCT: 36.9 % — ABNORMAL LOW (ref 39.0–52.0)
Hemoglobin: 12.7 g/dL — ABNORMAL LOW (ref 13.0–17.0)
MCH: 31.1 pg (ref 26.0–34.0)
MCHC: 34.4 g/dL (ref 30.0–36.0)
MCV: 90.2 fL (ref 80.0–100.0)
Platelets: 207 10*3/uL (ref 150–400)
RBC: 4.09 MIL/uL — ABNORMAL LOW (ref 4.22–5.81)
RDW: 14.5 % (ref 11.5–15.5)
WBC: 6.5 10*3/uL (ref 4.0–10.5)
nRBC: 0 % (ref 0.0–0.2)

## 2024-01-20 LAB — GLUCOSE, CAPILLARY
Glucose-Capillary: 167 mg/dL — ABNORMAL HIGH (ref 70–99)
Glucose-Capillary: 178 mg/dL — ABNORMAL HIGH (ref 70–99)
Glucose-Capillary: 126 mg/dL — ABNORMAL HIGH (ref 70–99)
Glucose-Capillary: 153 mg/dL — ABNORMAL HIGH (ref 70–99)

## 2024-01-20 LAB — HEPARIN LEVEL (UNFRACTIONATED): Heparin Unfractionated: 0.4 [IU]/mL (ref 0.30–0.70)

## 2024-01-20 NOTE — Plan of Care (Signed)
  Problem: Fluid Volume: Goal: Ability to maintain a balanced intake and output will improve Outcome: Progressing   Problem: Health Behavior/Discharge Planning: Goal: Ability to manage health-related needs will improve Outcome: Progressing   Problem: Metabolic: Goal: Ability to maintain appropriate glucose levels will improve Outcome: Progressing   Problem: Nutritional: Goal: Maintenance of adequate nutrition will improve Outcome: Progressing Goal: Progress toward achieving an optimal weight will improve Outcome: Progressing   Problem: Skin Integrity: Goal: Risk for impaired skin integrity will decrease Outcome: Progressing   Problem: Tissue Perfusion: Goal: Adequacy of tissue perfusion will improve Outcome: Progressing   Problem: Skin Integrity: Goal: Skin integrity will improve Outcome: Progressing   Problem: Clinical Measurements: Goal: Respiratory complications will improve Outcome: Progressing Goal: Cardiovascular complication will be avoided Outcome: Progressing   Problem: Activity: Goal: Risk for activity intolerance will decrease Outcome: Progressing   Problem: Elimination: Goal: Will not experience complications related to urinary retention Outcome: Progressing   Problem: Safety: Goal: Ability to remain free from injury will improve Outcome: Progressing   Problem: Skin Integrity: Goal: Risk for impaired skin integrity will decrease Outcome: Progressing

## 2024-01-20 NOTE — Progress Notes (Addendum)
 Progress Note    Alexander [name]  BJY:[id number] DOB: Oct 17, 1951  DOA: 01/17/2024 PCP: Myrene Buddy, NP      Brief Narrative:    Medical records reviewed and are as summarized below:  Alexander [name] is a 73 y.o. 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  He said he noticed black areas on the right first and second toes several weeks ago, about a month prior to admission.  He then developed pain and redness in the right proximal foot about 2 weeks prior to admission.  Pain was slowly getting worse so he went to see his PCP on 01/15/2024.  He was prescribed Augmentin but he had not started it yet.  He was referred to see a podiatrist.  He saw Dr. Ether Griffins, podiatrist, on 01/16/2024.  He was then referred to see a vascular surgeon but the vascular surgeon referred him to the ED because of concern for critical limb ischemia.      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  (Class I obesity)   Chronic critical limb ischemia, gangrenous right first and second toes, cellulitis right foot: S/p percutaneous transluminal angioplasty right popliteal artery, percutaneous laser atherectomy with transluminal angioplasty of right anterior tibial artery on 01/19/2024. Discontinue IV heparin drip. Continue IV vancomycin and Zosyn.  Analgesics as needed for pain. Plavix has been resumed by vascular surgeon.  Continue aspirin and statin Plan for right second toe  amputation tomorrow. Follow-up with Dr. Ether Griffins, podiatrist.   Lactic acidosis: Probably due to ischemic limb.   CAD s/p CABG, history of stroke: Continue aspirin and Lipitor.   Plavix has been resumed by vascular surgeon.   Chronic HFrEF, ischemic cardiomyopathy: Compensated. Continue carvedilol and Entresto. 2D echo in November 2024 showed EF estimated at 20 to 25%, grade 2 diastolic dysfunction.   Type II DM: NovoLog as needed for hyperglycemia   Comorbidities include hypertension, hyperlipidemia, history of acute cholecystitis s/p cholecystostomy tube from 07/28/2023 through 12/11/2023, elevated liver enzymes   Discontinue telemetry. Transfer from progressive care unit to MedSurg unit.   Diet Order             Diet NPO time specified  Diet effective ____           Diet Heart Room service appropriate? Yes; Fluid consistency: Thin  Diet effective now                            Consultants: Vascular surgeon Podiatrist  Procedures: S/p percutaneous transluminal angioplasty right popliteal artery, percutaneous laser atherectomy with transluminal angioplasty of right anterior tibial artery on 01/19/2024.    Medications:    aspirin EC  81 mg Oral Daily   atorvastatin  80 mg Oral Daily   carvedilol  3.125 mg Oral BID WC   clopidogrel  75 mg Oral Q breakfast   insulin aspart  0-15 Units Subcutaneous TID WC   insulin aspart  0-5 Units Subcutaneous QHS  insulin glargine-yfgn  20 Units Subcutaneous Daily   sodium chloride flush  3 mL Intravenous Q12H   Continuous Infusions:  sodium chloride     heparin 1,400 Units/hr (01/20/24 0845)   piperacillin-tazobactam (ZOSYN)  IV 3.375 g (01/20/24 1249)   vancomycin 1,500 mg (01/20/24 0835)     Anti-infectives (From admission, onward)    Start     Dose/Rate Route Frequency Ordered Stop   01/19/24 1030  ceFAZolin (ANCEF) IVPB 2g/100 mL premix  Status:  Discontinued        2 g 200 mL/hr over 30 Minutes  Intravenous 30 min pre-op 01/19/24 0754 01/19/24 0906   01/18/24 1000  vancomycin (VANCOREADY) IVPB 1500 mg/300 mL        1,500 mg 150 mL/hr over 120 Minutes Intravenous Every 12 hours 01/18/24 0019     01/18/24 0400  piperacillin-tazobactam (ZOSYN) IVPB 3.375 g        3.375 g 12.5 mL/hr over 240 Minutes Intravenous Every 8 hours 01/18/24 0018     01/18/24 0015  vancomycin (VANCOREADY) IVPB 1500 mg/300 mL        1,500 mg 150 mL/hr over 120 Minutes Intravenous  Once 01/18/24 0011 01/18/24 0300   01/17/24 2130  vancomycin (VANCOCIN) IVPB 1000 mg/200 mL premix        1,000 mg 200 mL/hr over 60 Minutes Intravenous  Once 01/17/24 2122 01/17/24 2310   01/17/24 2130  piperacillin-tazobactam (ZOSYN) IVPB 3.375 g        3.375 g 100 mL/hr over 30 Minutes Intravenous  Once 01/17/24 2122 01/17/24 2208              Family Communication/Anticipated D/C date and plan/Code Status   DVT prophylaxis:      Code Status: Full Code  Family Communication: None Disposition Plan: Plan to discharge home   Status is: Inpatient Remains inpatient appropriate because: Critical limb ischemia       Subjective:   Interval events noted.  He has no complaints.  He only feels pain in the right foot when he touches it.  No shortness of breath or chest pain   Objective:    Vitals:   01/19/24 2319 01/20/24 0422 01/20/24 0815 01/20/24 1209  BP: (!) 110/45 (!) 98/59 (!) 86/62 113/67  Pulse: 92 85 80 68  Resp: 18 18 14 18   Temp: (!) 97.4 F (36.3 C) 98.3 F (36.8 C) 97.6 F (36.4 C) 97.7 F (36.5 C)  TempSrc:   Oral Oral  SpO2: 95% 95% 100% 98%  Weight:      Height:       No data found.   Intake/Output Summary (Last 24 hours) at 01/20/2024 1507 Last data filed at 01/20/2024 1100 Gross per 24 hour  Intake 1352.27 ml  Output 3 ml  Net 1349.27 ml    Filed Weights   01/17/24 1734 01/19/24 0401  Weight: 102.1 kg 100.8 kg    Exam:  GEN: NAD SKIN: Warm and dry EYES: No pallor or  icterus ENT: MMM CV: RRR PULM: CTA B ABD: soft, ND, NT, +BS CNS: AAO x 3, non focal EXT: Erythema and tenderness of proximal right foot.  Mild bilateral pedal swelling.  Dry gangrene wounds on the right first and second toes.       Data Reviewed:   I have personally reviewed following labs and imaging studies:  Labs: Labs show the following:   Basic Metabolic Panel: Recent Labs  Lab 01/17/24 1735 01/18/24 1404 01/19/24 0454  NA 137  --  137  K 4.7  --  3.5  CL 107  --  106  CO2 22  --  23  GLUCOSE 197*  --  186*  BUN 17  --  13  CREATININE 0.66 0.56* 0.66  CALCIUM 9.0  --  8.6*   GFR Estimated Creatinine Clearance: 104.1 mL/min (by C-G formula based on SCr of 0.66 mg/dL). Liver Function Tests: Recent Labs  Lab 01/17/24 1735 01/19/24 0454  AST 72* 40  ALT 297* 179*  ALKPHOS 230* 188*  BILITOT 1.5* 1.2  PROT 7.1 6.3*  ALBUMIN 3.5 3.1*   No results for input(s): "LIPASE", "AMYLASE" in the last 168 hours. No results for input(s): "AMMONIA" in the last 168 hours. Coagulation profile Recent Labs  Lab 01/17/24 2130  INR 1.0    CBC: Recent Labs  Lab 01/17/24 1735 01/18/24 0604 01/19/24 0454 01/20/24 0515  WBC 6.3 5.8 6.0 6.5  NEUTROABS 3.9  --   --   --   HGB 14.5 13.5 13.4 12.7*  HCT 42.7 38.8* 38.7* 36.9*  MCV 93.0 90.7 89.0 90.2  PLT 186 186 205 207   Cardiac Enzymes: No results for input(s): "CKTOTAL", "CKMB", "CKMBINDEX", "TROPONINI" in the last 168 hours. BNP (last 3 results) No results for input(s): "PROBNP" in the last 8760 hours. CBG: Recent Labs  Lab 01/18/24 2231 01/19/24 1714 01/19/24 2128 01/20/24 0812 01/20/24 1212  GLUCAP 171* 200* 163* 167* 178*   D-Dimer: No results for input(s): "DDIMER" in the last 72 hours. Hgb A1c: No results for input(s): "HGBA1C" in the last 72 hours. Lipid Profile: No results for input(s): "CHOL", "HDL", "LDLCALC", "TRIG", "CHOLHDL", "LDLDIRECT" in the last 72 hours. Thyroid function  studies: No results for input(s): "TSH", "T4TOTAL", "T3FREE", "THYROIDAB" in the last 72 hours.  Invalid input(s): "FREET3" Anemia work up: No results for input(s): "VITAMINB12", "FOLATE", "FERRITIN", "TIBC", "IRON", "RETICCTPCT" in the last 72 hours. Sepsis Labs: Recent Labs  Lab 01/17/24 1735 01/17/24 1736 01/18/24 0604 01/19/24 0454 01/20/24 0515  WBC 6.3  --  5.8 6.0 6.5  LATICACIDVEN  --  2.5*  --   --   --     Microbiology Recent Results (from the past 240 hours)  Blood culture (single)     Status: None (Preliminary result)   Collection Time: 01/17/24  5:37 PM   Specimen: BLOOD  Result Value Ref Range Status   Specimen Description BLOOD BLOOD LEFT HAND  Final   Special Requests   Final    BOTTLES DRAWN AEROBIC AND ANAEROBIC Blood Culture adequate volume   Culture   Final    NO GROWTH 3 DAYS Performed at [facility], [address] [place], [place] [address]    Report Status PENDING  Incomplete  Urine Culture     Status: Abnormal   Collection Time: 01/18/24  3:39 AM   Specimen: Urine, Random  Result Value Ref Range Status   Specimen Description   Final    URINE, RANDOM Performed at [facility], [address] [place], [place] [address]    Special Requests   Final    NONE Reflexed from [id number] Performed at [facility], [address], [place], [place] [address]    Culture (A)  Final    <10,000 COLONIES/mL INSIGNIFICANT GROWTH Performed at [facility], [address] [place], [place] [address]    Report Status 01/19/2024 FINAL  Final    Procedures and diagnostic studies:  PERIPHERAL VASCULAR CATHETERIZATION Result Date: 01/19/2024 See surgical  note for result.              LOS: 3 days   Adalyn Pennock  [facility]   Pager on [email or web address]. If 7PM-7AM, please contact night-coverage at [email or web address]     01/20/2024, 3:07 PM

## 2024-01-20 NOTE — Consult Note (Signed)
 ORTHOPAEDIC CONSULTATION  REQUESTING PHYSICIAN: Lurene Shadow, MD  Chief Complaint: Gangrene right great toe and second toe  HPI: Alexander [name] is a 73 y.o. male who complains of worsening gangrenous changes to his right great toe and second toe.  Seen in the outpatient clinic by myself earlier this week.  Started on antibiotics and consult for vascular surgery was performed at that time.  Recommendation was for evaluation to the ER for revascularization.  Patient underwent revascularization successfully by vascular surgery yesterday. Flow was noted of the right lower extremity for limb salvage.  Patient longstanding history of diabetes with peripheral vascular disease.  Patient noticed less redness to his right foot from previous as well.  On a round of antibiotics since this week.  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)    Pneumonia 07/05/2023   admitted to [facility]   Polyneuropathy    S/P CABG x 5 2017   Sepsis (HCC) 07/05/2023   Severe nonproliferative diabetic retinopathy of left eye (HCC)    Stroke (HCC) 2014   mini, tingling right hand   Past Surgical History:  Procedure Laterality Date   CARDIAC CATHETERIZATION Left 08/09/2016   Procedure: Left Heart Cath and Coronary Angiography;  Surgeon: Dalia Heading, MD;  Location: [facility] INVASIVE CV LAB;  Service: Cardiovascular;  Laterality: Left;   CAROTID PTA/STENT INTERVENTION Left 09/28/2023   Procedure: CAROTID PTA/STENT INTERVENTION;  Surgeon: Renford Dills, MD;   Location: [facility] INVASIVE CV LAB;  Service: Cardiovascular;  Laterality: Left;   CATARACT EXTRACTION W/PHACO Right 02/06/2023   Procedure: CATARACT EXTRACTION PHACO AND INTRAOCULAR LENS PLACEMENT (IOC) AHMED TUBE SHUNT W/ TUTOPLAST RIGHT DIABETIC  3.94   00:42.6;  Surgeon: Nevada Crane, MD;  Location: [facility];  Service: Ophthalmology;  Laterality: Right;   COLONOSCOPY WITH PROPOFOL N/A 02/02/2021   Procedure: COLONOSCOPY WITH PROPOFOL;  Surgeon: Regis Bill, MD;  Location: [facility] ENDOSCOPY;  Service: Endoscopy;  Laterality: N/A;   CORONARY ARTERY BYPASS GRAFT  2017   x5   IR CHOLANGIOGRAM EXISTING TUBE  12/11/2023   IR EXCHANGE BILIARY DRAIN  09/01/2023   IR EXCHANGE BILIARY DRAIN  11/17/2023   IR PERC CHOLECYSTOSTOMY  07/28/2023   IR RADIOLOGIST EVAL & MGMT  11/17/2023   Social History   Socioeconomic History   Marital status: Married    Spouse name: Not on file   Number of children: Not on file   Years of education: Not on file   Highest education level: Not on file  Occupational History   Not on file  Tobacco Use   Smoking status: Never    Passive exposure: Never   Smokeless tobacco: Never  Vaping Use   Vaping status: Never Used  Substance and Sexual Activity   Alcohol use: No   Drug use: Never   Sexual activity: Yes    Partners: Female  Other Topics Concern   Not on file  Social History Narrative   Not on file   Social Drivers of Health   Financial Resource Strain: Low Risk  (11/22/2023)   Received from [facility]   Overall Financial Resource Strain (CARDIA)    Difficulty of Paying Living Expenses: Not hard at all  Food Insecurity: No Food Insecurity (01/19/2024)   Hunger Vital Sign    Worried About Running Out of Food in the Last Year: Never true    Ran Out of Food in the Last Year: Never true  Transportation Needs: No Transportation Needs (01/19/2024)   PRAPARE - Administrator, Civil Service (Medical): No    Lack  of Transportation (Non-Medical): No  Physical Activity: Not on file  Stress: Not on file  Social Connections: Socially Integrated (01/19/2024)   Social Connection and Isolation Panel [NHANES]    Frequency of Communication with Friends and Family: Three times a week    Frequency of Social Gatherings with Friends and Family: Twice a week    Attends Religious Services: 1 to 4 times per year    Active Member of [employer] or Organizations: Yes    Attends Banker Meetings: 1 to 4 times per year    Marital Status: Married   Family History  Problem Relation Age of Onset   Hypertension Mother    Diabetes Mother    No Known Allergies Prior to Admission medications   Medication Sig Start Date End Date Taking? Authorizing Provider  acetaminophen (TYLENOL) 500 MG tablet Take 1,000 mg by mouth every 6 (six) hours as needed for mild pain or headache.   Yes [provider]  amoxicillin-clavulanate (AUGMENTIN) 875-125 MG tablet Take 1 tablet by mouth 2 (two) times daily. 01/15/24 01/25/24 Yes [provider]  aspirin EC 81 MG tablet Take 1 tablet (81 mg total) by mouth daily. 09/29/23 09/28/24 Yes Gillis Santa, MD  atorvastatin (LIPITOR) 80 MG tablet Take 1 tablet (80 mg total) by mouth daily. Hold until you see your doctor and have your liver enzymes repeated 07/30/23  Yes Arnetha Courser, MD  clopidogrel (PLAVIX) 75 MG tablet Take 1 tablet (75 mg total) by mouth daily. 09/30/23 03/28/24 Yes Gillis Santa, MD  HYDROcodone-acetaminophen (NORCO/VICODIN) 5-325 MG tablet Take 1-2 tablets by mouth every 6 (six) hours as needed for moderate pain (pain score 4-6) or severe pain (pain score 7-10). 01/17/24  Yes [provider]  JARDIANCE 25 MG TABS tablet Take 25 mg by mouth daily. 12/27/23  Yes [provider]  LANTUS SOLOSTAR 100 UNIT/ML Solostar Pen Inject 38 Units into the skin at bedtime. 07/14/23  Yes [provider]  OZEMPIC, 2 MG/DOSE, 8 MG/3ML SOPN Inject 2 mg into  the skin every 7 (seven) days. Sundays   Yes [provider]  sacubitril-valsartan (ENTRESTO) 24-26 MG Take 1 tablet by mouth every 12 (twelve) hours. 09/29/23 09/28/24 Yes Gillis Santa, MD  spironolactone (ALDACTONE) 25 MG tablet Take 12.5 mg by mouth daily.   Yes [provider]  carvedilol (COREG) 3.125 MG tablet Take 3.125 mg by mouth 2 (two) times daily with a meal. Patient not taking: Reported on 01/18/2024    [provider]  feeding supplement (ENSURE ENLIVE / ENSURE PLUS) LIQD Take 237 mLs by mouth 2 (two) times daily between meals. 07/30/23   Arnetha Courser, MD   PERIPHERAL VASCULAR CATHETERIZATION Result Date: 01/19/2024 See surgical note for result.   Positive ROS: All other systems have been reviewed and were otherwise negative with the  exception of those mentioned in the HPI and as above.  12 point ROS was performed.  Physical Exam: General: Alert and oriented.  No apparent distress.  Vascular:  Left foot:Dorsalis Pedis:  absent Posterior Tibial:  absent  Right foot: Dorsalis Pedis:  absent Posterior Tibial:  absent  Neuro:absent protective sensation but does have gross sensation intact.  Derm: The medial one half of the right great toe is gangrenous.  There is some residual erythema at the MTPJ itself.  No drainage at this time.  Consistent with dry gangrene.  Has a necrotic ulcer on the distal medial aspect of the great toe.  Measures approximately 1-1/2 cm in diameter.  No erythema to the right great toe at this time.  No active drainage.  Ortho/MS: Edema to the right foot status post revascularization.  Assessment: Gangrene right second toe with necrotic ulceration right great toe Diabetes with peripheral vascular disease  Plan: Patient is status post revascularization yesterday.  At this time I would recommend amputation of the right second toe given the extent of the necrotic tissue to the second toe.  He also has some residual erythema.   Would perform a debridement of the ulcerative site on the distal medial great toe for local wound care.  Discussed the risk and benefits associated with surgery including nonhealing of the wound and need for further surgery.  Discussed this is likely the opportune time to perform the amputation of the second toe given his recent revascularization and optimization from a vascular standpoint.  Will plan on surgery tomorrow morning.  Orders will be placed now.    Irean Hong, DPM Cell [phone]   01/20/2024 9:56 AM

## 2024-01-20 NOTE — Plan of Care (Signed)

## 2024-01-20 NOTE — Consult Note (Signed)
 PHARMACY - ANTICOAGULATION CONSULT NOTE  Pharmacy Consult for heparin infusion Indication: limb ischemia  No Known Allergies  Patient Measurements: Height: 6' (182.9 cm) Weight: 100.8 kg (222 lb 4.8 oz) IBW/kg (Calculated) : 77.6 HEPARIN DW (KG): 98.2  Vital Signs: Temp: 98.3 F (36.8 C) (03/29 0422) BP: 98/59 (03/29 0422) Pulse Rate: 85 (03/29 0422)  Labs: Recent Labs    01/17/24 1735 01/17/24 2130 01/18/24 0604 01/18/24 1404 01/18/24 1430 01/18/24 2342 01/19/24 0454 01/19/24 0804 01/20/24 0515  HGB 14.5  --  13.5  --   --   --  13.4  --  12.7*  HCT 42.7  --  38.8*  --   --   --  38.7*  --  36.9*  PLT 186  --  186  --   --   --  205  --  207  APTT  --  30  --   --   --   --   --   --   --   LABPROT  --  13.4  --   --   --   --   --   --   --   INR  --  1.0  --   --   --   --   --   --   --   HEPARINUNFRC  --   --  0.38  --    < > 0.38  --  0.48 0.40  CREATININE 0.66  --   --  0.56*  --   --  0.66  --   --    < > = values in this interval not displayed.    Estimated Creatinine Clearance: 104.1 mL/min (by C-G formula based on SCr of 0.66 mg/dL).   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  PMH includes CAD s/p CABG, T2DM, HTN, CHF, and CVA.  Pharmacy consulted to initiate heparin infusion for limb ischemia.  DATE/TIME LEVEL Adjustment/Comments  3/27 @ 0604  0.38 Therapeutic x 1- continued @ 1200 units/hr   3/27@  1430  0.28 Subtherapeutic   3/27@  2342  0.38   Therapeutic X 1   3/28 @ 0804 0.48 Therapeutic X 2  3/29 @ 0515 0.40 Therapeutic X 3      Goal of Therapy:  Heparin level 0.3-0.7 units/ml Monitor platelets by anticoagulation protocol: Yes   Plan:  Heparin level is therapeutic. Will continue heparin infusion at 1400 units/hr. Recheck heparin level and  CBC with AM labs.   Scherrie Gerlach, PharmD Clinical Pharmacist 01/20/2024 6:21 AM

## 2024-01-21 ENCOUNTER — Inpatient Hospital Stay: Admitting: Anesthesiology

## 2024-01-21 ENCOUNTER — Other Ambulatory Visit: Payer: Self-pay

## 2024-01-21 ENCOUNTER — Encounter
Admission: EM | Disposition: A | Discharge status: Home / Self Care | Payer: Self-pay | Source: Home / Self Care | Attending: Internal Medicine

## 2024-01-21 DIAGNOSIS — I70235 Atherosclerosis of native arteries of right leg with ulceration of other part of foot: Secondary | ICD-10-CM | POA: Diagnosis not present

## 2024-01-21 HISTORY — PX: AMPUTATION TOE: SHX6595

## 2024-01-21 HISTORY — PX: IRRIGATION AND DEBRIDEMENT FOOT: SHX6602

## 2024-01-21 LAB — BASIC METABOLIC PANEL WITH GFR
Anion gap: 7 (ref 5–15)
BUN: 17 mg/dL (ref 8–23)
CO2: 21 mmol/L — ABNORMAL LOW (ref 22–32)
Calcium: 8.5 mg/dL — ABNORMAL LOW (ref 8.9–10.3)
Chloride: 108 mmol/L (ref 98–111)
Creatinine, Ser: 0.79 mg/dL (ref 0.61–1.24)
GFR, Estimated: >60 mL/min (ref >60)
Glucose, Bld: 203 mg/dL — ABNORMAL HIGH (ref 70–99)
Potassium: 3.5 mmol/L (ref 3.5–5.1)
Sodium: 136 mmol/L (ref 135–145)

## 2024-01-21 LAB — GLUCOSE, CAPILLARY
Glucose-Capillary: 167 mg/dL — ABNORMAL HIGH (ref 70–99)
Glucose-Capillary: 168 mg/dL — ABNORMAL HIGH (ref 70–99)
Glucose-Capillary: 176 mg/dL — ABNORMAL HIGH (ref 70–99)
Glucose-Capillary: 250 mg/dL — ABNORMAL HIGH (ref 70–99)
Glucose-Capillary: 235 mg/dL — ABNORMAL HIGH (ref 70–99)

## 2024-01-21 SURGERY — AMPUTATION, TOE
Anesthesia: General | Site: Toe | Laterality: Right

## 2024-01-21 MED ORDER — DEXAMETHASONE SODIUM PHOSPHATE 10 MG/ML IJ SOLN
INTRAMUSCULAR | Status: DC | PRN
  Administered 2024-01-21: 5 mg via INTRAVENOUS

## 2024-01-21 MED ORDER — OXYCODONE HCL 5 MG/5ML PO SOLN: 5.0000 mg | Freq: Once | ORAL | Status: DC | PRN

## 2024-01-21 MED ORDER — SUCCINYLCHOLINE CHLORIDE 200 MG/10ML IV SOSY
PREFILLED_SYRINGE | INTRAVENOUS | Status: DC | PRN
  Administered 2024-01-21: 100 mg via INTRAVENOUS

## 2024-01-21 MED ORDER — 0.9 % SODIUM CHLORIDE (POUR BTL) OPTIME
TOPICAL | Status: DC | PRN
  Administered 2024-01-21: 1000 mL

## 2024-01-21 MED ORDER — FENTANYL CITRATE (PF) 100 MCG/2ML IJ SOLN: 25.0000 ug | INTRAMUSCULAR | Status: DC | PRN

## 2024-01-21 MED ORDER — DEXMEDETOMIDINE HCL IN NACL 80 MCG/20ML IV SOLN
INTRAVENOUS | Status: AC
  Filled 2024-01-21: qty 20

## 2024-01-21 MED ORDER — DEXMEDETOMIDINE HCL IN NACL 80 MCG/20ML IV SOLN
INTRAVENOUS | Status: DC | PRN
  Administered 2024-01-21 (×2): 8 ug via INTRAVENOUS

## 2024-01-21 MED ORDER — ONDANSETRON HCL 4 MG/2ML IJ SOLN
INTRAMUSCULAR | Status: DC | PRN
  Administered 2024-01-21: 4 mg via INTRAVENOUS

## 2024-01-21 MED ORDER — OXYCODONE HCL 5 MG PO TABS: 5.0000 mg | ORAL_TABLET | Freq: Once | ORAL | Status: DC | PRN

## 2024-01-21 MED ORDER — SODIUM CHLORIDE 0.9 % IV SOLN
INTRAVENOUS | Status: DC | PRN
Start: 2024-01-21 — End: 2024-01-21

## 2024-01-21 MED ORDER — FENTANYL CITRATE (PF) 100 MCG/2ML IJ SOLN
INTRAMUSCULAR | Status: AC
  Filled 2024-01-21: qty 2

## 2024-01-21 MED ORDER — PROPOFOL 10 MG/ML IV BOLUS
INTRAVENOUS | Status: DC | PRN
  Administered 2024-01-21: 150 mg via INTRAVENOUS
  Administered 2024-01-21: 30 mg via INTRAVENOUS

## 2024-01-21 MED ORDER — EPHEDRINE SULFATE-NACL 50-0.9 MG/10ML-% IV SOSY
PREFILLED_SYRINGE | INTRAVENOUS | Status: DC | PRN
  Administered 2024-01-21 (×2): 10 mg via INTRAVENOUS

## 2024-01-21 MED ORDER — LIDOCAINE HCL (PF) 1 % IJ SOLN
INTRAMUSCULAR | Status: DC | PRN
  Administered 2024-01-21: 5 mL

## 2024-01-21 MED ORDER — LIDOCAINE HCL (CARDIAC) PF 100 MG/5ML IV SOSY
PREFILLED_SYRINGE | INTRAVENOUS | Status: DC | PRN
  Administered 2024-01-21: 100 mg via INTRAVENOUS

## 2024-01-21 MED ORDER — FENTANYL CITRATE (PF) 100 MCG/2ML IJ SOLN
INTRAMUSCULAR | Status: DC | PRN
  Administered 2024-01-21 (×2): 25 ug via INTRAVENOUS
  Administered 2024-01-21: 50 ug via INTRAVENOUS

## 2024-01-21 MED ORDER — PROPOFOL 1000 MG/100ML IV EMUL
INTRAVENOUS | Status: AC
  Filled 2024-01-21: qty 100

## 2024-01-21 MED ORDER — LIDOCAINE HCL (PF) 2 % IJ SOLN
INTRAMUSCULAR | Status: AC
  Filled 2024-01-21: qty 5

## 2024-01-21 MED ORDER — BUPIVACAINE HCL 0.5 % IJ SOLN
INTRAMUSCULAR | Status: DC | PRN
  Administered 2024-01-21: 5 mL

## 2024-01-21 MED ORDER — LACTULOSE 10 GM/15ML PO SOLN
30.0000 g | Freq: Every day | ORAL | Status: DC | PRN
  Administered 2024-01-21: 30 g via ORAL
  Filled 2024-01-21: qty 60

## 2024-01-21 MED ORDER — PHENYLEPHRINE 80 MCG/ML (10ML) SYRINGE FOR IV PUSH (FOR BLOOD PRESSURE SUPPORT)
PREFILLED_SYRINGE | INTRAVENOUS | Status: DC | PRN
  Administered 2024-01-21: 160 ug via INTRAVENOUS

## 2024-01-21 SURGICAL SUPPLY — 67 items
BLADE OSC/SAGITTAL MD 5.5X18 (BLADE) ×1 IMPLANT
BLADE SURG 15 STRL LF DISP TIS (BLADE) ×1 IMPLANT
BLADE SURG MINI STRL (BLADE) ×1 IMPLANT
BLADE SW THK.38XMED LNG THN (BLADE) IMPLANT
BNDG COHESIVE 4X5 TAN STRL LF (GAUZE/BANDAGES/DRESSINGS) ×1 IMPLANT
BNDG COHESIVE 6X5 TAN ST LF (GAUZE/BANDAGES/DRESSINGS) ×1 IMPLANT
BNDG ELASTIC 4INX 5YD STR LF (GAUZE/BANDAGES/DRESSINGS) ×1 IMPLANT
BNDG ELASTIC 4X5.8 VLCR NS LF (GAUZE/BANDAGES/DRESSINGS) ×1 IMPLANT
BNDG ESMARCH 4X12 STRL LF (GAUZE/BANDAGES/DRESSINGS) ×1 IMPLANT
BNDG GAUZE DERMACEA FLUFF 4 (GAUZE/BANDAGES/DRESSINGS) ×1 IMPLANT
BNDG STRETCH 4X75 STRL LF (GAUZE/BANDAGES/DRESSINGS) IMPLANT
BNDG STRETCH GAUZE 3IN X12FT (GAUZE/BANDAGES/DRESSINGS) ×2 IMPLANT
CANISTER WOUND CARE 500ML ATS (WOUND CARE) ×1 IMPLANT
CUFF TOURN SGL QUICK 12 (TOURNIQUET CUFF) IMPLANT
CUFF TOURN SGL QUICK 18X4 (TOURNIQUET CUFF) IMPLANT
DRAPE FLUOR MINI C-ARM 54X84 (DRAPES) ×1 IMPLANT
DRAPE XRAY CASSETTE 23X24 (DRAPES) ×1 IMPLANT
DRSG MEPILEX FLEX 3X3 (GAUZE/BANDAGES/DRESSINGS) IMPLANT
DRSG XEROFORM 1X8 (GAUZE/BANDAGES/DRESSINGS) IMPLANT
DURAPREP 26ML APPLICATOR (WOUND CARE) ×1 IMPLANT
ELECT REM PT RETURN 9FT ADLT (ELECTROSURGICAL) ×1 IMPLANT
ELECTRODE REM PT RTRN 9FT ADLT (ELECTROSURGICAL) ×1 IMPLANT
GAUZE PACKING 0.25INX5YD STRL (GAUZE/BANDAGES/DRESSINGS) ×1 IMPLANT
GAUZE PACKING IODOFORM 1/2INX (GAUZE/BANDAGES/DRESSINGS) ×1 IMPLANT
GAUZE SPONGE 4X4 12PLY STRL (GAUZE/BANDAGES/DRESSINGS) ×1 IMPLANT
GAUZE STRETCH 2X75IN STRL (MISCELLANEOUS) ×1 IMPLANT
GAUZE XEROFORM 1X8 LF (GAUZE/BANDAGES/DRESSINGS) ×1 IMPLANT
GLOVE BIO SURGEON STRL SZ7.5 (GLOVE) ×1 IMPLANT
GLOVE INDICATOR 8.0 STRL GRN (GLOVE) ×1 IMPLANT
GOWN STRL REUS W/ TWL LRG LVL3 (GOWN DISPOSABLE) ×1 IMPLANT
GOWN STRL REUS W/ TWL XL LVL3 (GOWN DISPOSABLE) ×1 IMPLANT
GOWN STRL REUS W/TWL MED LVL3 (GOWN DISPOSABLE) ×1 IMPLANT
GOWN STRL REUS W/TWL XL LVL4 (GOWN DISPOSABLE) ×1 IMPLANT
HANDPIECE VERSAJET DEBRIDEMENT (MISCELLANEOUS) IMPLANT
IV NS 1000ML BAXH (IV SOLUTION) ×1 IMPLANT
KIT DRSG VAC SLVR GRANUFM (MISCELLANEOUS) ×1 IMPLANT
KIT TURNOVER KIT A (KITS) ×1 IMPLANT
LABEL OR SOLS (LABEL) ×1 IMPLANT
MANIFOLD NEPTUNE II (INSTRUMENTS) ×1 IMPLANT
NDL FILTER BLUNT 18X1 1/2 (NEEDLE) ×1 IMPLANT
NDL HYPO 25X1 1.5 SAFETY (NEEDLE) ×1 IMPLANT
NEEDLE FILTER BLUNT 18X1 1/2 (NEEDLE) ×1 IMPLANT
NEEDLE HYPO 25X1 1.5 SAFETY (NEEDLE) ×1 IMPLANT
NS IRRIG 500ML POUR BTL (IV SOLUTION) ×1 IMPLANT
PACK EXTREMITY ARMC (MISCELLANEOUS) ×1 IMPLANT
PACKING GAUZE IODOFORM 1INX5YD (GAUZE/BANDAGES/DRESSINGS) ×1 IMPLANT
PAD ABD DERMACEA PRESS 5X9 (GAUZE/BANDAGES/DRESSINGS) ×2 IMPLANT
PULSAVAC PLUS IRRIG FAN TIP (DISPOSABLE) ×1 IMPLANT
RASP SM TEAR CROSS CUT (RASP) IMPLANT
SHIELD FULL FACE ANTIFOG 7M (MISCELLANEOUS) ×1 IMPLANT
SOL .9 NS 3000ML IRR UROMATIC (IV SOLUTION) ×1 IMPLANT
STOCKINETTE IMPERVIOUS 9X36 MD (GAUZE/BANDAGES/DRESSINGS) ×1 IMPLANT
STOCKINETTE M/LG 89821 (MISCELLANEOUS) ×1 IMPLANT
STRAP SAFETY 5IN WIDE (MISCELLANEOUS) ×1 IMPLANT
SUT ETHILON 2 0 FS 18 (SUTURE) ×2 IMPLANT
SUT ETHILON 3-0 FS-10 30 BLK (SUTURE) ×1 IMPLANT
SUT ETHILON 4-0 FS2 18XMFL BLK (SUTURE) IMPLANT
SUT ETHILON 5-0 FS-2 18 BLK (SUTURE) ×1 IMPLANT
SUT VIC AB 3-0 SH 27X BRD (SUTURE) ×1 IMPLANT
SUTURE EHLN 3-0 FS-10 30 BLK (SUTURE) ×1 IMPLANT
SUTURE ETHLN 4-0 FS2 18XMF BLK (SUTURE) ×1 IMPLANT
SWAB CULTURE AMIES ANAERIB BLU (MISCELLANEOUS) IMPLANT
SYR 10ML LL (SYRINGE) ×3 IMPLANT
SYR 3ML LL SCALE MARK (SYRINGE) ×1 IMPLANT
TIP FAN IRRIG PULSAVAC PLUS (DISPOSABLE) ×1 IMPLANT
TRAP FLUID SMOKE EVACUATOR (MISCELLANEOUS) ×1 IMPLANT
WATER STERILE IRR 500ML POUR (IV SOLUTION) ×1 IMPLANT

## 2024-01-21 NOTE — Discharge Instructions (Signed)

## 2024-01-21 NOTE — Anesthesia Procedure Notes (Signed)
 Procedure Name: Intubation Date/Time: 01/21/2024 9:07 AM  Performed by: Stormy Fabian, CRNAPre-anesthesia Checklist: Patient identified, Patient being monitored, Timeout performed, Emergency Drugs available and Suction available Patient Re-evaluated:Patient Re-evaluated prior to induction Oxygen Delivery Method: Circle system utilized Preoxygenation: Pre-oxygenation with 100% oxygen Induction Type: IV induction Ventilation: Mask ventilation without difficulty Laryngoscope Size: Mac and 3 Grade View: Grade I Tube type: Oral Tube size: 7.0 mm Number of attempts: 1 Airway Equipment and Method: Stylet Placement Confirmation: ETT inserted through vocal cords under direct vision, positive ETCO2 and breath sounds checked- equal and bilateral Secured at: 21 cm Tube secured with: Tape Dental Injury: Teeth and Oropharynx as per pre-operative assessment

## 2024-01-21 NOTE — Anesthesia Preprocedure Evaluation (Addendum)
 Anesthesia Evaluation  Patient identified by MRN, date of birth, ID band Patient awake    Reviewed: Allergy & Precautions, NPO status , Patient's Chart, lab work & pertinent test results  History of Anesthesia Complications Negative for: history of anesthetic complications  Airway Mallampati: III  TM Distance: >3 FB Neck ROM: full    Dental  (+) Chipped, Poor Dentition   Pulmonary neg pulmonary ROS   Pulmonary exam normal        Cardiovascular hypertension, (-) angina + CAD, + Past MI, + CABG, + Peripheral Vascular Disease and +CHF  (-) DOE Normal cardiovascular exam  Echo 08/2023  IMPRESSIONS     1. Left ventricular ejection fraction, by estimation, is 20 to 25%. The  left ventricle has severely decreased function. The left ventricle  demonstrates global hypokinesis. Left ventricular diastolic parameters are  consistent with Grade II diastolic  dysfunction (pseudonormalization).   2. Right ventricular systolic function is moderately reduced. The right  ventricular size is mildly enlarged.   3. Left atrial size was mildly dilated.   4. The mitral valve is normal in structure. Mild mitral valve  regurgitation.   5. The aortic valve is normal in structure. Aortic valve regurgitation is  trivial. Aortic valve sclerosis is present, with no evidence of aortic  valve stenosis.     Neuro/Psych  Neuromuscular disease CVA  negative psych ROS   GI/Hepatic Neg liver ROS,GERD  ,,  Endo/Other  diabetes, Poorly Controlled, Type 2    Renal/GU      Musculoskeletal   Abdominal   Peds  Hematology negative hematology ROS (+)   Anesthesia Other Findings Past Medical History: No date: Acute cholecystitis No date: Anginal pain (HCC) No date: CHF (congestive heart failure) (HCC) No date: Chronic lower back pain No date: Coronary artery disease No date: DM (diabetes mellitus), type 2 (HCC) No date: Elevated LFTs No date:  Facial cellulitis No date: Frequent falls No date: GERD (gastroesophageal reflux disease) No date: History of concussion No date: History of kidney stones No date: Hypertension No date: Ischemic cardiomyopathy No date: Metabolic acidosis No date: 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)     Comment:  mini, tingling right hand  Past Surgical History: 08/09/2016: CARDIAC CATHETERIZATION; Left     Comment:  Procedure: Left Heart Cath and Coronary Angiography;                Surgeon: Dalia Heading, MD;  Location: [facility] INVASIVE CV               LAB;  Service: Cardiovascular;  Laterality: Left; 09/28/2023: CAROTID PTA/STENT INTERVENTION; Left     Comment:  Procedure: CAROTID PTA/STENT INTERVENTION;  Surgeon:               Renford Dills, MD;  Location: [facility] INVASIVE CV LAB;               Service: Cardiovascular;  Laterality: Left; 02/06/2023: CATARACT EXTRACTION W/PHACO; Right     Comment:  Procedure: CATARACT EXTRACTION PHACO AND INTRAOCULAR               LENS PLACEMENT (IOC) AHMED TUBE SHUNT W/ TUTOPLAST RIGHT               DIABETIC  3.94   00:42.6;  Surgeon: Nevada Crane,               MD;  Location: [facility];  Service:               Ophthalmology;  Laterality: Right; 02/02/2021: COLONOSCOPY WITH PROPOFOL; N/A     Comment:  Procedure: COLONOSCOPY WITH PROPOFOL;  Surgeon:               Regis Bill, MD;  Location: [facility] ENDOSCOPY;                Service: Endoscopy;  Laterality: N/A; 2017: CORONARY ARTERY BYPASS GRAFT     Comment:  x5 12/11/2023: IR CHOLANGIOGRAM EXISTING TUBE 09/01/2023: IR EXCHANGE BILIARY DRAIN 11/17/2023: IR EXCHANGE BILIARY DRAIN 07/28/2023: IR PERC CHOLECYSTOSTOMY 11/17/2023: IR RADIOLOGIST EVAL  & MGMT  BMI    Body Mass Index: 30.15 kg/m      Reproductive/Obstetrics negative OB ROS                             Anesthesia Physical Anesthesia Plan  ASA: 3  Anesthesia Plan: General LMA   Post-op Pain Management: Ofirmev IV (intra-op)* and Toradol IV (intra-op)*   Induction: Intravenous  PONV Risk Score and Plan: Dexamethasone, Ondansetron and Treatment may vary due to age or medical condition  Airway Management Planned: Oral ETT  Additional Equipment:   Intra-op Plan:   Post-operative Plan: Extubation in OR  Informed Consent: I have reviewed the patients History and Physical, chart, labs and discussed the procedure including the risks, benefits and alternatives for the proposed anesthesia with the patient or authorized representative who has indicated his/her understanding and acceptance.     Dental Advisory Given  Plan Discussed with: Anesthesiologist, CRNA and Surgeon  Anesthesia Plan Comments: (Patient consented for risks of anesthesia including but not limited to:  - adverse reactions to medications - damage to eyes, teeth, lips or other oral mucosa - nerve damage due to positioning  - sore throat or hoarseness - Damage to heart, brain, nerves, lungs, other parts of body or loss of life  Patient voiced understanding and assent.)        Anesthesia Quick Evaluation

## 2024-01-21 NOTE — Op Note (Signed)
 Operative note   Surgeon:Balinda Heacock [employer] forces logistics/support/administrative officer: None    Preop diagnosis: 1.  Right second toe gangrene 2.  Necrotic ulcer distal medial right great toe    Postop diagnosis: Same    Procedure: 1.  Amputation right second toe MTPJ 2.  Debridement to fascia and muscle medial distal right great toe    EBL: Minimal    Anesthesia:local and general.  Local consists of a total of 10 cc of one-to-one mixture of 0.5% bupivacaine and 1% lidocaine plain    Hemostasis: None    Specimen: Gangrene of second toe right foot and deep wound culture    Complications: None    Operative indications:Alexander [name] is an 73 y.o. that presents today for surgical intervention.  The risks/benefits/alternatives/complications have been discussed and consent has been given.    Procedure:  Patient was brought into the OR and placed on the operating table in thesupine position. After anesthesia was obtained theright lower extremity was prepped and draped in usual sterile fashion.  This time 2 semielliptical incisions were made at the base of the second toe.  Full-thickness flaps were created.  The toe was disarticulated at the MTPJ.  The wound was then flushed with copious amounts of irrigation.  A deep wound culture had been performed.  The toe was sent for pathological examination.  Closure of the wound was then performed with a 3-0 nylon.  Attention was directed to the distal medial right great toe.  The necrotic ulcer was noted.  Predebridement measurement 1.3 mm in diameter.  Postdebridement measurement 1.5 mm in diameter.  Debridement was taken down to the level and including the fascia and muscle.  Debridement performed with 15 blade and Versajet.  Good bleeding was noted.  A sterile bandage was applied to the area.    Patient tolerated the procedure and anesthesia well.  Was transported from the OR to the PACU with all vital signs stable and vascular status intact. To be discharged per routine protocol.   Will follow up in approximately 1 week in the outpatient clinic.

## 2024-01-21 NOTE — Progress Notes (Signed)
 Progress Note    Alexander [name]  QIH:[id number] DOB: 01-27-1951  DOA: 01/17/2024 PCP: Myrene Buddy, NP      Brief Narrative:    Medical records reviewed and are as summarized below:  Alexander [name] is a 73 y.o. 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  He said he noticed black areas on the right first and second toes several weeks ago, about a month prior to admission.  He then developed pain and redness in the right proximal foot about 2 weeks prior to admission.  Pain was slowly getting worse so he went to see his PCP on 01/15/2024.  He was prescribed Augmentin but he had not started it yet.  He was referred to see a podiatrist.  He saw Dr. Ether Griffins, podiatrist, on 01/16/2024.  He was then referred to see a vascular surgeon but the vascular surgeon referred him to the ED because of concern for critical limb ischemia.      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  (Class I obesity)   Chronic critical limb ischemia, gangrenous right first and second toes, cellulitis right foot: S/p percutaneous transluminal angioplasty right popliteal artery, percutaneous laser atherectomy with transluminal angioplasty of right anterior tibial artery on 01/19/2024. S/p amputation of second toe and debridement of right foot ulcer on 01/21/2024 Continue IV Zosyn and vancomycin. Continue aspirin, statin and Plavix Follow-up with podiatrist   Lactic acidosis:  Probably due to ischemic limb.   CAD s/p CABG, history of stroke: Continue aspirin, Plavix and statin   Chronic HFrEF, ischemic cardiomyopathy: Compensated. Continue carvedilol and Entresto. 2D echo in November 2024 showed EF estimated at 20 to 25%, grade 2 diastolic dysfunction.   Type II DM: NovoLog as needed for hyperglycemia   Comorbidities include hypertension, hyperlipidemia, history of acute cholecystitis s/p cholecystostomy tube from 07/28/2023 through 12/11/2023, elevated liver enzymes      Diet Order             Diet heart healthy/carb modified Room service appropriate? Yes; Fluid consistency: Thin  Diet effective now                            Consultants: Vascular surgeon Podiatrist  Procedures: S/p percutaneous transluminal angioplasty right popliteal artery, percutaneous laser atherectomy with transluminal angioplasty of right anterior tibial artery on 01/19/2024. S/p amputation of second toe and debridement of right foot ulcer on 01/21/2024    Medications:    aspirin EC  81 mg Oral Daily   atorvastatin  80 mg Oral Daily   carvedilol  3.125 mg Oral BID WC   clopidogrel  75 mg Oral Q breakfast   insulin aspart  0-15 Units Subcutaneous TID WC   insulin aspart  0-5 Units Subcutaneous QHS   insulin glargine-yfgn  20 Units Subcutaneous Daily   sodium chloride flush  3 mL Intravenous Q12H   Continuous Infusions:  piperacillin-tazobactam (ZOSYN)  IV 3.375 g (01/21/24 0302)  vancomycin Stopped (01/20/24 2329)     Anti-infectives (From admission, onward)    Start     Dose/Rate Route Frequency Ordered Stop   01/19/24 1030  ceFAZolin (ANCEF) IVPB 2g/100 mL premix  Status:  Discontinued        2 g 200 mL/hr over 30 Minutes Intravenous 30 min pre-op 01/19/24 0754 01/19/24 0906   01/18/24 1000  vancomycin (VANCOREADY) IVPB 1500 mg/300 mL        1,500 mg 150 mL/hr over 120 Minutes Intravenous Every 12 hours 01/18/24 0019     01/18/24 0400   piperacillin-tazobactam (ZOSYN) IVPB 3.375 g        3.375 g 12.5 mL/hr over 240 Minutes Intravenous Every 8 hours 01/18/24 0018     01/18/24 0015  vancomycin (VANCOREADY) IVPB 1500 mg/300 mL        1,500 mg 150 mL/hr over 120 Minutes Intravenous  Once 01/18/24 0011 01/18/24 0300   01/17/24 2130  vancomycin (VANCOCIN) IVPB 1000 mg/200 mL premix        1,000 mg 200 mL/hr over 60 Minutes Intravenous  Once 01/17/24 2122 01/17/24 2310   01/17/24 2130  piperacillin-tazobactam (ZOSYN) IVPB 3.375 g        3.375 g 100 mL/hr over 30 Minutes Intravenous  Once 01/17/24 2122 01/17/24 2208              Family Communication/Anticipated D/C date and plan/Code Status   DVT prophylaxis:      Code Status: Full Code  Family Communication: Plan discussed with his daughter at the bedside Disposition Plan: Plan to discharge home   Status is: Inpatient Remains inpatient appropriate because: Critical limb ischemia       Subjective:   Interval events noted.  He had foot surgery this morning.  He has no complaints.  No pain.  His daughter was at the bedside.   Objective:    Vitals:   01/21/24 1000 01/21/24 1015 01/21/24 1030 01/21/24 1055  BP: (!) 93/55 (!) 109/58 (!) 106/59 101/74  Pulse: 78 75 72 66  Resp: 17 12 12    Temp:  98.5 F (36.9 C)    TempSrc:      SpO2: 98% 96% 99% 96%  Weight:      Height:       No data found.   Intake/Output Summary (Last 24 hours) at 01/21/2024 1140 Last data filed at 01/21/2024 0939 Gross per 24 hour  Intake 1365.34 ml  Output --  Net 1365.34 ml    Filed Weights   01/17/24 1734 01/19/24 0401  Weight: 102.1 kg 100.8 kg    Exam:  GEN: NAD SKIN: Warm and dry EYES: No pallor or icterus ENT: MMM CV: RRR PULM: CTA B ABD: soft, obese, NT, +BS CNS: AAO x 3, non focal EXT: Dressing on right foot is clean, dry and intact      Data Reviewed:   I have personally reviewed following labs and imaging studies:  Labs: Labs show the  following:   Basic Metabolic Panel: Recent Labs  Lab 01/17/24 1735 01/18/24 1404 01/19/24 0454 01/21/24 0404  NA 137  --  137 136  K 4.7  --  3.5 3.5  CL 107  --  106 108  CO2 22  --  23 21*  GLUCOSE 197*  --  186* 203*  BUN 17  --  13 17  CREATININE 0.66 0.56* 0.66 0.79  CALCIUM 9.0  --  8.6* 8.5*   GFR Estimated Creatinine Clearance: 104.1 mL/min (  by C-G formula based on SCr of 0.79 mg/dL). Liver Function Tests: Recent Labs  Lab 01/17/24 1735 01/19/24 0454  AST 72* 40  ALT 297* 179*  ALKPHOS 230* 188*  BILITOT 1.5* 1.2  PROT 7.1 6.3*  ALBUMIN 3.5 3.1*   No results for input(s): "LIPASE", "AMYLASE" in the last 168 hours. No results for input(s): "AMMONIA" in the last 168 hours. Coagulation profile Recent Labs  Lab 01/17/24 2130  INR 1.0    CBC: Recent Labs  Lab 01/17/24 1735 01/18/24 0604 01/19/24 0454 01/20/24 0515  WBC 6.3 5.8 6.0 6.5  NEUTROABS 3.9  --   --   --   HGB 14.5 13.5 13.4 12.7*  HCT 42.7 38.8* 38.7* 36.9*  MCV 93.0 90.7 89.0 90.2  PLT 186 186 205 207   Cardiac Enzymes: No results for input(s): "CKTOTAL", "CKMB", "CKMBINDEX", "TROPONINI" in the last 168 hours. BNP (last 3 results) No results for input(s): "PROBNP" in the last 8760 hours. CBG: Recent Labs  Lab 01/20/24 1212 01/20/24 1623 01/20/24 2105 01/21/24 0757 01/21/24 1009  GLUCAP 178* 126* 153* 167* 168*   D-Dimer: No results for input(s): "DDIMER" in the last 72 hours. Hgb A1c: No results for input(s): "HGBA1C" in the last 72 hours. Lipid Profile: No results for input(s): "CHOL", "HDL", "LDLCALC", "TRIG", "CHOLHDL", "LDLDIRECT" in the last 72 hours. Thyroid function studies: No results for input(s): "TSH", "T4TOTAL", "T3FREE", "THYROIDAB" in the last 72 hours.  Invalid input(s): "FREET3" Anemia work up: No results for input(s): "VITAMINB12", "FOLATE", "FERRITIN", "TIBC", "IRON", "RETICCTPCT" in the last 72 hours. Sepsis Labs: Recent Labs  Lab 01/17/24 1735  01/17/24 1736 01/18/24 0604 01/19/24 0454 01/20/24 0515  WBC 6.3  --  5.8 6.0 6.5  LATICACIDVEN  --  2.5*  --   --   --     Microbiology Recent Results (from the past 240 hours)  Blood culture (single)     Status: None (Preliminary result)   Collection Time: 01/17/24  5:37 PM   Specimen: BLOOD  Result Value Ref Range Status   Specimen Description BLOOD BLOOD LEFT HAND  Final   Special Requests   Final    BOTTLES DRAWN AEROBIC AND ANAEROBIC Blood Culture adequate volume   Culture   Final    NO GROWTH 4 DAYS Performed at [facility], [address], [place], [place] [address]    Report Status PENDING  Incomplete  Urine Culture     Status: Abnormal   Collection Time: 01/18/24  3:39 AM   Specimen: Urine, Random  Result Value Ref Range Status   Specimen Description   Final    URINE, RANDOM Performed at [facility], [address] [place], [place] [address]    Special Requests   Final    NONE Reflexed from [id number] Performed at [facility], [address] [place], [place] [address]    Culture (A)  Final    <10,000 COLONIES/mL INSIGNIFICANT GROWTH Performed at [facility], [address], [place], [place] [address]    Report Status 01/19/2024 FINAL  Final    Procedures and diagnostic studies:  No results found.              LOS: 4 days   Steffie Waggoner  [facility]   Pager on [email or web address]. If 7PM-7AM, please contact night-coverage at [email or web address]     01/21/2024, 11:40 AM

## 2024-01-21 NOTE — Progress Notes (Signed)
 Patient's status post second toe amputation and debridement of ulcer right foot.  Adequate perfusion was noted intraoperatively.  From podiatry standpoint patient is stable for discharge.  Dressing can be left clean dry and intact.  Change only if needed for saturated strikethrough bleeding.  Can place home p.o. antibiotics upon discharge.  Would recommend doxycycline for 5 days.  Weight-bear as tolerated in the postoperative shoe.  Should minimize weightbearing.  Instructed patient to follow-up in podiatry in 1 week for reevaluation.

## 2024-01-21 NOTE — Progress Notes (Signed)
 Patient off the floor at this time for surgery procedure.

## 2024-01-21 NOTE — Anesthesia Postprocedure Evaluation (Signed)
 Anesthesia Post Note  Patient: Alexander [name]  Procedure(s) Performed: AMPUTATION, TOE (Right: Toe) IRRIGATION AND DEBRIDEMENT FOOT (Right: Toe)  Patient location during evaluation: PACU Anesthesia Type: General Level of consciousness: awake and alert Pain management: pain level controlled Vital Signs Assessment: post-procedure vital signs reviewed and stable Respiratory status: spontaneous breathing, nonlabored ventilation, respiratory function stable and patient connected to nasal cannula oxygen Cardiovascular status: blood pressure returned to baseline and stable Postop Assessment: no apparent nausea or vomiting Anesthetic complications: no   No notable events documented.   Last Vitals:  Vitals:   01/21/24 1030 01/21/24 1055  BP: (!) 106/59 101/74  Pulse: 72 66  Resp: 12   Temp:    SpO2: 99% 96%    Last Pain:  Vitals:   01/21/24 1015  TempSrc:   PainSc: 0-No pain                 Louie Boston

## 2024-01-21 NOTE — Transfer of Care (Signed)
 Immediate Anesthesia Transfer of Care Note  Patient: Alexander [name]  Procedure(s) Performed: Procedure(s): AMPUTATION, TOE (Right) IRRIGATION AND DEBRIDEMENT FOOT (Right)  Patient Location: PACU  Anesthesia Type:General  Level of Consciousness: sedated  Airway & Oxygen Therapy: Patient Spontanous Breathing and Patient connected to face mask oxygen  Post-op Assessment: Report given to RN and Post -op Vital signs reviewed and stable  Post vital signs: Reviewed and stable  Last Vitals:  Vitals:   01/21/24 0800 01/21/24 0942  BP: 113/62 111/64  Pulse: (!) 57 82  Resp: 19 14  Temp: 36.7 C   SpO2: 100% 100%    Complications: No apparent anesthesia complications

## 2024-01-22 ENCOUNTER — Encounter: Payer: Self-pay | Admitting: Podiatry

## 2024-01-22 DIAGNOSIS — I70235 Atherosclerosis of native arteries of right leg with ulceration of other part of foot: Secondary | ICD-10-CM | POA: Diagnosis not present

## 2024-01-22 LAB — CBC
HCT: 38 % — ABNORMAL LOW (ref 39.0–52.0)
Hemoglobin: 13 g/dL (ref 13.0–17.0)
MCH: 31.3 pg (ref 26.0–34.0)
MCHC: 34.2 g/dL (ref 30.0–36.0)
MCV: 91.3 fL (ref 80.0–100.0)
Platelets: 237 10*3/uL (ref 150–400)
RBC: 4.16 MIL/uL — ABNORMAL LOW (ref 4.22–5.81)
RDW: 14.1 % (ref 11.5–15.5)
WBC: 9.8 10*3/uL (ref 4.0–10.5)
nRBC: 0 % (ref 0.0–0.2)

## 2024-01-22 LAB — COMPREHENSIVE METABOLIC PANEL WITH GFR
ALT: 119 U/L — ABNORMAL HIGH (ref 0–44)
AST: 34 U/L (ref 15–41)
Albumin: 3.3 g/dL — ABNORMAL LOW (ref 3.5–5.0)
Alkaline Phosphatase: 147 U/L — ABNORMAL HIGH (ref 38–126)
Anion gap: 10 (ref 5–15)
BUN: 18 mg/dL (ref 8–23)
CO2: 20 mmol/L — ABNORMAL LOW (ref 22–32)
Calcium: 8.9 mg/dL (ref 8.9–10.3)
Chloride: 105 mmol/L (ref 98–111)
Creatinine, Ser: 0.66 mg/dL (ref 0.61–1.24)
GFR, Estimated: >60 mL/min (ref >60)
Glucose, Bld: 216 mg/dL — ABNORMAL HIGH (ref 70–99)
Potassium: 4.1 mmol/L (ref 3.5–5.1)
Sodium: 135 mmol/L (ref 135–145)
Total Bilirubin: 1.1 mg/dL (ref 0.0–1.2)
Total Protein: 6.6 g/dL (ref 6.5–8.1)

## 2024-01-22 LAB — CULTURE, BLOOD (SINGLE)
Culture: NO GROWTH
Special Requests: ADEQUATE

## 2024-01-22 LAB — LACTIC ACID, PLASMA: Lactic Acid, Venous: 1.2 mmol/L (ref 0.5–1.9)

## 2024-01-22 LAB — VANCOMYCIN, PEAK: Vancomycin Pk: 24 ug/mL — ABNORMAL LOW (ref 30–40)

## 2024-01-22 LAB — GLUCOSE, CAPILLARY: Glucose-Capillary: 198 mg/dL — ABNORMAL HIGH (ref 70–99)

## 2024-01-22 MED ORDER — AMOXICILLIN-POT CLAVULANATE 875-125 MG PO TABS: 1.0000 | ORAL_TABLET | Freq: Two times a day (BID) | ORAL | Status: AC

## 2024-01-22 NOTE — Plan of Care (Signed)
  Problem: Education: Goal: Knowledge of General Education information will improve Description: Including pain rating scale, medication(s)/side effects and non-pharmacologic comfort measures Outcome: Progressing   Problem: Activity: Goal: Risk for activity intolerance will decrease Outcome: Progressing   Problem: Safety: Goal: Ability to remain free from injury will improve Outcome: Progressing   Problem: Activity: Goal: Ability to return to baseline activity level will improve Outcome: Progressing   Problem: Health Behavior/Discharge Planning: Goal: Ability to safely manage health-related needs after discharge will improve Outcome: Progressing

## 2024-01-22 NOTE — Plan of Care (Signed)
  Problem: Education: Goal: Ability to describe self-care measures that may prevent or decrease complications (Diabetes Survival Skills Education) will improve Outcome: Adequate for Discharge Goal: Individualized Educational Video(s) Outcome: Adequate for Discharge   Problem: Coping: Goal: Ability to adjust to condition or change in health will improve Outcome: Adequate for Discharge   Problem: Fluid Volume: Goal: Ability to maintain a balanced intake and output will improve Outcome: Adequate for Discharge   Problem: Health Behavior/Discharge Planning: Goal: Ability to identify and utilize available resources and services will improve Outcome: Adequate for Discharge Goal: Ability to manage health-related needs will improve Outcome: Adequate for Discharge   Problem: Metabolic: Goal: Ability to maintain appropriate glucose levels will improve Outcome: Adequate for Discharge   Problem: Nutritional: Goal: Maintenance of adequate nutrition will improve Outcome: Adequate for Discharge Goal: Progress toward achieving an optimal weight will improve Outcome: Adequate for Discharge   Problem: Skin Integrity: Goal: Risk for impaired skin integrity will decrease Outcome: Adequate for Discharge   Problem: Tissue Perfusion: Goal: Adequacy of tissue perfusion will improve Outcome: Adequate for Discharge   Problem: Clinical Measurements: Goal: Ability to avoid or minimize complications of infection will improve Outcome: Adequate for Discharge   Problem: Skin Integrity: Goal: Skin integrity will improve Outcome: Adequate for Discharge   Problem: Education: Goal: Knowledge of General Education information will improve Description: Including pain rating scale, medication(s)/side effects and non-pharmacologic comfort measures Outcome: Adequate for Discharge   Problem: Health Behavior/Discharge Planning: Goal: Ability to manage health-related needs will improve Outcome: Adequate for  Discharge   Problem: Clinical Measurements: Goal: Ability to maintain clinical measurements within normal limits will improve Outcome: Adequate for Discharge Goal: Will remain free from infection Outcome: Adequate for Discharge Goal: Diagnostic test results will improve Outcome: Adequate for Discharge Goal: Respiratory complications will improve Outcome: Adequate for Discharge Goal: Cardiovascular complication will be avoided Outcome: Adequate for Discharge   Problem: Activity: Goal: Risk for activity intolerance will decrease Outcome: Adequate for Discharge   Problem: Nutrition: Goal: Adequate nutrition will be maintained Outcome: Adequate for Discharge   Problem: Coping: Goal: Level of anxiety will decrease Outcome: Adequate for Discharge   Problem: Elimination: Goal: Will not experience complications related to bowel motility Outcome: Adequate for Discharge Goal: Will not experience complications related to urinary retention Outcome: Adequate for Discharge   Problem: Pain Managment: Goal: General experience of comfort will improve and/or be controlled Outcome: Adequate for Discharge   Problem: Safety: Goal: Ability to remain free from injury will improve Outcome: Adequate for Discharge   Problem: Skin Integrity: Goal: Risk for impaired skin integrity will decrease Outcome: Adequate for Discharge   Problem: Education: Goal: Understanding of CV disease, CV risk reduction, and recovery process will improve Outcome: Adequate for Discharge Goal: Individualized Educational Video(s) Outcome: Adequate for Discharge   Problem: Activity: Goal: Ability to return to baseline activity level will improve Outcome: Adequate for Discharge   Problem: Cardiovascular: Goal: Ability to achieve and maintain adequate cardiovascular perfusion will improve Outcome: Adequate for Discharge Goal: Vascular access site(s) Level 0-1 will be maintained Outcome: Adequate for Discharge    Problem: Health Behavior/Discharge Planning: Goal: Ability to safely manage health-related needs after discharge will improve Outcome: Adequate for Discharge

## 2024-01-22 NOTE — Discharge Summary (Addendum)
 Physician Discharge Summary   Patient: Alexander [name] MRN: [id number] DOB: 01-30-1951  Admit date:     01/17/2024  Discharge date: 01/22/24  Discharge Physician: Lurene Shadow   PCP: Myrene Buddy, NP   Recommendations at discharge:   Follow-up with Dr. 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 *  Hospital Course:  Alexander [name] is a 73 y.o. 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  He said he noticed black areas on the right first and second toes several weeks ago, about a month prior to admission.  He then developed pain and redness in the right proximal foot about 2 weeks prior to admission.  Pain was slowly getting worse so he went to see his PCP on 01/15/2024.  He was prescribed Augmentin but he had not started it yet.  He was referred to see a podiatrist.  He saw Dr. Ether Griffins, podiatrist, on 01/16/2024.  He was then referred to see a vascular surgeon but the vascular surgeon referred him to the ED because of concern for critical limb ischemia.   Assessment and Plan:  Chronic critical limb ischemia, gangrenous right first and second toes, cellulitis right foot: S/p percutaneous transluminal angioplasty right popliteal artery, percutaneous laser atherectomy with transluminal angioplasty of right anterior tibial artery on 01/19/2024. S/p amputation of second toe and  debridement of right foot ulcer on 01/21/2024. No pain. He will be discharged on Augmentin for 5 more days (he already has Augmentin prescription at home that he has not started). S/p treatment with IV Zosyn and vancomycin for 5 days. Continue aspirin, Plavix and statin Patient is okay for discharge.  Discharge plan was discussed with Dr. Ether Griffins, podiatrist.    Lactic acidosis: Resolved     CAD s/p CABG, history of stroke: Continue aspirin, Plavix and statin     Chronic HFrEF, ischemic cardiomyopathy: Compensated. Continue carvedilol and Entresto. 2D echo in November 2024 showed EF estimated at 20 to 25%, grade 2 diastolic dysfunction.     Type II DM: Continue Lantus, Jardiance and Ozempic     Comorbidities include hypertension, hyperlipidemia, history of acute cholecystitis s/p cholecystostomy tube from 07/28/2023 through 12/11/2023, elevated liver enzymes     His condition has improved and he is deemed stable for discharge to home today.       Consultants: Podiatrist, vascular surgeon Procedures performed:  S/p percutaneous transluminal angioplasty right popliteal artery, percutaneous laser atherectomy with transluminal angioplasty of right anterior tibial artery on 01/19/2024. S/p amputation of second toe and debridement of right foot ulcer on 01/21/2024  Disposition: Home Diet recommendation:  Discharge Diet Orders (From admission, onward)     Start     Ordered   01/22/24 0000  Diet - low sodium heart healthy        01/22/24 0840           Cardiac and Carb modified diet DISCHARGE MEDICATION: Allergies as of 01/22/2024  No Known Allergies      Medication List     STOP taking these medications    carvedilol 3.125 MG tablet Commonly known as: COREG       TAKE these medications    acetaminophen 500 MG tablet Commonly known as: TYLENOL Take 1,000 mg by mouth every 6 (six) hours as needed for mild pain or headache.   amoxicillin-clavulanate 875-125 MG  tablet Commonly known as: AUGMENTIN Take 1 tablet by mouth 2 (two) times daily for 5 days.   aspirin EC 81 MG tablet Take 1 tablet (81 mg total) by mouth daily.   atorvastatin 80 MG tablet Commonly known as: LIPITOR Take 1 tablet (80 mg total) by mouth daily. Hold until you see your doctor and have your liver enzymes repeated   clopidogrel 75 MG tablet Commonly known as: PLAVIX Take 1 tablet (75 mg total) by mouth daily.   feeding supplement Liqd Take 237 mLs by mouth 2 (two) times daily between meals.   HYDROcodone-acetaminophen 5-325 MG tablet Commonly known as: NORCO/VICODIN Take 1-2 tablets by mouth every 6 (six) hours as needed for moderate pain (pain score 4-6) or severe pain (pain score 7-10).   Jardiance 25 MG Tabs tablet Generic drug: empagliflozin Take 25 mg by mouth daily.   Lantus SoloStar 100 UNIT/ML Solostar Pen Generic drug: insulin glargine Inject 38 Units into the skin at bedtime.   Ozempic (2 MG/DOSE) 8 MG/3ML Sopn Generic drug: Semaglutide (2 MG/DOSE) Inject 2 mg into the skin every 7 (seven) days. Sundays   sacubitril-valsartan 24-26 MG Commonly known as: ENTRESTO Take 1 tablet by mouth every 12 (twelve) hours.   spironolactone 25 MG tablet Commonly known as: ALDACTONE Take 12.5 mg by mouth daily.               Discharge Care Instructions  (From admission, onward)           Start     Ordered   01/22/24 0000  Discharge wound care:       Comments: Keep dressing clean and dry and follow-up with podiatrist for dressing change.   01/22/24 0840            Follow-up Information     Gwyneth Revels, DPM. Schedule an appointment as soon as possible for a visit in 1 week(s).   Specialty: Podiatry Why: Hospital Follow-Up appointment: April 4th @ 10:45  At the [facility] location.  [address], [place], [place] [address]  Please arrive 15 mintues early. Contact information: [address] [place] [address] [phone]                 Discharge Exam: Ceasar Mons Weights   01/17/24 1734 01/19/24 0401  Weight: 102.1 kg 100.8 kg   GEN: NAD SKIN: Warm and dry EYES: No pallor or icterus ENT: MMM CV: RRR PULM: CTA B ABD: soft, obese, NT, +BS CNS: AAO x 3, non focal EXT: Dressing on foot surgical wound is clean, dry and intact   Condition at discharge: good  The results of significant diagnostics from this hospitalization (including imaging, microbiology, ancillary and laboratory) are listed below for reference.   Imaging Studies: PERIPHERAL VASCULAR CATHETERIZATION Result Date: 01/19/2024 See surgical note for result.  DG Foot Complete Right Result Date: 01/17/2024 CLINICAL DATA:  Right-sided foot pain with wounds EXAM: RIGHT FOOT COMPLETE - 3+ VIEW COMPARISON:  None Available. FINDINGS: No fracture or malalignment. Vascular calcifications. Moderate plantar calcaneal spur. Degenerative changes at the first MTP joint. No  osseous erosive change. Questionable fracture deformity at the medial malleolus. IMPRESSION: 1. No acute osseous abnormality. 2. Questionable fracture deformity at the medial malleolus. Correlate with dedicated ankle radiographs. Electronically Signed   By: Jasmine Pang M.D.   On: 01/17/2024 21:50    Microbiology: Results for orders placed or performed during the hospital encounter of 01/17/24  Blood culture (single)     Status: None   Collection Time: 01/17/24  5:37 PM   Specimen: BLOOD  Result Value Ref Range Status   Specimen Description BLOOD BLOOD LEFT HAND  Final   Special Requests   Final    BOTTLES DRAWN AEROBIC AND ANAEROBIC Blood Culture adequate volume   Culture   Final    NO GROWTH 5 DAYS Performed at [facility], [address], [place], [place] [address]    Report Status 01/22/2024 FINAL  Final  Urine Culture     Status: Abnormal   Collection Time: 01/18/24  3:39 AM   Specimen: Urine, Random  Result Value Ref Range Status   Specimen Description   Final     URINE, RANDOM Performed at [facility], [address] [place], [place] [address]    Special Requests   Final    NONE Reflexed from [id number] Performed at [facility], [address] [place], [place] [address]    Culture (A)  Final    <10,000 COLONIES/mL INSIGNIFICANT GROWTH Performed at [facility], [address], [place], [place] [address]    Report Status 01/19/2024 FINAL  Final  Aerobic/Anaerobic Culture w Gram Stain (surgical/deep wound)     Status: None (Preliminary result)   Collection Time: 01/21/24  9:28 AM   Specimen: Toe, Right; Amputation  Result Value Ref Range Status   Specimen Description   Final    WOUND Performed at [facility], [address], [place], [place] [address]    Special Requests   Final    RIGHT TOE Performed at [facility], [address] [place], [place] [address]    Gram Stain RARE WBC SEEN NO ORGANISMS SEEN   Final   Culture   Final    NO GROWTH < 24 HOURS Performed at [facility], [address], [place], [place] [address]    Report Status PENDING  Incomplete    Labs: CBC: Recent Labs  Lab 01/17/24 1735 01/18/24 0604 01/19/24 0454 01/20/24 0515 01/22/24 0200  WBC 6.3 5.8 6.0 6.5 9.8  NEUTROABS 3.9  --   --   --   --   HGB 14.5 13.5 13.4 12.7* 13.0  HCT 42.7 38.8* 38.7* 36.9* 38.0*  MCV 93.0 90.7 89.0 90.2 91.3  PLT 186 186 205 207 237   Basic Metabolic Panel: Recent Labs  Lab 01/17/24 1735 01/18/24 1404 01/19/24 0454 01/21/24 0404 01/22/24 0200  NA 137  --  137 136 135  K 4.7  --  3.5 3.5 4.1  CL 107  --  106 108 105  CO2 22  --  23 21* 20*  GLUCOSE 197*  --  186* 203* 216*  BUN 17  --  13 17 18   CREATININE 0.66 0.56* 0.66 0.79 0.66  CALCIUM 9.0  --  8.6* 8.5* 8.9   Liver Function Tests: Recent Labs  Lab 01/17/24 1735 01/19/24 0454 01/22/24 0200  AST 72* 40 34  ALT 297* 179* 119*  ALKPHOS 230* 188* 147*  BILITOT 1.5* 1.2 1.1  PROT 7.1 6.3* 6.6   ALBUMIN  3.5 3.1* 3.3*   CBG: Recent Labs  Lab 01/21/24 1009 01/21/24 1146 01/21/24 1614 01/21/24 2030 01/22/24 0832  GLUCAP 168* 176* 250* 235* 198*    Discharge time spent: greater than 30 minutes.  Signed: Lurene Shadow, MD [facility] 01/22/2024

## 2024-01-23 LAB — SURGICAL PATHOLOGY

## 2024-01-26 LAB — AEROBIC/ANAEROBIC CULTURE W GRAM STAIN (SURGICAL/DEEP WOUND)

## 2024-02-14 ENCOUNTER — Other Ambulatory Visit (INDEPENDENT_AMBULATORY_CARE_PROVIDER_SITE_OTHER): Payer: Self-pay | Admitting: Vascular Surgery

## 2024-02-14 DIAGNOSIS — Z9889 Other specified postprocedural states: Secondary | ICD-10-CM

## 2024-02-15 ENCOUNTER — Encounter (INDEPENDENT_AMBULATORY_CARE_PROVIDER_SITE_OTHER): Payer: Self-pay | Admitting: Nurse Practitioner

## 2024-02-15 ENCOUNTER — Ambulatory Visit (INDEPENDENT_AMBULATORY_CARE_PROVIDER_SITE_OTHER): Admitting: Nurse Practitioner

## 2024-02-15 ENCOUNTER — Ambulatory Visit (INDEPENDENT_AMBULATORY_CARE_PROVIDER_SITE_OTHER)

## 2024-02-15 VITALS — BP 125/71 | HR 81 | Resp 17 | Ht 72.0 in | Wt 218.2 lb

## 2024-02-15 DIAGNOSIS — I10 Essential (primary) hypertension: Secondary | ICD-10-CM

## 2024-02-15 DIAGNOSIS — I739 Peripheral vascular disease, unspecified: Secondary | ICD-10-CM | POA: Diagnosis not present

## 2024-02-15 DIAGNOSIS — Z9862 Peripheral vascular angioplasty status: Secondary | ICD-10-CM

## 2024-02-15 DIAGNOSIS — E785 Hyperlipidemia, unspecified: Secondary | ICD-10-CM

## 2024-02-15 DIAGNOSIS — Z9889 Other specified postprocedural states: Secondary | ICD-10-CM

## 2024-02-18 ENCOUNTER — Encounter (INDEPENDENT_AMBULATORY_CARE_PROVIDER_SITE_OTHER): Payer: Self-pay | Admitting: Nurse Practitioner

## 2024-02-18 NOTE — Progress Notes (Signed)
 Subjective:    Patient ID: Alexander [name], male    DOB: 1950/11/23, 73 y.o.   MRN: [id number] Chief Complaint  Patient presents with   Establish Care    The patient returns to the office for followup and review status post angiogram with intervention on 01/19/2024.   Procedure: Procedure(s) Performed:             1.  Introduction catheter into right lower extremity 3rd order catheter placement               2.    Contrast injection right lower extremity for distal runoff             3.  Percutaneous transluminal angioplasty right popliteal artery to 5 mm             4.  Percutaneous laser atherectomy with transluminal angioplasty of the anterior tibial artery             5.  Star close closure left common femoral arteriotomy   The patient notes improvement in the lower extremity symptoms. No interval shortening of the patient's claudication distance or rest pain symptoms. No new ulcers or wounds have occurred since the last visit, and previous wound appears to be healing well.  The patient and per previous office notes from podiatry.  There have been no significant changes to the patient's overall health care.  No documented history of amaurosis fugax or recent TIA symptoms. There are no recent neurological changes noted. No documented history of DVT, PE or superficial thrombophlebitis. The patient denies recent episodes of angina or shortness of breath.   ABI's Rt=[place] and Lt=[place]  (TBI's Rt=0.67 and Lt=0.90) Duplex US  of the right lower extremity shows monophasic waveforms with multiphasic in the left    Review of Systems  Cardiovascular:  Positive for leg swelling.  Skin:  Positive for wound.  All other systems reviewed and are negative.      Objective:   Physical Exam Vitals reviewed.  HENT:     Head: Normocephalic.  Cardiovascular:     Rate and Rhythm: Normal rate.     Pulses:          Dorsalis pedis pulses are detected w/ Doppler on the right side and detected w/  Doppler on the left side.       Posterior tibial pulses are detected w/ Doppler on the right side and detected w/ Doppler on the left side.  Pulmonary:     Effort: Pulmonary effort is normal.  Skin:    General: Skin is warm and dry.  Neurological:     Mental Status: He is alert and oriented to person, place, and time.  Psychiatric:        Mood and Affect: Mood normal.        Behavior: Behavior normal.        Thought Content: Thought content normal.        Judgment: Judgment normal.     BP 125/71 (BP Location: Right Arm, Patient Position: Sitting, Cuff Size: Normal)   Pulse 81   Resp 17   Ht 6' (1.829 m)   Wt 218 lb 3.2 oz (99 kg)   BMI 29.59 kg/m   Past Medical History:  Diagnosis Date   Acute cholecystitis    Anginal pain (HCC)    CHF (congestive heart failure) (HCC)    Chronic lower back pain    Coronary artery disease    DM (diabetes mellitus), type 2 (HCC)  Elevated LFTs    Facial cellulitis    Frequent falls    GERD (gastroesophageal reflux disease)    History of concussion    History of kidney stones    Hypertension    Ischemic cardiomyopathy    Metabolic acidosis    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)    Pneumonia 07/05/2023   admitted to [facility]   Polyneuropathy    S/P CABG x 5 2017   Sepsis (HCC) 07/05/2023   Severe nonproliferative diabetic retinopathy of left eye (HCC)    Stroke (HCC) 2014   mini, tingling right hand    Social History   Socioeconomic History   Marital status: Married    Spouse name: Not on file   Number of children: Not on file   Years of education: Not on file   Highest education level: Not on file  Occupational History   Not on file  Tobacco Use   Smoking status: Never    Passive exposure: Never   Smokeless tobacco: Never  Vaping Use   Vaping status: Never Used  Substance and Sexual Activity   Alcohol use: No   Drug use: Never   Sexual activity: Yes    Partners: Female  Other Topics  Concern   Not on file  Social History Narrative   Not on file   Social Drivers of Health   Financial Resource Strain: Low Risk  (01/26/2024)   [facility]   Overall Financial Resource Strain (CARDIA)    Difficulty of Paying Living Expenses: Not hard at all  Food Insecurity: No Food Insecurity (01/26/2024)   [facility] from [facility]   Hunger Vital Sign    Worried About Running Out of Food in the Last Year: Never true    Ran Out of Food in the Last Year: Never true  Transportation Needs: No Transportation Needs (01/26/2024)   Received from [facility] - Transportation    In the past 12 months, has lack of transportation kept you from medical appointments or from getting medications?: No    Lack of Transportation (Non-Medical): No  Physical Activity: Not on file  Stress: Not on file  Social Connections: Socially Integrated (01/19/2024)   Social Connection and Isolation Panel [NHANES]    Frequency of Communication with Friends and Family: Three times a week    Frequency of Social Gatherings with Friends and Family: Twice a week    Attends Religious Services: 1 to 4 times per year    Active Member of [employer] or Organizations: Yes    Attends Banker Meetings: 1 to 4 times per year    Marital Status: Married  Catering manager Violence: Not At Risk (01/19/2024)   Humiliation, Afraid, Rape, and Kick questionnaire    Fear of Current or Ex-Partner: No    Emotionally Abused: No    Physically Abused: No    Sexually Abused: No    Past Surgical History:  Procedure Laterality Date   AMPUTATION TOE Right 01/21/2024   Procedure: AMPUTATION, TOE;  Surgeon: Anell Baptist, DPM;  Location: [facility] ORS;  Service: Orthopedics/Podiatry;  Laterality: Right;   CARDIAC CATHETERIZATION Left 08/09/2016   Procedure: Left Heart Cath and Coronary Angiography;  Surgeon: Ronney Cola, MD;  Location: [facility] INVASIVE CV LAB;   Service: Cardiovascular;  Laterality: Left;   CAROTID PTA/STENT INTERVENTION Left 09/28/2023   Procedure: CAROTID PTA/STENT INTERVENTION;  Surgeon: Jackquelyn Mass, MD;  Location:  [facility] INVASIVE CV LAB;  Service: Cardiovascular;  Laterality: Left;   CATARACT EXTRACTION W/PHACO Right 02/06/2023   Procedure: CATARACT EXTRACTION PHACO AND INTRAOCULAR LENS PLACEMENT (IOC) AHMED TUBE SHUNT W/ TUTOPLAST RIGHT DIABETIC  3.94   00:42.6;  Surgeon: Rosa College, MD;  Location: [facility];  Service: Ophthalmology;  Laterality: Right;   COLONOSCOPY WITH PROPOFOL  N/A 02/02/2021   Procedure: COLONOSCOPY WITH PROPOFOL ;  Surgeon: Shane Darling, MD;  Location: [facility] ENDOSCOPY;  Service: Endoscopy;  Laterality: N/A;   CORONARY ARTERY BYPASS GRAFT  2017   x5   IR CHOLANGIOGRAM EXISTING TUBE  12/11/2023   IR EXCHANGE BILIARY DRAIN  09/01/2023   IR EXCHANGE BILIARY DRAIN  11/17/2023   IR PERC CHOLECYSTOSTOMY  07/28/2023   IR RADIOLOGIST EVAL & MGMT  11/17/2023   IRRIGATION AND DEBRIDEMENT FOOT Right 01/21/2024   Procedure: IRRIGATION AND DEBRIDEMENT FOOT;  Surgeon: Anell Baptist, DPM;  Location: [facility] ORS;  Service: Orthopedics/Podiatry;  Laterality: Right;   LOWER EXTREMITY ANGIOGRAPHY Right 01/19/2024   Procedure: Lower Extremity Angiography;  Surgeon: Jackquelyn Mass, MD;  Location: [facility] INVASIVE CV LAB;  Service: Cardiovascular;  Laterality: Right;    Family History  Problem Relation Age of Onset   Hypertension Mother    Diabetes Mother    Anuerysm Sister     No Known Allergies     Latest Ref Rng & Units 01/22/2024    2:00 AM 01/20/2024    5:15 AM 01/19/2024    4:54 AM  CBC  WBC 4.0 - 10.5 K/uL 9.8  6.5  6.0   Hemoglobin 13.0 - 17.0 g/dL 16.1  09.6  04.5   Hematocrit 39.0 - 52.0 % 38.0  36.9  38.7   Platelets 150 - 400 K/uL 237  207  205       CMP     Component Value Date/Time   NA 135 01/22/2024 0200   K 4.1 01/22/2024 0200   CL 105 01/22/2024 0200   CO2 20 (L)  01/22/2024 0200   GLUCOSE 216 (H) 01/22/2024 0200   BUN 18 01/22/2024 0200   CREATININE 0.66 01/22/2024 0200   CALCIUM  8.9 01/22/2024 0200   PROT 6.6 01/22/2024 0200   ALBUMIN 3.3 (L) 01/22/2024 0200   AST 34 01/22/2024 0200   ALT 119 (H) 01/22/2024 0200   ALKPHOS 147 (H) 01/22/2024 0200   BILITOT 1.1 01/22/2024 0200   GFRNONAA >60 01/22/2024 0200     No results found.     Assessment & Plan:   1. History of revascularization procedure of lower extremity (Primary) Recommend:  The patient is status post successful angiogram with intervention.  The patient reports that the claudication symptoms and leg pain has improved.   The patient denies lifestyle limiting changes at this point in time.  No further invasive studies, angiography or surgery at this time. The patient should continue walking and begin a more formal exercise program.  The patient should continue antiplatelet therapy and aggressive treatment of the lipid abnormalities  Continued surveillance is indicated as atherosclerosis is likely to progress with time.    Patient should undergo noninvasive studies as ordered. The patient will follow up with me to review the studies.  He will follow-up in 3 months  2. Primary hypertension Continue antihypertensive medications as already ordered, these medications have been reviewed and there are no changes at this time.  3. Hyperlipidemia, unspecified hyperlipidemia type Continue statin as ordered and reviewed, no changes at this time   Current Outpatient  Medications on File Prior to Visit  Medication Sig Dispense Refill   acetaminophen  (TYLENOL ) 500 MG tablet Take 1,000 mg by mouth every 6 (six) hours as needed for mild pain or headache.     aspirin  EC 81 MG tablet Take 1 tablet (81 mg total) by mouth daily. 30 tablet 11   atorvastatin  (LIPITOR ) 80 MG tablet Take 1 tablet (80 mg total) by mouth daily. Hold until you see your doctor and have your liver enzymes repeated      clopidogrel  (PLAVIX ) 75 MG tablet Take 1 tablet (75 mg total) by mouth daily. 90 tablet 1   HYDROcodone -acetaminophen  (NORCO/VICODIN) 5-325 MG tablet Take 1-2 tablets by mouth every 6 (six) hours as needed for moderate pain (pain score 4-6) or severe pain (pain score 7-10).     JARDIANCE 25 MG TABS tablet Take 25 mg by mouth daily.     LANTUS  SOLOSTAR 100 UNIT/ML Solostar Pen Inject 38 Units into the skin at bedtime.     OZEMPIC, 2 MG/DOSE, 8 MG/3ML SOPN Inject 2 mg into the skin every 7 (seven) days. Sundays     sacubitril -valsartan  (ENTRESTO ) 24-26 MG Take 1 tablet by mouth every 12 (twelve) hours. 60 tablet 11   spironolactone (ALDACTONE) 25 MG tablet Take 12.5 mg by mouth daily.     feeding supplement (ENSURE ENLIVE / ENSURE PLUS) LIQD Take 237 mLs by mouth 2 (two) times daily between meals. (Patient not taking: Reported on 02/15/2024) 237 mL 12   No current facility-administered medications on file prior to visit.    There are no Patient Instructions on file for this visit. No follow-ups on file.   Zarek Relph E Nyla Creason, NP

## 2024-02-19 LAB — VAS US ABI WITH/WO TBI
Left ABI: >1
Right ABI: 1.62

## 2024-02-28 ENCOUNTER — Other Ambulatory Visit: Payer: Self-pay

## 2024-02-28 ENCOUNTER — Inpatient Hospital Stay
Admission: EM | Admit: 2024-02-28 | Discharge: 2024-03-03 | DRG: 240 | Disposition: A | Discharge status: Home Health | Attending: Internal Medicine | Admitting: Internal Medicine

## 2024-02-28 ENCOUNTER — Emergency Department

## 2024-02-28 DIAGNOSIS — K219 Gastro-esophageal reflux disease without esophagitis: Secondary | ICD-10-CM | POA: Diagnosis present

## 2024-02-28 DIAGNOSIS — I96 Gangrene, not elsewhere classified: Secondary | ICD-10-CM

## 2024-02-28 DIAGNOSIS — Z8673 Personal history of transient ischemic attack (TIA), and cerebral infarction without residual deficits: Secondary | ICD-10-CM | POA: Diagnosis not present

## 2024-02-28 DIAGNOSIS — E663 Overweight: Secondary | ICD-10-CM

## 2024-02-28 DIAGNOSIS — Z833 Family history of diabetes mellitus: Secondary | ICD-10-CM | POA: Diagnosis not present

## 2024-02-28 DIAGNOSIS — L03031 Cellulitis of right toe: Secondary | ICD-10-CM | POA: Diagnosis present

## 2024-02-28 DIAGNOSIS — I255 Ischemic cardiomyopathy: Secondary | ICD-10-CM | POA: Diagnosis present

## 2024-02-28 DIAGNOSIS — M79671 Pain in right foot: Principal | ICD-10-CM

## 2024-02-28 DIAGNOSIS — Z8249 Family history of ischemic heart disease and other diseases of the circulatory system: Secondary | ICD-10-CM

## 2024-02-28 DIAGNOSIS — Z7984 Long term (current) use of oral hypoglycemic drugs: Secondary | ICD-10-CM

## 2024-02-28 DIAGNOSIS — I700 Atherosclerosis of aorta: Secondary | ICD-10-CM | POA: Diagnosis not present

## 2024-02-28 DIAGNOSIS — I70261 Atherosclerosis of native arteries of extremities with gangrene, right leg: Secondary | ICD-10-CM | POA: Diagnosis not present

## 2024-02-28 DIAGNOSIS — I2510 Atherosclerotic heart disease of native coronary artery without angina pectoris: Secondary | ICD-10-CM | POA: Diagnosis present

## 2024-02-28 DIAGNOSIS — Z9841 Cataract extraction status, right eye: Secondary | ICD-10-CM

## 2024-02-28 DIAGNOSIS — Z8619 Personal history of other infectious and parasitic diseases: Secondary | ICD-10-CM

## 2024-02-28 DIAGNOSIS — I110 Hypertensive heart disease with heart failure: Secondary | ICD-10-CM | POA: Diagnosis present

## 2024-02-28 DIAGNOSIS — Z7982 Long term (current) use of aspirin: Secondary | ICD-10-CM | POA: Diagnosis not present

## 2024-02-28 DIAGNOSIS — E1142 Type 2 diabetes mellitus with diabetic polyneuropathy: Secondary | ICD-10-CM | POA: Diagnosis present

## 2024-02-28 DIAGNOSIS — E113391 Type 2 diabetes mellitus with moderate nonproliferative diabetic retinopathy without macular edema, right eye: Secondary | ICD-10-CM | POA: Diagnosis present

## 2024-02-28 DIAGNOSIS — I998 Other disorder of circulatory system: Secondary | ICD-10-CM | POA: Diagnosis not present

## 2024-02-28 DIAGNOSIS — E11621 Type 2 diabetes mellitus with foot ulcer: Secondary | ICD-10-CM | POA: Diagnosis present

## 2024-02-28 DIAGNOSIS — Z8679 Personal history of other diseases of the circulatory system: Secondary | ICD-10-CM | POA: Diagnosis not present

## 2024-02-28 DIAGNOSIS — Z961 Presence of intraocular lens: Secondary | ICD-10-CM | POA: Diagnosis present

## 2024-02-28 DIAGNOSIS — M7989 Other specified soft tissue disorders: Secondary | ICD-10-CM | POA: Diagnosis present

## 2024-02-28 DIAGNOSIS — Z794 Long term (current) use of insulin: Secondary | ICD-10-CM | POA: Diagnosis not present

## 2024-02-28 DIAGNOSIS — E1169 Type 2 diabetes mellitus with other specified complication: Secondary | ICD-10-CM

## 2024-02-28 DIAGNOSIS — Z87820 Personal history of traumatic brain injury: Secondary | ICD-10-CM

## 2024-02-28 DIAGNOSIS — Z89421 Acquired absence of other right toe(s): Secondary | ICD-10-CM

## 2024-02-28 DIAGNOSIS — I5022 Chronic systolic (congestive) heart failure: Secondary | ICD-10-CM

## 2024-02-28 DIAGNOSIS — Z79899 Other long term (current) drug therapy: Secondary | ICD-10-CM

## 2024-02-28 DIAGNOSIS — L03115 Cellulitis of right lower limb: Secondary | ICD-10-CM | POA: Diagnosis present

## 2024-02-28 DIAGNOSIS — Z8701 Personal history of pneumonia (recurrent): Secondary | ICD-10-CM

## 2024-02-28 DIAGNOSIS — E1152 Type 2 diabetes mellitus with diabetic peripheral angiopathy with gangrene: Secondary | ICD-10-CM | POA: Diagnosis present

## 2024-02-28 DIAGNOSIS — Z6829 Body mass index (BMI) 29.0-29.9, adult: Secondary | ICD-10-CM

## 2024-02-28 DIAGNOSIS — Z7902 Long term (current) use of antithrombotics/antiplatelets: Secondary | ICD-10-CM

## 2024-02-28 DIAGNOSIS — Z87442 Personal history of urinary calculi: Secondary | ICD-10-CM | POA: Diagnosis not present

## 2024-02-28 DIAGNOSIS — G8929 Other chronic pain: Secondary | ICD-10-CM | POA: Diagnosis present

## 2024-02-28 DIAGNOSIS — Z7985 Long-term (current) use of injectable non-insulin antidiabetic drugs: Secondary | ICD-10-CM

## 2024-02-28 DIAGNOSIS — E113492 Type 2 diabetes mellitus with severe nonproliferative diabetic retinopathy without macular edema, left eye: Secondary | ICD-10-CM | POA: Diagnosis present

## 2024-02-28 DIAGNOSIS — Z9181 History of falling: Secondary | ICD-10-CM

## 2024-02-28 DIAGNOSIS — M545 Low back pain, unspecified: Secondary | ICD-10-CM | POA: Diagnosis present

## 2024-02-28 DIAGNOSIS — E785 Hyperlipidemia, unspecified: Secondary | ICD-10-CM

## 2024-02-28 DIAGNOSIS — Z951 Presence of aortocoronary bypass graft: Secondary | ICD-10-CM

## 2024-02-28 DIAGNOSIS — M79674 Pain in right toe(s): Secondary | ICD-10-CM | POA: Diagnosis present

## 2024-02-28 LAB — COMPREHENSIVE METABOLIC PANEL WITH GFR
ALT: 45 U/L — ABNORMAL HIGH (ref 0–44)
AST: 26 U/L (ref 15–41)
Albumin: 3.7 g/dL (ref 3.5–5.0)
Alkaline Phosphatase: 81 U/L (ref 38–126)
Anion gap: 10 (ref 5–15)
BUN: 27 mg/dL — ABNORMAL HIGH (ref 8–23)
CO2: 20 mmol/L — ABNORMAL LOW (ref 22–32)
Calcium: 9.2 mg/dL (ref 8.9–10.3)
Chloride: 106 mmol/L (ref 98–111)
Creatinine, Ser: 0.77 mg/dL (ref 0.61–1.24)
GFR, Estimated: >60 mL/min (ref >60)
Glucose, Bld: 131 mg/dL — ABNORMAL HIGH (ref 70–99)
Potassium: 4.1 mmol/L (ref 3.5–5.1)
Sodium: 136 mmol/L (ref 135–145)
Total Bilirubin: 0.9 mg/dL (ref 0.0–1.2)
Total Protein: 7.2 g/dL (ref 6.5–8.1)

## 2024-02-28 LAB — CBC WITH DIFFERENTIAL/PLATELET
Abs Immature Granulocytes: 0.03 10*3/uL (ref 0.00–0.07)
Basophils Absolute: 0 10*3/uL (ref 0.0–0.1)
Basophils Relative: 0 %
Eosinophils Absolute: 0.1 10*3/uL (ref 0.0–0.5)
Eosinophils Relative: 1 %
HCT: 43.8 % (ref 39.0–52.0)
Hemoglobin: 14.6 g/dL (ref 13.0–17.0)
Immature Granulocytes: 0 %
Lymphocytes Relative: 24 %
Lymphs Abs: 1.9 10*3/uL (ref 0.7–4.0)
MCH: 31.9 pg (ref 26.0–34.0)
MCHC: 33.3 g/dL (ref 30.0–36.0)
MCV: 95.6 fL (ref 80.0–100.0)
Monocytes Absolute: 0.7 10*3/uL (ref 0.1–1.0)
Monocytes Relative: 9 %
Neutro Abs: 5 10*3/uL (ref 1.7–7.7)
Neutrophils Relative %: 66 %
Platelets: 255 10*3/uL (ref 150–400)
RBC: 4.58 MIL/uL (ref 4.22–5.81)
RDW: 14.2 % (ref 11.5–15.5)
WBC: 7.7 10*3/uL (ref 4.0–10.5)
nRBC: 0 % (ref 0.0–0.2)

## 2024-02-28 LAB — APTT: aPTT: 33 s (ref 24–36)

## 2024-02-28 LAB — HEMOGLOBIN A1C
Hgb A1c MFr Bld: 6.4 % — ABNORMAL HIGH (ref 4.8–5.6)
Mean Plasma Glucose: 136.98 mg/dL

## 2024-02-28 LAB — PROTIME-INR
INR: 1 (ref 0.8–1.2)
Prothrombin Time: 13.8 s (ref 11.4–15.2)

## 2024-02-28 LAB — CBG MONITORING, ED: Glucose-Capillary: 114 mg/dL — ABNORMAL HIGH (ref 70–99)

## 2024-02-28 MED ORDER — ATORVASTATIN CALCIUM 20 MG PO TABS
80.0000 mg | ORAL_TABLET | Freq: Every day | ORAL | Status: DC
  Administered 2024-02-29 – 2024-03-03 (×3): 80 mg via ORAL
  Filled 2024-02-28 (×3): qty 4

## 2024-02-28 MED ORDER — ASPIRIN 81 MG PO TBEC
81.0000 mg | DELAYED_RELEASE_TABLET | Freq: Every day | ORAL | Status: DC
  Administered 2024-02-29 – 2024-03-03 (×3): 81 mg via ORAL
  Filled 2024-02-28 (×4): qty 1

## 2024-02-28 MED ORDER — ACETAMINOPHEN 650 MG RE SUPP: 650.0000 mg | Freq: Four times a day (QID) | RECTAL | Status: DC | PRN

## 2024-02-28 MED ORDER — OXYCODONE HCL 5 MG PO TABS
5.0000 mg | ORAL_TABLET | ORAL | Status: DC | PRN
  Administered 2024-03-01: 5 mg via ORAL
  Filled 2024-02-28: qty 1

## 2024-02-28 MED ORDER — ACETAMINOPHEN 325 MG PO TABS
650.0000 mg | ORAL_TABLET | Freq: Four times a day (QID) | ORAL | Status: DC | PRN
  Administered 2024-03-03: 650 mg via ORAL
  Filled 2024-02-28: qty 2

## 2024-02-28 MED ORDER — INSULIN GLARGINE-YFGN 100 UNIT/ML ~~LOC~~ SOLN
36.0000 [IU] | Freq: Every day | SUBCUTANEOUS | Status: DC
  Administered 2024-02-28: 36 [IU] via SUBCUTANEOUS
  Filled 2024-02-28: qty 0.36

## 2024-02-28 MED ORDER — MORPHINE SULFATE (PF) 2 MG/ML IV SOLN
1.0000 mg | INTRAVENOUS | Status: DC | PRN
  Administered 2024-03-01 – 2024-03-02 (×3): 1 mg via INTRAVENOUS
  Filled 2024-02-28 (×3): qty 1

## 2024-02-28 MED ORDER — INSULIN ASPART 100 UNIT/ML IJ SOLN: 0.0000 [IU] | Freq: Every day | INTRAMUSCULAR | Status: DC

## 2024-02-28 MED ORDER — PIPERACILLIN-TAZOBACTAM 3.375 G IVPB
3.3750 g | Freq: Once | INTRAVENOUS | Status: AC
  Administered 2024-02-28: 3.375 g via INTRAVENOUS
  Filled 2024-02-28: qty 50

## 2024-02-28 MED ORDER — INSULIN ASPART 100 UNIT/ML IJ SOLN
0.0000 [IU] | Freq: Three times a day (TID) | INTRAMUSCULAR | Status: DC
  Administered 2024-02-29: 2 [IU] via SUBCUTANEOUS
  Administered 2024-03-01: 1 [IU] via SUBCUTANEOUS
  Administered 2024-03-02: 2 [IU] via SUBCUTANEOUS
  Administered 2024-03-03 (×2): 1 [IU] via SUBCUTANEOUS
  Filled 2024-02-28 (×3): qty 1

## 2024-02-28 MED ORDER — HEPARIN (PORCINE) 25000 UT/250ML-% IV SOLN
1500.0000 [IU]/h | INTRAVENOUS | Status: DC
  Administered 2024-02-28 – 2024-03-01 (×3): 1400 [IU]/h via INTRAVENOUS
  Administered 2024-03-02: 1500 [IU]/h via INTRAVENOUS
  Filled 2024-02-28 (×4): qty 250

## 2024-02-28 MED ORDER — PIPERACILLIN-TAZOBACTAM 3.375 G IVPB
3.3750 g | Freq: Three times a day (TID) | INTRAVENOUS | Status: DC
  Administered 2024-02-28 – 2024-03-01 (×5): 3.375 g via INTRAVENOUS
  Filled 2024-02-28 (×5): qty 50

## 2024-02-28 MED ORDER — ONDANSETRON HCL 4 MG PO TABS: 4.0000 mg | ORAL_TABLET | Freq: Four times a day (QID) | ORAL | Status: DC | PRN

## 2024-02-28 MED ORDER — ENOXAPARIN SODIUM 40 MG/0.4ML IJ SOSY: 40.0000 mg | PREFILLED_SYRINGE | Freq: Every day | INTRAMUSCULAR | Status: DC

## 2024-02-28 MED ORDER — CLOPIDOGREL BISULFATE 75 MG PO TABS
75.0000 mg | ORAL_TABLET | Freq: Every day | ORAL | Status: DC
  Administered 2024-02-29 – 2024-03-03 (×4): 75 mg via ORAL
  Filled 2024-02-28 (×4): qty 1

## 2024-02-28 MED ORDER — SACUBITRIL-VALSARTAN 24-26 MG PO TABS
1.0000 | ORAL_TABLET | Freq: Two times a day (BID) | ORAL | Status: DC
  Administered 2024-02-28 – 2024-03-03 (×8): 1 via ORAL
  Filled 2024-02-28 (×8): qty 1

## 2024-02-28 MED ORDER — SPIRONOLACTONE 12.5 MG HALF TABLET
12.5000 mg | ORAL_TABLET | Freq: Every day | ORAL | Status: DC
  Administered 2024-02-29 – 2024-03-03 (×4): 12.5 mg via ORAL
  Filled 2024-02-28 (×4): qty 1

## 2024-02-28 MED ORDER — EMPAGLIFLOZIN 25 MG PO TABS
25.0000 mg | ORAL_TABLET | Freq: Every day | ORAL | Status: DC
  Administered 2024-02-29 – 2024-03-03 (×3): 25 mg via ORAL
  Filled 2024-02-28 (×4): qty 1

## 2024-02-28 MED ORDER — VANCOMYCIN HCL 1500 MG/300ML IV SOLN
1500.0000 mg | Freq: Two times a day (BID) | INTRAVENOUS | Status: DC
  Administered 2024-02-29 – 2024-03-03 (×7): 1500 mg via INTRAVENOUS
  Filled 2024-02-28 (×10): qty 300

## 2024-02-28 MED ORDER — HEPARIN BOLUS VIA INFUSION
6000.0000 [IU] | Freq: Once | INTRAVENOUS | Status: AC
  Administered 2024-02-28: 6000 [IU] via INTRAVENOUS
  Filled 2024-02-28: qty 6000

## 2024-02-28 MED ORDER — ONDANSETRON HCL 4 MG/2ML IJ SOLN: 4.0000 mg | Freq: Four times a day (QID) | INTRAMUSCULAR | Status: DC | PRN

## 2024-02-28 MED ORDER — ENOXAPARIN SODIUM 40 MG/0.4ML IJ SOSY: 40.0000 mg | PREFILLED_SYRINGE | INTRAMUSCULAR | Status: DC

## 2024-02-28 MED ORDER — VANCOMYCIN HCL 2000 MG/400ML IV SOLN
2000.0000 mg | Freq: Once | INTRAVENOUS | Status: AC
  Administered 2024-02-28: 2000 mg via INTRAVENOUS
  Filled 2024-02-28: qty 400

## 2024-02-28 NOTE — Assessment & Plan Note (Addendum)
 Vascular team to perform angiogram on 5/9.  Patient on aspirin  Plavix  and Lipitor .  Continue heparin  drip.

## 2024-02-28 NOTE — H&P (Signed)
 History and Physical    Patient: Alexander [name]:[id number] DOB: [birth year] DOA: 02/28/2024 DOS: the patient was seen and examined on 02/28/2024 PCP: Deliah Fells, NP  Patient coming from: Home  Chief Complaint:  Chief Complaint  Patient presents with   Toe Pain   HPI: Alexander [name] is a 73 y.o. male with medical history significant of CAD, CHF, diabetes, hypertension, stroke, peripheral vascular disease.  He presents to the ER after being sent in from the podiatry office for gangrene on 4th and 5th toe.  Patient states that he has been having some pain for a while in his feet and can hardly stand on it.  He noticed some blackness on his toe for about a week.  He had an amputation of his second toe back on March 30.  No fever but has had some chills.  Podiatry sent him for vascular evaluation.  May end up needing an amputation. Review of Systems: Review of Systems  Constitutional:  Negative for chills and fever.  HENT:  Negative for hearing loss.   Eyes:  Positive for blurred vision.  Respiratory:  Negative for cough and shortness of breath.   Cardiovascular:  Negative for chest pain.  Gastrointestinal:  Negative for abdominal pain, blood in stool, constipation, diarrhea, nausea and vomiting.  Genitourinary:  Negative for dysuria.  Musculoskeletal:  Positive for joint pain.  Skin:  Negative for rash.  Neurological:  Negative for dizziness.  Endo/Heme/Allergies:  Does not bruise/bleed easily.  Psychiatric/Behavioral:  Negative for depression.     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)    Pneumonia 07/05/2023   admitted to [facility]   Polyneuropathy    S/P CABG x 5 2017   Sepsis (HCC) 07/05/2023   Severe nonproliferative diabetic retinopathy of left eye (HCC)    Stroke (HCC) 2014   mini, tingling right hand   Past Surgical History:  Procedure Laterality Date   AMPUTATION TOE Right 01/21/2024   Procedure: AMPUTATION, TOE;  Surgeon: Anell Baptist, DPM;  Location: [facility];  Service: Orthopedics/Podiatry;  Laterality: Right;   CARDIAC CATHETERIZATION Left 08/09/2016   Procedure: Left Heart Cath and Coronary Angiography;  Surgeon: Ronney Cola, MD;  Location: [facility];  Service: Cardiovascular;  Laterality: Left;   CAROTID PTA/STENT INTERVENTION Left 09/28/2023   Procedure: CAROTID PTA/STENT INTERVENTION;  Surgeon: Jackquelyn Mass, MD;  Location: [facility];  Service: Cardiovascular;  Laterality: Left;   CATARACT EXTRACTION W/PHACO Right 02/06/2023   Procedure: CATARACT EXTRACTION PHACO AND INTRAOCULAR LENS PLACEMENT (IOC) AHMED TUBE SHUNT W/ TUTOPLAST RIGHT DIABETIC  3.94   00:42.6;  Surgeon: Rosa College, MD;  Location[facility];  Service: Ophthalmology;  Laterality: Right;   COLONOSCOPY WITH PROPOFOL  N/A 02/02/2021   Procedure: COLONOSCOPY WITH PROPOFOL ;  Surgeon: Shane Darling, MD;  Location: [facility];  Service: Endoscopy;  Laterality: N/A;   CORONARY ARTERY  BYPASS GRAFT  2017   x5   IR CHOLANGIOGRAM EXISTING TUBE  12/11/2023   IR EXCHANGE BILIARY DRAIN  09/01/2023   IR EXCHANGE BILIARY DRAIN  11/17/2023   IR PERC CHOLECYSTOSTOMY  07/28/2023   IR RADIOLOGIST EVAL & MGMT  11/17/2023   IRRIGATION AND DEBRIDEMENT FOOT Right 01/21/2024   Procedure: IRRIGATION AND DEBRIDEMENT FOOT;  Surgeon: Anell Baptist, DPM;  Location: [facility];  Service: Orthopedics/Podiatry;  Laterality: Right;   LOWER EXTREMITY ANGIOGRAPHY Right 01/19/2024   Procedure: Lower  Extremity Angiography;  Surgeon: Jackquelyn Mass, MD;  Location: [facility];  Service: Cardiovascular;  Laterality: Right;   Social History:  reports that he has never smoked. He has never been exposed to tobacco smoke. He has never used smokeless tobacco. He reports that he does not drink alcohol and does not use drugs.  No Known Allergies  Family History  Problem Relation Age of Onset   Hypertension Mother    Diabetes Mother    Cirrhosis Father    Anuerysm Sister    CAD Brother    Cancer Brother     Prior to Admission medications   Medication Sig Start Date End Date Taking? Authorizing Provider  acetaminophen  (TYLENOL ) 500 MG tablet Take 1,000 mg by mouth every 6 (six) hours as needed for mild pain or headache.   Yes [provider]  aspirin  EC 81 MG tablet Take 1 tablet (81 mg total) by mouth daily. 09/29/23 09/28/24 Yes Althia Atlas, MD  atorvastatin  (LIPITOR ) 80 MG tablet Take 1 tablet (80 mg total) by mouth daily. Hold until you see your doctor and have your liver enzymes repeated 07/30/23  Yes Luna Salinas, MD  clopidogrel  (PLAVIX ) 75 MG tablet Take 1 tablet (75 mg total) by mouth daily. 09/30/23 03/28/24 Yes Althia Atlas, MD  feeding supplement (ENSURE ENLIVE / ENSURE PLUS) LIQD Take 237 mLs by mouth 2 (two) times daily between meals. Patient not taking: Reported on 02/15/2024 07/30/23   Luna Salinas, MD  JARDIANCE 25 MG TABS tablet Take 25 mg by mouth daily. 12/27/23  Yes [provider]  LANTUS  SOLOSTAR 100 UNIT/ML Solostar Pen Inject 38 Units into the skin at bedtime. 07/14/23  Yes [provider]  OZEMPIC, 2 MG/DOSE, 8 MG/3ML SOPN Inject 2 mg into the skin every 7 (seven) days. Sundays   Yes [provider]  sacubitril -valsartan  (ENTRESTO ) 24-26 MG Take 1 tablet by mouth every 12 (twelve) hours. 09/29/23 09/28/24 Yes Althia Atlas, MD  spironolactone (ALDACTONE) 25 MG tablet Take 12.5 mg by mouth daily.   Yes [provider]     Physical Exam: Vitals:   02/28/24 1255 02/28/24 1256 02/28/24 1704  BP: 121/74  117/72  Pulse: 76  72  Resp: 18  18  Temp: 97.9 F (36.6 C)    TempSrc: Oral    SpO2: 100%  100%  Weight:  98.4 kg   Height:  6' (1.829 m)    Physical Exam HENT:     Head: Normocephalic.     Mouth/Throat:     Pharynx: No oropharyngeal exudate.  Eyes:     General: Lids are normal.     Conjunctiva/sclera: Conjunctivae normal.  Cardiovascular:     Rate and Rhythm: Normal rate and regular rhythm.     Heart sounds: Normal heart sounds, S1 normal and S2 normal.  Pulmonary:     Breath sounds: No decreased breath sounds, wheezing, rhonchi or rales.  Abdominal:     Palpations: Abdomen is soft.  Tenderness: There is no abdominal tenderness.  Musculoskeletal:     Right lower leg: No swelling.     Left lower leg: Swelling present.  Skin:    General: Skin is warm.     Comments: Gangrene on the lateral side of 5th and 4th toe right foot.  Neurological:     Mental Status: He is alert and oriented to person, place, and time.     Data Reviewed: Creatinine 0.77, glucose 131, sodium 136, potassium 4.1, white blood cell count 7.7, hemoglobin 14.6, platelet count 255 Right foot x-ray shows interval amputation of the second toe at the MTP joint level, no findings characteristic of osteomyelitis, dorsal subcutaneous edema along the forefoot EKG ordered  Assessment and Plan: * Ischemic pain of right foot Vascular team planning on angiogram on Friday.  Patient on aspirin  Plavix  and Lipitor .  Will add heparin  drip.  Gangrene (HCC) Seen on the lateral side 5th and 4th toe right foot..  Patient also has an ulcer on the first toe.  Blood cultures ordered by ER physician.  Will get a wound culture.  Started on empiric Zosyn  and vancomycin .  Type 2 diabetes mellitus with hyperlipidemia (HCC) Continue Lantus  insulin  and Jardiance on hold Ozempic.  Check a hemoglobin A1c.  Placed on sliding  scale.  Overweight (BMI 25.0-29.9) BMI 29.43 with current height and weight in computer  History of CAD (coronary artery disease) On aspirin  Plavix  and Lipitor   Chronic systolic CHF (congestive heart failure) (HCC) EF 30 to 35%.  Continue Jardiance, Entresto  and Aldactone.      Advance Care Planning:   Code Status: Full Code   Consults: Podiatry, vascular  Family Communication: Wife at bedside  Severity of Illness: The appropriate patient status for this patient is INPATIENT. Inpatient status is judged to be reasonable and necessary in order to provide the required intensity of service to ensure the patient's safety. The patient's presenting symptoms, physical exam findings, and initial radiographic and laboratory data in the context of their chronic comorbidities is felt to place them at high risk for further clinical deterioration. Furthermore, it is not anticipated that the patient will be medically stable for discharge from the hospital within 2 midnights of admission.   * I certify that at the point of admission it is my clinical judgment that the patient will require inpatient hospital care spanning beyond 2 midnights from the point of admission due to high intensity of service, high risk for further deterioration and high frequency of surveillance required.*  Author: Verla Glaze, MD 02/28/2024 6:12 PM  For on call review [email or web address].

## 2024-02-28 NOTE — ED Notes (Signed)
 MD at bedside.

## 2024-02-28 NOTE — Assessment & Plan Note (Signed)
 BMI 29.43 with current height and weight in computer

## 2024-02-28 NOTE — Assessment & Plan Note (Addendum)
 Gangrene on the lateral side 4th toe right foot and more circumferential on the fifth toe of the right foot today.  Patient also has an ulcer on the first toe.  Blood cultures negative for 2 days.  Zosyn  switched over to Rocephin .  Continue vancomycin .

## 2024-02-28 NOTE — Assessment & Plan Note (Addendum)
 EF 30 to 35%.  Continue Jardiance , Entresto  and Aldactone .  No signs of heart failure while here in the hospital.

## 2024-02-28 NOTE — Progress Notes (Signed)
 PHARMACY - ANTICOAGULATION CONSULT NOTE  Pharmacy Consult for Heparin  Infusion Indication: Ischemic Foot   No Known Allergies  Patient Measurements: Height: 6' (182.9 cm) Weight: 98.4 kg (217 lb) IBW/kg (Calculated) : 77.6 HEPARIN  DW (KG): 97.4  Vital Signs: Temp: 98.3 F (36.8 C) (05/07 1814) Temp Source: Oral (05/07 1814) BP: 117/72 (05/07 1704) Pulse Rate: 72 (05/07 1704)  Labs: Recent Labs    02/28/24 1301  HGB 14.6  HCT 43.8  PLT 255  CREATININE 0.77    Estimated Creatinine Clearance: 101.4 mL/min (by C-G formula based on SCr of 0.77 mg/dL).   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)    Pneumonia 07/05/2023   admitted to [facility]   Polyneuropathy    S/P CABG x 5 2017   Sepsis (HCC) 07/05/2023   Severe nonproliferative diabetic retinopathy of left eye (HCC)    Stroke (HCC) 2014   mini, tingling right hand    Medications:  Scheduled:   [START ON 02/29/2024] aspirin  EC  81 mg Oral Daily   [START ON 02/29/2024] atorvastatin   80 mg Oral Daily   [START ON 02/29/2024] clopidogrel   75 mg Oral Daily   [START ON 02/29/2024] empagliflozin  25 mg Oral Daily   insulin  aspart  0-5 Units Subcutaneous QHS   [START ON 02/29/2024] insulin  aspart  0-9 Units Subcutaneous TID WC   insulin  glargine-yfgn  36 Units Subcutaneous QHS   sacubitril -valsartan   1 tablet Oral Q12H   [START ON 02/29/2024] spironolactone  12.5 mg Oral Daily   Infusions:   piperacillin -tazobactam (ZOSYN )  IV     [START ON 02/29/2024]  vancomycin      vancomycin  2,000 mg (02/28/24 1811)    Assessment: 73 yo male sent from podiatry office for gangrene on 4th and 5th toe. He has a PMH of CAD, Stroke, PVD, DM and CHF. Pharmacy consulted for heparin  dosing and monitoring for ischemic foot. Vascular consulted and planning angiogram on Friday   Goal of Therapy:  Heparin  level 0.3-0.7 units/ml Monitor platelets by anticoagulation protocol: Yes   Plan:  Baseline labs ordered Give 6000 units bolus x 1 Start heparin  infusion at 1400 units/hr Check anti-Xa level in 8 hours and daily while on heparin  Continue to monitor H&H and platelets. CBC ordered daily per protocol.  Malone Sear, PharmD, BCPS Clinical Pharmacist 02/28/2024 6:32 PM

## 2024-02-28 NOTE — ED Notes (Signed)
 Wife at bedside with patient. Patient eating dinner tray

## 2024-02-28 NOTE — Progress Notes (Signed)
 Pharmacy Antibiotic Note  Alexander [name] is a 73 y.o. male admitted on 02/28/2024 with wound infection of toe. Patient was sent from outpatient podiatry office to ED for gangrene of 4th and 5th toe. He has PMH of DM, PVD, CAD, and CHF. Pharmacy has been consulted for Vancomycin  and Zosyn  dosing. Patient received vancomycin  2g IV in ED.   Plan: Start Vancomycin  1500 mg IV Q 12 hrs. Goal AUC 400-550. Expected AUC: 526 SCr used: 0.8 Vd: 0.72  Start Zosyn  3.375 IV EI every 8 hours   Height: 6' (182.9 cm) Weight: 98.4 kg (217 lb) IBW/kg (Calculated) : 77.6  Temp (24hrs), Avg:97.9 F (36.6 C), Min:97.9 F (36.6 C), Max:97.9 F (36.6 C)  Recent Labs  Lab 02/28/24 1301  WBC 7.7  CREATININE 0.77    Estimated Creatinine Clearance: 101.4 mL/min (by C-G formula based on SCr of 0.77 mg/dL).    No Known Allergies  Antimicrobials this admission: 5/7 Vancomycin   >>  5/7 Zosyn   >>    Microbiology results: 5/7 BCx: pending 5/7 Wound Cx: pending  Thank you for allowing pharmacy to be a part of this patient's care.  Malone Sear, PharmD, BCPS Clinical Pharmacist 02/28/2024 6:03 PM

## 2024-02-28 NOTE — ED Provider Notes (Signed)
 [facility] Provider Note    Event Date/Time   First MD Initiated Contact with Patient 02/28/24 1557     (approximate)   History   Toe Pain   HPI  Alexander [name] is a 73 y.o. male who presents to the ED for evaluation of Toe Pain   I review a podiatric clinic visit from earlier today, patient sent to the ED due to nonhealing wounds on 4th and 5th toes on the right foot for vascular evaluation for possible need for additional revascularization..  In March patient was admitted for right lower leg ischemia and associated gangrenous toes.  Right popliteal and anterior tibial angioplasty, amputation of second toe in the right foot.,  Aspirin  and Plavix   Patient presents due to wounds on the 4th and 5th toes on his right foot that are painful.  As well as swelling and pain coming up that lateral aspect of his right foot from these toes.   Physical Exam   Triage Vital Signs: ED Triage Vitals  Encounter Vitals Group     BP 02/28/24 1255 121/74     Systolic BP Percentile --      Diastolic BP Percentile --      Pulse Rate 02/28/24 1255 76     Resp 02/28/24 1255 18     Temp 02/28/24 1255 97.9 F (36.6 C)     Temp Source 02/28/24 1255 Oral     SpO2 02/28/24 1255 100 %     Weight 02/28/24 1256 217 lb (98.4 kg)     Height 02/28/24 1256 6' (1.829 m)     Head Circumference --      Peak Flow --      Pain Score 02/28/24 1256 7     Pain Loc --      Pain Education --      Exclude from Growth Chart --     Most recent vital signs: Vitals:   02/28/24 1255 02/28/24 1704  BP: 121/74 117/72  Pulse: 76 72  Resp: 18 18  Temp: 97.9 F (36.6 C)   SpO2: 100% 100%    General: Awake, no distress.  CV:  Good peripheral perfusion.  Very faintly palpable DP pulse on the right foot Resp:  Normal effort.  Abd:  No distention.  MSK:  Right foot as pictured below.  He has multiple ulcers, to the medial aspect of the great toe but no surrounding erythema of this 1.   Wounds to the lateral aspect of the 4th and 5th toes, erythema and warmth spreading proximally from them Neuro:  No focal deficits appreciated. Other:     ED Results / Procedures / Treatments   Labs (all labs ordered are listed, but only abnormal results are displayed) Labs Reviewed  COMPREHENSIVE METABOLIC PANEL WITH GFR - Abnormal; Notable for the following components:      Result Value   CO2 20 (*)    Glucose, Bld 131 (*)    BUN 27 (*)    ALT 45 (*)    All other components within normal limits  CULTURE, BLOOD (ROUTINE X 2)  CULTURE, BLOOD (ROUTINE X 2)  CBC WITH DIFFERENTIAL/PLATELET    EKG   RADIOLOGY Plain film of the right foot interpreted by me without clear signs of acute osteomyelitis, fracture or dislocation  Official radiology report(s): DG Foot Complete Right Result Date: 02/28/2024 CLINICAL DATA:  Toe pain EXAM: RIGHT FOOT COMPLETE - 3+ VIEW COMPARISON:  01/17/2024 FINDINGS: Interval amputation of  the second toe at the MTP joint level. Bandaging along the fourth and fifth toes and separately along the great toe. No bony destructive findings characteristic of osteomyelitis. Atheromatous vascular calcifications. Plantar calcaneal spur. Dorsal subcutaneous edema along the forefoot. IMPRESSION: 1. Interval amputation of the second toe at the MTP joint level. 2. No bony destructive findings characteristic of osteomyelitis. 3. Dorsal subcutaneous edema along the forefoot. 4. Atheromatous vascular calcifications. 5. Plantar calcaneal spur. Electronically Signed   By: Freida Jes M.D.   On: 02/28/2024 14:42    PROCEDURES and INTERVENTIONS:  Procedures  Medications  piperacillin -tazobactam (ZOSYN ) IVPB 3.375 g (has no administration in time range)  vancomycin  (VANCOREADY) IVPB 2000 mg/400 mL (has no administration in time range)     IMPRESSION / MDM / ASSESSMENT AND PLAN / ED COURSE  I reviewed the triage vital signs and the nursing notes.  Differential  diagnosis includes, but is not limited to, sepsis, cellulitis, osteomyelitis, abscess, limb ischemia  {Patient presents with symptoms of an acute illness or injury that is potentially life-threatening.  Patient presents with nonhealing ulcerative lesions to his 4th and 5th toes on the right foot requiring admission for antibiotics and possible revascularization.  Normal vitals and basic labs with no leukocytosis, signs of sepsis or significant metabolic derangements.  X-ray without clear features of osteomyelitis.  Will start antibiotics and consult medicine for admission.  Vascular surgery NP is already seen the patient and they are planning angiography on Friday.  Clinical Course as of 02/28/24 1722  Wed Feb 28, 2024  1716 Consult hospitalist agrees to admit. [DS]    Clinical Course User Index [DS] Arline Bennett, MD     FINAL CLINICAL IMPRESSION(S) / ED DIAGNOSES   Final diagnoses:  Right foot pain     Rx / DC Orders   ED Discharge Orders     None        Note:  This document was prepared using Dragon voice recognition software and may include unintentional dictation errors.   Arline Bennett, MD 02/28/24 [phone]

## 2024-02-28 NOTE — Consult Note (Signed)
 Hospital Consult    Reason for Consult: Right foot pain with swelling and gangrene fifth toe Requesting Physician: Dr. Anell Baptist, MD MRN #:  [id number]  History of Present Illness: This is a 73 y.o. male  with medical history significant of CAD, CHF, diabetes, hypertension, stroke, peripheral vascular disease.  He presents to the ER after being sent in from the podiatry office for gangrene on 4th and 5th toe.  Patient states that he has been having some pain for a while in his feet and can hardly stand on it.  He noticed some blackness on his toe for about a week.  He had an amputation of his second toe back on March 30.  No fever but has had some chills.  Podiatry has seen him and recommends a Vascular Surgery evaluation prior to any podiatry intervention.   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)    Pneumonia 07/05/2023   admitted to [facility]   Polyneuropathy    S/P CABG x 5 2017   Sepsis (HCC) 07/05/2023   Severe nonproliferative diabetic retinopathy of left eye (HCC)    Stroke (HCC) 2014   mini, tingling right hand    Past Surgical History:  Procedure Laterality Date   AMPUTATION TOE Right 01/21/2024   Procedure: AMPUTATION, TOE;  Surgeon: Anell Baptist, DPM;  Location: [facility] ORS;  Service: Orthopedics/Podiatry;  Laterality: Right;   CARDIAC CATHETERIZATION Left 08/09/2016   Procedure: Left Heart Cath and Coronary Angiography;  Surgeon: Ronney Cola, MD;  Location: [facility];  Service: Cardiovascular;   Laterality: Left;   CAROTID PTA/STENT INTERVENTION Left 09/28/2023   Procedure: CAROTID PTA/STENT INTERVENTION;  Surgeon: Jackquelyn Mass, MD;  Location: [facility];  Service: Cardiovascular;  Laterality: Left;   CATARACT EXTRACTION W/PHACO Right 02/06/2023   Procedure: CATARACT EXTRACTION PHACO AND INTRAOCULAR LENS PLACEMENT (IOC) AHMED TUBE SHUNT W/ TUTOPLAST RIGHT DIABETIC  3.94   00:42.6;  Surgeon: Rosa College, MD;  Location: [facility];  Service: Ophthalmology;  Laterality: Right;   COLONOSCOPY WITH PROPOFOL  N/A 02/02/2021   Procedure: COLONOSCOPY WITH PROPOFOL ;  Surgeon: Shane Darling, MD;  Location: [facility];  Service: Endoscopy;  Laterality: N/A;   CORONARY ARTERY BYPASS GRAFT  2017   x5   IR CHOLANGIOGRAM EXISTING TUBE  12/11/2023   IR EXCHANGE BILIARY DRAIN  09/01/2023   IR EXCHANGE BILIARY DRAIN  11/17/2023   IR PERC CHOLECYSTOSTOMY  07/28/2023   IR RADIOLOGIST EVAL & MGMT  11/17/2023   IRRIGATION AND DEBRIDEMENT FOOT Right 01/21/2024   Procedure: IRRIGATION AND DEBRIDEMENT FOOT;  Surgeon: Anell Baptist, DPM;  Location: [facility];  Service: Orthopedics/Podiatry;  Laterality: Right;   LOWER EXTREMITY ANGIOGRAPHY Right 01/19/2024   Procedure: Lower Extremity Angiography;  Surgeon: Jackquelyn Mass, MD;  Location: [facility];  Service: Cardiovascular;  Laterality: Right;    No Known Allergies  Prior to Admission medications  Medication Sig Start Date End Date Taking? Authorizing Provider  acetaminophen  (TYLENOL ) 500 MG tablet Take 1,000 mg by mouth every 6 (six) hours as needed for mild pain or headache.    [provider]  aspirin  EC 81 MG tablet Take 1 tablet (81 mg total) by mouth daily. 09/29/23 09/28/24  Althia Atlas, MD  atorvastatin  (LIPITOR ) 80 MG tablet Take 1 tablet (80 mg total) by mouth daily. Hold until you see your doctor and have your liver enzymes repeated 07/30/23   Luna Salinas, MD  clopidogrel  (PLAVIX ) 75 MG  tablet Take 1 tablet (75 mg total) by mouth daily. 09/30/23 03/28/24  Althia Atlas, MD  feeding supplement (ENSURE ENLIVE / ENSURE PLUS) LIQD Take 237 mLs by mouth 2 (two) times daily between meals. Patient not taking: Reported on 02/15/2024 07/30/23   Amin, Sumayya, MD  HYDROcodone -acetaminophen  (NORCO/VICODIN) 5-325 MG tablet Take 1-2 tablets by mouth every 6 (six) hours as needed for moderate pain (pain score 4-6) or severe pain (pain score 7-10). 01/17/24   [provider]  JARDIANCE  25 MG TABS tablet Take 25 mg by mouth daily. 12/27/23   [provider]  LANTUS  SOLOSTAR 100 UNIT/ML Solostar Pen Inject 38 Units into the skin at bedtime. 07/14/23   [provider]  OZEMPIC, 2 MG/DOSE, 8 MG/3ML SOPN Inject 2 mg into the skin every 7 (seven) days. Sundays    [provider]  sacubitril -valsartan  (ENTRESTO ) 24-26 MG Take 1 tablet by mouth every 12 (twelve) hours. 09/29/23 09/28/24  Althia Atlas, MD  spironolactone  (ALDACTONE ) 25 MG tablet Take 12.5 mg by mouth daily.    [provider]    Social History   Socioeconomic History   Marital status: Married    Spouse name: Not on file   Number of children: Not on file   Years of education: Not on file   Highest education level: Not on file  Occupational History   Not on file  Tobacco Use   Smoking status: Never    Passive exposure: Never   Smokeless tobacco: Never  Vaping Use   Vaping status: Never Used  Substance and Sexual Activity   Alcohol use: No   Drug use: Never   Sexual activity: Yes    Partners: Female  Other Topics Concern   Not on file  Social History Narrative   Not on file   Social Drivers of Health   Financial Resource Strain: Low Risk  (02/28/2024)   Received from [facility]   Overall Financial Resource Strain (CARDIA)    Difficulty of Paying Living Expenses: Not hard at all  Food Insecurity: No Food Insecurity (02/28/2024)   Received from [facility]   Hunger Vital Sign    Worried About Running Out of Food in the Last Year: Never true    Ran Out of Food in the Last Year: Never true  Transportation Needs: No Transportation Needs (02/28/2024)   Received from [facility] - Transportation    In the past 12 months, has lack of transportation kept you from medical appointments or from getting medications?: No    Lack of Transportation (Non-Medical): No  Physical Activity: Not on file  Stress: Not on file  Social Connections: Socially Integrated (01/19/2024)   Social Connection and Isolation Panel [NHANES]    Frequency of Communication with Friends and Family: Three times a week    Frequency of Social Gatherings with Friends and Family: Twice a week  Attends Religious Services: 1 to 4 times per year    Active Member of Clubs or Organizations: Yes    Attends Banker Meetings: 1 to 4 times per year    Marital Status: Married  Catering manager Violence: Not At Risk (01/19/2024)   Humiliation, Afraid, Rape, and Kick questionnaire    Fear of Current or Ex-Partner: No    Emotionally Abused: No    Physically Abused: No    Sexually Abused: No     Family History  Problem Relation Age of Onset   Hypertension Mother    Diabetes Mother    Anuerysm Sister     ROS: Otherwise negative unless mentioned in HPI  Physical Examination  Vitals:   02/28/24 1255  BP: 121/74  Pulse: 76  Resp: 18  Temp: 97.9 F (36.6 C)  SpO2: 100%   Body mass index is 29.43 kg/m.  General:  WDWN in NAD Gait: Not observed HENT: WNL, normocephalic Pulmonary: normal non-labored breathing, without Rales, rhonchi,  wheezing Cardiac: regular, without  Murmurs, rubs or gallops; without carotid bruits Abdomen: Positive bowel sounds throughout, soft, NT/ND, no masses Skin: without rashes Vascular Exam/Pulses: Positive but weak pulses in patient's right foot at the DP/PT.  Redness and cellulitis to the 4 remaining  toes with the fifth toe being gangrenous. Extremities: with ischemic changes, with Gangrene , with cellulitis; with open wounds;  Musculoskeletal: no muscle wasting or atrophy  Neurologic: A&O X 3;  No focal weakness or paresthesias are detected; speech is fluent/normal Psychiatric:  The pt has Normal affect. Lymph:  Unremarkable  CBC    Component Value Date/Time   WBC 7.7 02/28/2024 1301   RBC 4.58 02/28/2024 1301   HGB 14.6 02/28/2024 1301   HCT 43.8 02/28/2024 1301   PLT 255 02/28/2024 1301   MCV 95.6 02/28/2024 1301   MCH 31.9 02/28/2024 1301   MCHC 33.3 02/28/2024 1301   RDW 14.2 02/28/2024 1301   LYMPHSABS 1.9 02/28/2024 1301   MONOABS 0.7 02/28/2024 1301   EOSABS 0.1 02/28/2024 1301   BASOSABS 0.0 02/28/2024 1301    BMET    Component Value Date/Time   NA 136 02/28/2024 1301   K 4.1 02/28/2024 1301   CL 106 02/28/2024 1301   CO2 20 (L) 02/28/2024 1301   GLUCOSE 131 (H) 02/28/2024 1301   BUN 27 (H) 02/28/2024 1301   CREATININE 0.77 02/28/2024 1301   CALCIUM  9.2 02/28/2024 1301   GFRNONAA >60 02/28/2024 1301   GFRAA >60 03/17/2019 1306    COAGS: Lab Results  Component Value Date   INR 1.0 01/17/2024   INR 1.2 07/28/2023   INR 1.3 (H) 07/05/2023     Non-Invasive Vascular Imaging:   EXAM: RIGHT FOOT COMPLETE - 3+ VIEW   COMPARISON:  01/17/2024   FINDINGS: Interval amputation of the second toe at the MTP joint level.   Bandaging along the fourth and fifth toes and separately along the great toe. No bony destructive findings characteristic of osteomyelitis.   Atheromatous vascular calcifications. Plantar calcaneal spur. Dorsal subcutaneous edema along the forefoot.   IMPRESSION: 1. Interval amputation of the second toe at the MTP joint level. 2. No bony destructive findings characteristic of osteomyelitis. 3. Dorsal subcutaneous edema along the forefoot. 4. Atheromatous vascular calcifications. 5. Plantar calcaneal spur.  Statin:  Yes.   Beta  Blocker:  No. Aspirin :  Yes.   ACEI:  No. ARB:  Yes.   CCB use:  No Other antiplatelets/anticoagulants:  Yes.  Plavix  75 mg daily   ASSESSMENT/PLAN: This is a 73 y.o. male who presents to [facility] emergency department with right foot pain and swelling as well as an increased redness and gangrene to his fifth toe.  Patient was last seen by vascular surgery on 01/19/2024 by Dr. Devon Fogo MD.  At that time he had a percutaneous transluminal angioplasty of the right popliteal artery and a percutaneous laser atherectomy with transluminal angioplasty of the anterior tibial artery.  Patient returns to the emergency room with claudication to his right foot.  Vascular surgery recommends the patient undergo a right lower extremity angiogram with possible intervention.  Vascular surgery plans on taking the patient to the vascular lab on 03/01/2024 for right lower extremity angiogram with possible intervention.  I discussed in detail with the patient his wife and daughter at the bedside in the emergency room today the procedure, benefits, risk, and complications.  Patient verbalizes understanding and wishes to proceed.  I answered all the patient's questions today.  Patient will be made n.p.o. at midnight tonight for procedure tomorrow.   -I discussed the case with Dr. Devon Fogo MD and he agrees with the plan.   Annamaria Barrette [facility] 02/28/2024 4:46 PM

## 2024-02-28 NOTE — ED Triage Notes (Signed)
 Pt presents to ED with /co of being sent from Dr. Louvella Royalty office for toe infection. L foot pinky toe and the one next to it. Pt denies fevers. Pt states recent amputation of a toe in March on the same foot. NAD noted.

## 2024-02-28 NOTE — Progress Notes (Signed)
 This patient was sent from my office to the ER with worsening gangrenous changes of his right 4th and 5th toes.  He status post revascularization in late March and second toe amputation by myself.  At this time he has worsening gangrenous changes of his 4th and 5th toes with a nonhealing ulcer on his great toe with worsening cellulitis to his right foot.  Will need vascular evaluation initially and likely multiple lesser toe amputation.

## 2024-02-28 NOTE — Assessment & Plan Note (Addendum)
 Hemoglobin A1c 6.4.  Placed on sliding scale.  Will increase insulin  to 20 units at night.

## 2024-02-28 NOTE — Assessment & Plan Note (Signed)
 On aspirin  Plavix  and Lipitor 

## 2024-02-29 DIAGNOSIS — I5022 Chronic systolic (congestive) heart failure: Secondary | ICD-10-CM | POA: Diagnosis not present

## 2024-02-29 DIAGNOSIS — M79671 Pain in right foot: Secondary | ICD-10-CM | POA: Diagnosis not present

## 2024-02-29 DIAGNOSIS — I998 Other disorder of circulatory system: Secondary | ICD-10-CM | POA: Diagnosis not present

## 2024-02-29 DIAGNOSIS — I96 Gangrene, not elsewhere classified: Secondary | ICD-10-CM | POA: Diagnosis not present

## 2024-02-29 DIAGNOSIS — E1169 Type 2 diabetes mellitus with other specified complication: Secondary | ICD-10-CM | POA: Diagnosis not present

## 2024-02-29 LAB — BASIC METABOLIC PANEL WITH GFR
Anion gap: 11 (ref 5–15)
BUN: 23 mg/dL (ref 8–23)
CO2: 20 mmol/L — ABNORMAL LOW (ref 22–32)
Calcium: 8.7 mg/dL — ABNORMAL LOW (ref 8.9–10.3)
Chloride: 106 mmol/L (ref 98–111)
Creatinine, Ser: 0.76 mg/dL (ref 0.61–1.24)
GFR, Estimated: >60 mL/min (ref >60)
Glucose, Bld: 89 mg/dL (ref 70–99)
Potassium: 3.4 mmol/L — ABNORMAL LOW (ref 3.5–5.1)
Sodium: 137 mmol/L (ref 135–145)

## 2024-02-29 LAB — CBC
HCT: 39.3 % (ref 39.0–52.0)
Hemoglobin: 12.9 g/dL — ABNORMAL LOW (ref 13.0–17.0)
MCH: 31.6 pg (ref 26.0–34.0)
MCHC: 32.8 g/dL (ref 30.0–36.0)
MCV: 96.3 fL (ref 80.0–100.0)
Platelets: 258 10*3/uL (ref 150–400)
RBC: 4.08 MIL/uL — ABNORMAL LOW (ref 4.22–5.81)
RDW: 14.2 % (ref 11.5–15.5)
WBC: 8.8 10*3/uL (ref 4.0–10.5)
nRBC: 0 % (ref 0.0–0.2)

## 2024-02-29 LAB — GLUCOSE, CAPILLARY
Glucose-Capillary: 116 mg/dL — ABNORMAL HIGH (ref 70–99)
Glucose-Capillary: 131 mg/dL — ABNORMAL HIGH (ref 70–99)

## 2024-02-29 LAB — HEPARIN LEVEL (UNFRACTIONATED)
Heparin Unfractionated: 0.49 [IU]/mL (ref 0.30–0.70)
Heparin Unfractionated: 0.39 [IU]/mL (ref 0.30–0.70)

## 2024-02-29 LAB — CBG MONITORING, ED
Glucose-Capillary: 81 mg/dL (ref 70–99)
Glucose-Capillary: 153 mg/dL — ABNORMAL HIGH (ref 70–99)

## 2024-02-29 MED ORDER — INSULIN GLARGINE-YFGN 100 UNIT/ML ~~LOC~~ SOLN
10.0000 [IU] | Freq: Every day | SUBCUTANEOUS | Status: DC
  Administered 2024-02-29 – 2024-03-01 (×2): 10 [IU] via SUBCUTANEOUS
  Filled 2024-02-29 (×3): qty 0.1

## 2024-02-29 MED ORDER — INSULIN GLARGINE-YFGN 100 UNIT/ML ~~LOC~~ SOLN: 15.0000 [IU] | Freq: Every day | SUBCUTANEOUS | Status: DC

## 2024-02-29 NOTE — Plan of Care (Signed)

## 2024-02-29 NOTE — Hospital Course (Addendum)
 73 y.o. male with medical history significant of CAD, CHF, diabetes, hypertension, stroke, peripheral vascular disease.  He presents to the ER after being sent in from the podiatry office for gangrene on 4th and 5th toe.  Patient states that he has been having some pain for a while in his feet and can hardly stand on it.  He noticed some blackness on his toe for about a week.  He had an amputation of his second toe back on March 30.  No fever but has had some chills.  Podiatry sent him for vascular evaluation.   5/8.  Patient for angiogram tomorrow.  Still having pain in toes.  Likely will need amputation and level will depend on angiogram results. 5/9.  Patient seen before angiogram.

## 2024-02-29 NOTE — Consult Note (Signed)
 ORTHOPAEDIC CONSULTATION  REQUESTING PHYSICIAN: Verla Glaze, MD  Chief Complaint: Right foot gangrene and pain  HPI: Alexander [name] is a 73 y.o. male who complains of worsening pain to his right foot.  Patient amount seen in the outpatient clinic.  Underwent second toe amputation approximately 1 month ago.  Healing well from this but unfortunately developed ulcerations to his 4th and 5th toes.  He also has an ulcer on his great toe.  Worsening pain redness send necrotic tissue noted to the fourth fifth toes in the outpatient clinic and sent to the hospital for reevaluation for vascular workup and likely amputation of necrotic tissue.  Today he is feeling somewhat better.  Pain is stable.  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)    Pneumonia 07/05/2023   admitted to [facility]   Polyneuropathy    S/P CABG x 5 2017   Sepsis (HCC) 07/05/2023   Severe nonproliferative diabetic retinopathy of left eye (HCC)    Stroke (HCC) 2014   mini, tingling right hand   Past Surgical History:  Procedure Laterality Date   AMPUTATION TOE Right 01/21/2024   Procedure: AMPUTATION, TOE;  Surgeon: Anell Baptist, DPM;  Location: [facility] OR[facility];  Service: Orthopedics/Podiatry;  Laterality: Right;   CARDIAC CATHETERIZATION Left 08/09/2016   Procedure: Left Heart Cath and Coronary Angiography;  Surgeon: Ronney Cola, MD;  Location: [facility];  Service: Cardiovascular;  Laterality: Left;   CAROTID PTA/STENT INTERVENTION Left 09/28/2023    Procedure: CAROTID PTA/STENT INTERVENTION;  Surgeon: Jackquelyn Mass, MD;  Location: [facility];  Service: Cardiovascular;  Laterality: Left;   CATARACT EXTRACTION W/PHACO Right 02/06/2023   Procedure: CATARACT EXTRACTION PHACO AND INTRAOCULAR LENS PLACEMENT (IOC) AHMED TUBE SHUNT W/ TUTOPLAST RIGHT DIABETIC  3.94   00:42.6;  Surgeon: Rosa College, MD;  Location: [facility];  Service: Ophthalmology;  Laterality: Right;   COLONOSCOPY WITH PROPOFOL  N/A 02/02/2021   Procedure: COLONOSCOPY WITH PROPOFOL ;  Surgeon: Shane Darling, MD;  Location: [facility];  Service: Endoscopy;  Laterality: N/A;   CORONARY ARTERY BYPASS GRAFT  2017   x5   IR CHOLANGIOGRAM EXISTING TUBE  12/11/2023   IR EXCHANGE BILIARY DRAIN  09/01/2023   IR EXCHANGE BILIARY DRAIN  11/17/2023   IR PERC CHOLECYSTOSTOMY  07/28/2023   IR RADIOLOGIST EVAL & MGMT  11/17/2023   IRRIGATION AND DEBRIDEMENT FOOT Right 01/21/2024   Procedure: IRRIGATION AND DEBRIDEMENT FOOT;  Surgeon: Anell Baptist, DPM;  Location: [facility];  Service: Orthopedics/Podiatry;  Laterality: Right;   LOWER EXTREMITY ANGIOGRAPHY Right 01/19/2024   Procedure: Lower Extremity Angiography;  Surgeon: Jackquelyn Mass, MD;  Location: [facility];  Service: Cardiovascular;  Laterality: Right;   Social History   Socioeconomic History   Marital status: Married    Spouse name: Not on file   Number of children: Not on file   Years of education: Not on  file   Highest education level: Not on file  Occupational History   Not on file  Tobacco Use   Smoking status: Never    Passive exposure: Never   Smokeless tobacco: Never  Vaping Use   Vaping status: Never Used  Substance and Sexual Activity   Alcohol use: No   Drug use: Never   Sexual activity: Yes    Partners: Female  Other Topics Concern   Not on file  Social History Narrative   Not on file   Social Drivers of Health   Financial Resource Strain: Low Risk   (02/28/2024)   Received from [facility]   Overall Financial Resource Strain (CARDIA)    Difficulty of Paying Living Expenses: Not hard at all  Food Insecurity: No Food Insecurity (02/28/2024)   Hunger Vital Sign    Worried About Running Out of Food in the Last Year: Never true    Ran Out of Food in the Last Year: Never true  Transportation Needs: No Transportation Needs (02/28/2024)   PRAPARE - Administrator, Civil Service (Medical): No    Lack of Transportation (Non-Medical): No  Physical Activity: Not on file  Stress: Not on file  Social Connections: Socially Integrated (02/28/2024)   Social Connection and Isolation Panel [NHANES]    Frequency of Communication with Friends and Family: Three times a week    Frequency of Social Gatherings with Friends and Family: Twice a week    Attends Religious Services: 1 to 4 times per year    Active Member of [employer] or Organizations: Yes    Attends Banker Meetings: 1 to 4 times per year    Marital Status: Married   Family History  Problem Relation Age of Onset   Hypertension Mother    Diabetes Mother    Cirrhosis Father    Anuerysm Sister    CAD Brother    Cancer Brother    No Known Allergies Prior to Admission medications   Medication Sig Start Date End Date Taking? Authorizing Provider  acetaminophen  (TYLENOL ) 500 MG tablet Take 1,000 mg by mouth every 6 (six) hours as needed for mild pain or headache.   Yes [provider]  aspirin  EC 81 MG tablet Take 1 tablet (81 mg total) by mouth daily. 09/29/23 09/28/24 Yes Althia Atlas, MD  atorvastatin  (LIPITOR ) 80 MG tablet Take 1 tablet (80 mg total) by mouth daily. Hold until you see your doctor and have your liver enzymes repeated 07/30/23  Yes Luna Salinas, MD  clopidogrel  (PLAVIX ) 75 MG tablet Take 1 tablet (75 mg total) by mouth daily. 09/30/23 03/28/24 Yes Althia Atlas, MD  JARDIANCE 25 MG TABS tablet Take 25 mg by mouth daily. 12/27/23  Yes  [provider]  LANTUS  SOLOSTAR 100 UNIT/ML Solostar Pen Inject 38 Units into the skin at bedtime. 07/14/23  Yes [provider]  OZEMPIC, 2 MG/DOSE, 8 MG/3ML SOPN Inject 2 mg into the skin every 7 (seven) days. Sundays   Yes [provider]  sacubitril -valsartan  (ENTRESTO ) 24-26 MG Take 1 tablet by mouth every 12 (twelve) hours. 09/29/23 09/28/24 Yes Althia Atlas, MD  spironolactone (ALDACTONE) 25 MG tablet Take 12.5 mg by mouth daily.   Yes [provider]   DG Foot Complete Right Result Date: 02/28/2024 CLINICAL DATA:  Toe pain EXAM: RIGHT FOOT COMPLETE - 3+ VIEW COMPARISON:  01/17/2024 FINDINGS: Interval amputation of the second toe at the MTP joint level. Bandaging along the fourth and  fifth toes and separately along the great toe. No bony destructive findings characteristic of osteomyelitis. Atheromatous vascular calcifications. Plantar calcaneal spur. Dorsal subcutaneous edema along the forefoot. IMPRESSION: 1. Interval amputation of the second toe at the MTP joint level. 2. No bony destructive findings characteristic of osteomyelitis. 3. Dorsal subcutaneous edema along the forefoot. 4. Atheromatous vascular calcifications. 5. Plantar calcaneal spur. Electronically Signed   By: Freida Jes M.D.   On: 02/28/2024 14:42    Positive ROS: All other systems have been reviewed and were otherwise negative with the exception of those mentioned in the HPI and as above.  12 point ROS was performed.  Physical Exam: General: Alert and oriented.  No apparent distress.  Vascular:  Left foot:Dorsalis Pedis:  absent Posterior Tibial:  absent  Right foot: Dorsalis Pedis:  absent Posterior Tibial:  absent  Neuro:intact gross sensation.  Derm: The distal aspect of the fifth toe has marked necrosis.  The distal lateral aspect of the fourth toe also with marked necrosis.  Surrounding erythema to the lateral 4th and 5th rays are noted.  Somewhat improved from  outpatient clinic evaluation.  Also full-thickness ulcer to the distal medial aspect of the great toe.  This is dried today.  Ortho/MS: He is status post second toe amputation and this has healed nicely.  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  He had some mild to moderate cellulitis with worsening pain.  Vascular surgery is planning to reevaluate the lower extremity and possible revascularization as appropriate.  After vascular procedure has been performed we will plan for likely amputation of the 4th and 5th toes.  We discussed today amputating the third and great toe given the fact he has a full-thickness ulcer on the distal medial aspect of the great toe that has not responded to conservative care.  At this point right now he is still hesitant to undergo any further amputation then the necrotic 4th and 5th toes.  He would like to preserve as much tissue as possible.  We discussed if the great toe does not heal or becomes infected will likely need amputation down the road.  Would also consider third toe amputation at the same time if needed.  Will follow-up tomorrow for final consent and plan for likely surgery on Saturday morning.    Brant Caldron, DPM Cell [phone]   02/29/2024 1:41 PM

## 2024-02-29 NOTE — Progress Notes (Signed)
 PHARMACY - ANTICOAGULATION CONSULT NOTE  Pharmacy Consult for Heparin  Infusion Indication: Ischemic Foot   No Known Allergies  Patient Measurements: Height: 6' (182.9 cm) Weight: 98.4 kg (217 lb) IBW/kg (Calculated) : 77.6 HEPARIN  DW (KG): 97.4  Vital Signs: Temp: 98.2 F (36.8 C) (05/08 0320) Temp Source: Oral (05/08 0320) BP: 109/62 (05/08 0320) Pulse Rate: 79 (05/08 0320)  Labs: Recent Labs    02/28/24 1301 02/28/24 1831 02/29/24 0324  HGB 14.6  --  12.9*  HCT 43.8  --  39.3  PLT 255  --  258  APTT  --  33  --   LABPROT  --  13.8  --   INR  --  1.0  --   HEPARINUNFRC  --   --  0.49  CREATININE 0.77  --  0.76    Estimated Creatinine Clearance: 101.4 mL/min (by C-G formula based on SCr of 0.76 mg/dL).   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)    Pneumonia 07/05/2023   admitted to [facility]   Polyneuropathy    S/P CABG x 5 2017   Sepsis (HCC) 07/05/2023   Severe nonproliferative diabetic retinopathy of left eye (HCC)    Stroke (HCC) 2014   mini, tingling right hand    Medications:  Scheduled:   aspirin  EC  81 mg Oral Daily   atorvastatin   80 mg Oral Daily   clopidogrel   75 mg Oral Daily   empagliflozin  25 mg Oral Daily   insulin  aspart  0-5 Units Subcutaneous QHS   insulin  aspart  0-9 Units Subcutaneous TID WC   insulin  glargine-yfgn  36 Units Subcutaneous QHS   sacubitril -valsartan   1 tablet Oral Q12H   spironolactone  12.5 mg Oral Daily   Infusions:   heparin  1,400  Units/hr (02/28/24 2250)   piperacillin -tazobactam (ZOSYN )  IV Stopped (02/29/24 0103)   vancomycin       Assessment: 73 yo male sent from podiatry office for gangrene on 4th and 5th toe. He has a PMH of CAD, Stroke, PVD, DM and CHF. Pharmacy consulted for heparin  dosing and monitoring for ischemic foot. Vascular consulted and planning angiogram on Friday   Goal of Therapy:  Heparin  level 0.3-0.7 units/ml Monitor platelets by anticoagulation protocol: Yes   Plan:  5/8:  HL @ 0324 = 0.49, therapeutic X 1 - Will continue pt on current rate and recheck HL in 8 hrs.  Continue to monitor H&H and platelets. CBC ordered daily per protocol.  Alvenia Job, PharmD Clinical Pharmacist 02/29/2024 4:00 AM

## 2024-02-29 NOTE — Progress Notes (Signed)
 PHARMACY - ANTICOAGULATION CONSULT NOTE  Pharmacy Consult for Heparin  Infusion Indication: Ischemic Foot   No Known Allergies  Patient Measurements: Height: 6' (182.9 cm) Weight: 98.4 kg (217 lb) IBW/kg (Calculated) : 77.6 HEPARIN  DW (KG): 97.4  Vital Signs: Temp: 98.3 F (36.8 C) (05/08 0901) Temp Source: Oral (05/08 0901) BP: 105/50 (05/08 1127) Pulse Rate: 69 (05/08 1127)  Labs: Recent Labs    02/28/24 1301 02/28/24 1831 02/29/24 0324 02/29/24 1109  HGB 14.6  --  12.9*  --   HCT 43.8  --  39.3  --   PLT 255  --  258  --   APTT  --  33  --   --   LABPROT  --  13.8  --   --   INR  --  1.0  --   --   HEPARINUNFRC  --   --  0.49 0.39  CREATININE 0.77  --  0.76  --     Estimated Creatinine Clearance: 101.4 mL/min (by C-G formula based on SCr of 0.76 mg/dL).   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)    Pneumonia 07/05/2023   admitted to [facility]   Polyneuropathy    S/P CABG x 5 2017   Sepsis (HCC) 07/05/2023   Severe nonproliferative diabetic retinopathy of left eye (HCC)    Stroke (HCC) 2014   mini, tingling right hand   Medications:  Scheduled:   aspirin  EC  81 mg Oral Daily   atorvastatin   80 mg Oral Daily   clopidogrel   75 mg Oral Daily   empagliflozin  25 mg Oral Daily   insulin  aspart  0-5 Units Subcutaneous QHS   insulin  aspart  0-9 Units Subcutaneous TID WC   insulin  glargine-yfgn  10 Units Subcutaneous QHS   sacubitril -valsartan   1 tablet Oral Q12H   spironolactone   12.5 mg Oral Daily   Infusions:   heparin  1,400 Units/hr (02/29/24 0908)   piperacillin -tazobactam (ZOSYN )  IV Stopped (02/29/24 0805)   vancomycin  Stopped (02/29/24 1610)   Assessment: 73 yo male sent from podiatry office for gangrene on 4th and 5th toe. He has a PMH of CAD, Stroke, PVD, DM and CHF. Pharmacy consulted for heparin  dosing and monitoring for ischemic foot. Vascular consulted and planning angiogram on Friday.  Date Time HL Rate/Comment 05/08 0324 0.49 Therapeutic x1 on 1400 units/hour 05/08 1109 0.39 Therapeutic x2  Goal of Therapy:  Heparin  level 0.3-0.7 units/ml Monitor platelets by anticoagulation protocol: Yes   Plan:  Heparin  level remains therapeutic x2 on 1400 units/hour Continue heparin  infusion at 1400 units/hour Check heparin  level in AM Monitor daily heparin  levels while on heparin  infusion Monitor CBC and signs/symptoms of bleeding  Thank you for involving pharmacy in this patient's care.   Ananias Balls, PharmD Clinical Pharmacist 02/29/2024 11:36 AM

## 2024-02-29 NOTE — Progress Notes (Signed)
  Progress Note    02/29/2024 11:56 AM * No surgery found *  Subjective: [name] is a 73 year old male who presented to [facility] emergency department yesterday for right foot swelling and pain.  He was also noted to have gangrene to his right fifth toe.  He had an amputation of his second toe on his right foot back on March 30.  He continues to have ulcerations to his great toe on his right foot.  Review of Systems  Constitutional:  Constitutional negative. Eyes: Eyes negative.  Respiratory: Respiratory negative.  Cardiovascular: Cardiovascular negative.  GI: Gastrointestinal negative.  GU: Genitourinary negative. Skin: Positive for wound.  Neurological: Neurological negative. Psychiatric: Psychiatric negative.  All other systems reviewed and are negative     Vitals:   02/29/24 0901 02/29/24 1127  BP:  (!) 105/50  Pulse:  69  Resp:  13  Temp: 98.3 F (36.8 C)   SpO2:  95%   Physical Exam: Cardiac:  RRR, normal S1 and S2.  No rubs clicks gallops or murmurs noted. Lungs: Clear on auscultation throughout, normal labored breathing.  No rales rhonchi or wheezing noted. Incisions: None Extremities: Bilateral upper extremities warm to touch with palpable pulses.  Bilateral lower extremities right lower leg with cellulitis and gangrene to the fifth toe post second toe amputation.  Ulceration to right great toe.  Left lower extremity with +2 edema.  Not able to palpate Abdomen: Positive bowel sounds throughout, soft, nontender nondistended. Neurologic: Alert and oriented x 4, follows commands answers questions appropriately.  CBC    Component Value Date/Time   WBC 8.8 02/29/2024 0324   RBC 4.08 (L) 02/29/2024 0324   HGB 12.9 (L) 02/29/2024 0324   HCT 39.3 02/29/2024 0324   PLT 258 02/29/2024 0324   MCV 96.3 02/29/2024 0324   MCH 31.6 02/29/2024 0324   MCHC 32.8 02/29/2024 0324   RDW 14.2 02/29/2024 0324   LYMPHSABS 1.9 02/28/2024 1301   MONOABS 0.7 02/28/2024 1301    EOSABS 0.1 02/28/2024 1301   BASOSABS 0.0 02/28/2024 1301    BMET    Component Value Date/Time   NA 137 02/29/2024 0324   K 3.4 (L) 02/29/2024 0324   CL 106 02/29/2024 0324   CO2 20 (L) 02/29/2024 0324   GLUCOSE 89 02/29/2024 0324   BUN 23 02/29/2024 0324   CREATININE 0.76 02/29/2024 0324   CALCIUM  8.7 (L) 02/29/2024 0324   GFRNONAA >60 02/29/2024 0324   GFRAA >60 03/17/2019 1306    INR    Component Value Date/Time   INR 1.0 02/28/2024 1831     Intake/Output Summary (Last 24 hours) at 02/29/2024 1156 Last data filed at 02/29/2024 1005 Gross per 24 hour  Intake 100 ml  Output 1275 ml  Net -1175 ml     Assessment/Plan:  73 y.o. male who presents to [facility] emergency department with right lower extremity pain and swelling cellulitis and gangrene to his fifth toe.* No surgery found *   Vascular surgery plans to take him to the vascular lab tomorrow on 03/01/2024 for right lower extremity angiogram with possible intervention.  I spoke to the patient again today about the procedure, benefits, risk, complications.  He wishes to proceed.  Patient will be made n.p.o. after midnight tonight.  DVT prophylaxis: Heparin  infusion.   Annamaria Barrette [facility] 02/29/2024 11:56 AM

## 2024-02-29 NOTE — Progress Notes (Signed)
 Progress Note   Patient: Alexander [name] WUJ:[id number] DOB: October 21, 1951 DOA: 02/28/2024     1 DOS: the patient was seen and examined on 02/29/2024   Brief hospital course: 73 y.o. male with medical history significant of CAD, CHF, diabetes, hypertension, stroke, peripheral vascular disease.  He presents to the ER after being sent in from the podiatry office for gangrene on 4th and 5th toe.  Patient states that he has been having some pain for a while in his feet and can hardly stand on it.  He noticed some blackness on his toe for about a week.  He had an amputation of his second toe back on March 30.  No fever but has had some chills.  Podiatry sent him for vascular evaluation.   5/8.  Patient for angiogram tomorrow.  Still having pain in toes.  Likely will need amputation and level will depend on angiogram results.  Assessment and Plan: * Ischemic pain of right foot Vascular team planning on angiogram tomorrow.  Patient on aspirin  Plavix  and Lipitor .  Continue heparin  drip.  Gangrene (HCC) Gangrene on the lateral side 4th toe right foot and more circumferential on the fifth toe of the right foot today.  Patient also has an ulcer on the first toe.  Blood cultures ordered by ER physician.  wound culture ordered.  Started on empiric Zosyn  and vancomycin .  Type 2 diabetes mellitus with hyperlipidemia (HCC) Continue Lantus  insulin  and Jardiance and hold Ozempic.  Hemoglobin A1c 6.4.  Placed on sliding scale.  Decrease long-acting insulin  dose for tonight because he will be n.p.o. for tomorrow.  Overweight (BMI 25.0-29.9) BMI 29.43 with current height and weight in computer  History of CAD (coronary artery disease) On aspirin  Plavix  and Lipitor   Chronic systolic CHF (congestive heart failure) (HCC) EF 30 to 35%.  Continue Jardiance, Entresto  and Aldactone.        Subjective: Patient feels about the same.  Having pain in his tenderness.  Having some drainage from his toes.  Fifth toe looks  more circumferential black today.  Sent in by podiatry for gangrene toes.  Physical Exam: Vitals:   02/29/24 0320 02/29/24 0400 02/29/24 0800 02/29/24 0901  BP: 109/62 113/65 124/70   Pulse: 79 74 (!) 59   Resp: 17 16 14    Temp: 98.2 F (36.8 C)   98.3 F (36.8 C)  TempSrc: Oral   Oral  SpO2: 94% 94% 95%   Weight:      Height:       Physical Exam HENT:     Head: Normocephalic.     Mouth/Throat:     Pharynx: No oropharyngeal exudate.  Eyes:     General: Lids are normal.     Conjunctiva/sclera: Conjunctivae normal.  Cardiovascular:     Rate and Rhythm: Normal rate and regular rhythm.     Heart sounds: Normal heart sounds, S1 normal and S2 normal.  Pulmonary:     Breath sounds: No decreased breath sounds, wheezing, rhonchi or rales.  Abdominal:     Palpations: Abdomen is soft.     Tenderness: There is no abdominal tenderness.  Musculoskeletal:     Right lower leg: No swelling.     Left lower leg: Swelling present.  Skin:    General: Skin is warm.     Comments: Gangrene on the lateral 4th toe right foot and circumferential gangrene fifth toe right foot..  Neurological:     Mental Status: He is alert and oriented to person, place,  and time.     Data Reviewed: Creatinine 0.76, potassium 3.4, hemoglobin 12.9, white blood cell count 8.8, hemoglobin 258  Family Communication: Updated wife on the phone  Disposition: Status is: Inpatient Remains inpatient appropriate because: Patient will have angiogram tomorrow and likely be set up for amputation prior to disposition.  Planned Discharge Destination: Home    Time spent: 28 minutes  Author: Verla Glaze, MD 02/29/2024 11:01 AM  For on call review [email or web address].

## 2024-03-01 ENCOUNTER — Encounter
Admission: EM | Disposition: A | Discharge status: Home Health | Payer: Self-pay | Source: Home / Self Care | Attending: Internal Medicine

## 2024-03-01 DIAGNOSIS — I700 Atherosclerosis of aorta: Secondary | ICD-10-CM

## 2024-03-01 DIAGNOSIS — M79671 Pain in right foot: Secondary | ICD-10-CM | POA: Diagnosis not present

## 2024-03-01 DIAGNOSIS — I96 Gangrene, not elsewhere classified: Secondary | ICD-10-CM | POA: Diagnosis not present

## 2024-03-01 DIAGNOSIS — E663 Overweight: Secondary | ICD-10-CM | POA: Diagnosis not present

## 2024-03-01 DIAGNOSIS — I70261 Atherosclerosis of native arteries of extremities with gangrene, right leg: Principal | ICD-10-CM

## 2024-03-01 DIAGNOSIS — E1169 Type 2 diabetes mellitus with other specified complication: Secondary | ICD-10-CM | POA: Diagnosis not present

## 2024-03-01 HISTORY — PX: LOWER EXTREMITY ANGIOGRAPHY: CATH118251

## 2024-03-01 LAB — BASIC METABOLIC PANEL WITH GFR
Anion gap: 13 (ref 5–15)
BUN: 24 mg/dL — ABNORMAL HIGH (ref 8–23)
CO2: 20 mmol/L — ABNORMAL LOW (ref 22–32)
Calcium: 8.6 mg/dL — ABNORMAL LOW (ref 8.9–10.3)
Chloride: 104 mmol/L (ref 98–111)
Creatinine, Ser: 0.66 mg/dL (ref 0.61–1.24)
GFR, Estimated: >60 mL/min (ref >60)
Glucose, Bld: 104 mg/dL — ABNORMAL HIGH (ref 70–99)
Potassium: 3.6 mmol/L (ref 3.5–5.1)
Sodium: 137 mmol/L (ref 135–145)

## 2024-03-01 LAB — GLUCOSE, CAPILLARY
Glucose-Capillary: 109 mg/dL — ABNORMAL HIGH (ref 70–99)
Glucose-Capillary: 124 mg/dL — ABNORMAL HIGH (ref 70–99)
Glucose-Capillary: 80 mg/dL (ref 70–99)
Glucose-Capillary: 68 mg/dL — ABNORMAL LOW (ref 70–99)
Glucose-Capillary: 77 mg/dL (ref 70–99)
Glucose-Capillary: 120 mg/dL — ABNORMAL HIGH (ref 70–99)

## 2024-03-01 LAB — CBC
HCT: 40.3 % (ref 39.0–52.0)
Hemoglobin: 13.8 g/dL (ref 13.0–17.0)
MCH: 31.7 pg (ref 26.0–34.0)
MCHC: 34.2 g/dL (ref 30.0–36.0)
MCV: 92.6 fL (ref 80.0–100.0)
Platelets: 237 10*3/uL (ref 150–400)
RBC: 4.35 MIL/uL (ref 4.22–5.81)
RDW: 14 % (ref 11.5–15.5)
WBC: 6.3 10*3/uL (ref 4.0–10.5)
nRBC: 0 % (ref 0.0–0.2)

## 2024-03-01 LAB — HEPARIN LEVEL (UNFRACTIONATED): Heparin Unfractionated: 0.31 [IU]/mL (ref 0.30–0.70)

## 2024-03-01 SURGERY — LOWER EXTREMITY ANGIOGRAPHY
Anesthesia: Moderate Sedation | Laterality: Right

## 2024-03-01 MED ORDER — SODIUM CHLORIDE 0.9% FLUSH
3.0000 mL | Freq: Two times a day (BID) | INTRAVENOUS | Status: DC
  Administered 2024-03-02 – 2024-03-03 (×2): 3 mL via INTRAVENOUS

## 2024-03-01 MED ORDER — SODIUM CHLORIDE 0.9% FLUSH: 3.0000 mL | INTRAVENOUS | Status: DC | PRN

## 2024-03-01 MED ORDER — CHLORHEXIDINE GLUCONATE 4 % EX SOLN
60.0000 mL | Freq: Once | CUTANEOUS | Status: AC
  Administered 2024-03-01: 4 via TOPICAL

## 2024-03-01 MED ORDER — MIDAZOLAM HCL 2 MG/ML PO SYRP
8.0000 mg | ORAL_SOLUTION | Freq: Once | ORAL | Status: DC | PRN
  Filled 2024-03-01: qty 5

## 2024-03-01 MED ORDER — HEPARIN SODIUM (PORCINE) 1000 UNIT/ML IJ SOLN
INTRAMUSCULAR | Status: AC
  Filled 2024-03-01: qty 10

## 2024-03-01 MED ORDER — CEFAZOLIN SODIUM-DEXTROSE 2-4 GM/100ML-% IV SOLN
INTRAVENOUS | Status: AC
  Filled 2024-03-01: qty 100

## 2024-03-01 MED ORDER — SODIUM CHLORIDE 0.9 % IV SOLN: 250.0000 mL | INTRAVENOUS | Status: AC | PRN

## 2024-03-01 MED ORDER — MIDAZOLAM HCL 5 MG/5ML IJ SOLN
INTRAMUSCULAR | Status: AC
  Filled 2024-03-01: qty 5

## 2024-03-01 MED ORDER — IODIXANOL 320 MG/ML IV SOLN
INTRAVENOUS | Status: DC | PRN
  Administered 2024-03-01: 115 mL via INTRA_ARTERIAL

## 2024-03-01 MED ORDER — HEPARIN (PORCINE) IN NACL 2000-0.9 UNIT/L-% IV SOLN
INTRAVENOUS | Status: DC | PRN
  Administered 2024-03-01: 1000 mL

## 2024-03-01 MED ORDER — SODIUM CHLORIDE 0.9 % IV SOLN
1.0000 g | INTRAVENOUS | Status: DC
  Administered 2024-03-02: 1 g via INTRAVENOUS
  Filled 2024-03-01 (×3): qty 10

## 2024-03-01 MED ORDER — HEPARIN SODIUM (PORCINE) 1000 UNIT/ML IJ SOLN
INTRAMUSCULAR | Status: DC | PRN
  Administered 2024-03-01: 5000 [IU] via INTRAVENOUS

## 2024-03-01 MED ORDER — SODIUM CHLORIDE 0.9 % IV SOLN: INTRAVENOUS | Status: DC

## 2024-03-01 MED ORDER — MIDAZOLAM HCL 2 MG/2ML IJ SOLN
INTRAMUSCULAR | Status: DC | PRN
  Administered 2024-03-01: 2 mg via INTRAVENOUS
  Administered 2024-03-01: 1 mg via INTRAVENOUS

## 2024-03-01 MED ORDER — FENTANYL CITRATE (PF) 100 MCG/2ML IJ SOLN
INTRAMUSCULAR | Status: AC
  Filled 2024-03-01: qty 2

## 2024-03-01 MED ORDER — FENTANYL CITRATE (PF) 100 MCG/2ML IJ SOLN
INTRAMUSCULAR | Status: DC | PRN
  Administered 2024-03-01: 50 ug via INTRAVENOUS
  Administered 2024-03-01: 25 ug via INTRAVENOUS

## 2024-03-01 MED ORDER — CEFAZOLIN SODIUM-DEXTROSE 2-4 GM/100ML-% IV SOLN
2.0000 g | INTRAVENOUS | Status: AC
  Administered 2024-03-01: 2 g via INTRAVENOUS

## 2024-03-01 MED ORDER — DIPHENHYDRAMINE HCL 50 MG/ML IJ SOLN: 50.0000 mg | Freq: Once | INTRAMUSCULAR | Status: DC | PRN

## 2024-03-01 MED ORDER — LIDOCAINE HCL (PF) 1 % IJ SOLN
INTRAMUSCULAR | Status: DC | PRN
  Administered 2024-03-01: 10 mL

## 2024-03-01 MED ORDER — FAMOTIDINE 20 MG PO TABS: 40.0000 mg | ORAL_TABLET | Freq: Once | ORAL | Status: DC | PRN

## 2024-03-01 MED ORDER — METHYLPREDNISOLONE SODIUM SUCC 125 MG IJ SOLR
125.0000 mg | Freq: Once | INTRAMUSCULAR | Status: DC | PRN
Start: 2024-03-01 — End: 2024-03-01

## 2024-03-01 MED ORDER — SODIUM CHLORIDE 0.9 % IV SOLN
INTRAVENOUS | Status: AC
Start: 2024-03-01 — End: 2024-03-01

## 2024-03-01 MED ORDER — POVIDONE-IODINE 10 % EX SWAB: 2.0000 | Freq: Once | CUTANEOUS | Status: DC

## 2024-03-01 SURGICAL SUPPLY — 29 items
BALLOON ARMADA 1.5X40X150 (BALLOONS) IMPLANT
BALLOON JADE .014 3.5 X 40 (BALLOONS) IMPLANT
BALLOON LUTONIX DCB 6X100X130 (BALLOONS) IMPLANT
BALLOON ULTRVRSE 3X150X150 (BALLOONS) IMPLANT
CATH ANGIO 5F PIGTAIL 65CM (CATHETERS) IMPLANT
CATH BEACON 5 .035 65 KMP TIP (CATHETERS) IMPLANT
CATH CXI SUPP 2.6F 150 ANG (CATHETERS) IMPLANT
CATH TEMPO 5F RIM 65CM (CATHETERS) IMPLANT
CATH VERT 5FR 125CM (CATHETERS) IMPLANT
COVER PROBE ULTRASOUND 5X96 (MISCELLANEOUS) IMPLANT
DEVICE PRESTO INFLATION (MISCELLANEOUS) IMPLANT
DEVICE STARCLOSE SE CLOSURE (Vascular Products) IMPLANT
GLIDECATH 4FR STR (CATHETERS) IMPLANT
GLIDEWIRE ADV .035X260CM (WIRE) IMPLANT
GLIDEWIRE ANGLED SS 035X260CM (WIRE) IMPLANT
GOWN STRL REUS W/ TWL LRG LVL3 (GOWN DISPOSABLE) ×1 IMPLANT
GUIDEWIRE ANGLED .035 180CM (WIRE) IMPLANT
NDL ENTRY 21GA 7CM ECHOTIP (NEEDLE) IMPLANT
NEEDLE ENTRY 21GA 7CM ECHOTIP (NEEDLE) ×1 IMPLANT
PACK ANGIOGRAPHY (CUSTOM PROCEDURE TRAY) ×1 IMPLANT
SET INTRO CAPELLA COAXIAL (SET/KITS/TRAYS/PACK) IMPLANT
SHEATH BRITE TIP 5FRX11 (SHEATH) IMPLANT
SHEATH RAABE 6FR (SHEATH) IMPLANT
STENT ESPRIT BTK 3.5X38 SCAFF (Permanent Stent) IMPLANT
STENT LIFESTENT 5F 6X100X135 (Permanent Stent) IMPLANT
SYR MEDRAD MARK 7 150ML (SYRINGE) IMPLANT
TUBING CONTRAST HIGH PRESS 72 (TUBING) IMPLANT
WIRE COMMAND ST 018 300CM (WIRE) IMPLANT
WIRE J 3MM .035X145CM (WIRE) IMPLANT

## 2024-03-01 NOTE — Care Management Important Message (Signed)
 Important Message  Patient Details  Name: Alexander [name] MRN: [id number] Date of Birth: 07-26-1951   Important Message Given:  Yes - Medicare IM     Anise Kerns 03/01/2024, 2:11 PM

## 2024-03-01 NOTE — Progress Notes (Signed)
 Daily Progress Note   Subjective  - * No surgery found *  Right 4th and 5th toe gangrene.  Scheduled for angio today for revascularization.  States pain has improved.  Objective Vitals:   02/29/24 1425 02/29/24 2019 03/01/24 0344 03/01/24 0802  BP: 108/85 (!) 105/47 114/71 118/61  Pulse: 72 70 69 69  Resp: 18 20 20 16   Temp: 98 F (36.7 C) (!) 97.3 F (36.3 C) 97.6 F (36.4 C) (!) 97.5 F (36.4 C)  TempSrc: Oral     SpO2: 99% 99% 98% 99%  Weight:      Height:        Physical Exam: Necrotic fourth toe laterally at the PIPJ with drainage.  Distal fifth toe with necrosis.  Unfortunately developed a pressure induced ulceration laterally on the fifth MTPJ that is concerning for worsening wound at this time.  He does have a dry ulceration on the distal medial aspect of the great toe.  No surrounding erythema to the great toe.  Previous second toe amputation site has healed.  Third toe without issue.  Laboratory CBC    Component Value Date/Time   WBC 6.3 03/01/2024 0550   HGB 13.8 03/01/2024 0550   HCT 40.3 03/01/2024 0550   PLT 237 03/01/2024 0550    BMET    Component Value Date/Time   NA 137 03/01/2024 0550   K 3.6 03/01/2024 0550   CL 104 03/01/2024 0550   CO2 20 (L) 03/01/2024 0550   GLUCOSE 104 (H) 03/01/2024 0550   BUN 24 (H) 03/01/2024 0550   CREATININE 0.66 03/01/2024 0550   CALCIUM  8.6 (L) 03/01/2024 0550   GFRNONAA >60 03/01/2024 0550   GFRAA >60 03/17/2019 1306    Assessment/Planning: Peripheral vascular disease with gangrene right 4th and 5th toes Diabetes  Plan for surgical invention tomorrow.  We discussed different options in regards to the gangrenous changes.  Amputation of the 4th and 5th toe but likely removal of the distal fifth metatarsal head will be performed given the fact he has this pressure induced ulcerative change laterally at the fifth MTPJ. We discussed the great toe ulcerative site that stable at this time.  Certainly consideration  for amputation of the distal great toe could be performed at this time but patient is willing to continue with local wound care and reevaluation. Orders have been placed for surgery tomorrow.  Verbal consent was provided today.  Anell Baptist A  03/01/2024, 12:22 PM

## 2024-03-01 NOTE — Progress Notes (Signed)
**Note Alexander-Identified via Obfuscation**  Pharmacy Antibiotic Note  Alexander [name] is a 73 y.o. male admitted on 02/28/2024 with wound infection of toe. Patient was sent from outpatient podiatry office to ED for gangrene of 4th and 5th toe. He has PMH of DM, PVD, CAD, and CHF. Pharmacy has been consulted for vancomycin  dosing.   Plan:continue vancomycin  1500 mg IV Q 12 hrs.  Goal AUC 400-550. Expected AUC: 526 SCr used: 0.8 mg/dL (rounded up) Vd: 1.61  Start Zosyn  3.375 IV EI every 8 hours   Height: 6' (182.9 cm) Weight: 98.4 kg (217 lb) IBW/kg (Calculated) : 77.6  Temp (24hrs), Avg:97.9 F (36.6 C), Min:97.3 F (36.3 C), Max:98.3 F (36.8 C)  Recent Labs  Lab 02/28/24 1301 02/29/24 0324 03/01/24 0550  WBC 7.7 8.8 6.3  CREATININE 0.77 0.76 0.66    Estimated Creatinine Clearance: 101.4 mL/min (by C-G formula based on SCr of 0.66 mg/dL).    No Known Allergies  Antimicrobials this admission: 5/7 Zosyn   >> 5/9 5/9 ceftriaxone  >> 5/7 vancomycin   >>   Microbiology results: 5/7 BCx: NGTD 5/7 Wound Cx: pending  Thank you for allowing pharmacy to be a part of this patient's care.  Alexander [name], PharmD, BCPS Clinical Pharmacist 03/01/2024 7:28 AM

## 2024-03-01 NOTE — Progress Notes (Signed)
 PHARMACY - ANTICOAGULATION CONSULT NOTE  Pharmacy Consult for Heparin  Infusion Indication: Ischemic Foot   No Known Allergies  Patient Measurements: Height: 6' (182.9 cm) Weight: 98.4 kg (217 lb) IBW/kg (Calculated) : 77.6 HEPARIN  DW (KG): 97.4  Vital Signs: Temp: 97.6 F (36.4 C) (05/09 0344) BP: 114/71 (05/09 0344) Pulse Rate: 69 (05/09 0344)  Labs: Recent Labs    02/28/24 1301 02/28/24 1831 02/29/24 0324 02/29/24 1109 03/01/24 0550  HGB 14.6  --  12.9*  --  13.8  HCT 43.8  --  39.3  --  40.3  PLT 255  --  258  --  237  APTT  --  33  --   --   --   LABPROT  --  13.8  --   --   --   INR  --  1.0  --   --   --   HEPARINUNFRC  --   --  0.49 0.39 0.31  CREATININE 0.77  --  0.76  --  0.66    Estimated Creatinine Clearance: 101.4 mL/min (by C-G formula based on SCr of 0.66 mg/dL).   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)    Pneumonia 07/05/2023   admitted to [facility]   Polyneuropathy    S/P CABG x 5 2017   Sepsis (HCC) 07/05/2023   Severe nonproliferative diabetic retinopathy of left eye (HCC)    Stroke (HCC) 2014   mini, tingling right hand   Medications:  Scheduled:   aspirin  EC  81 mg Oral Daily   atorvastatin   80 mg Oral Daily   clopidogrel   75 mg Oral Daily   empagliflozin   25 mg Oral Daily   insulin  aspart  0-5 Units Subcutaneous QHS   insulin  aspart  0-9 Units Subcutaneous TID WC   insulin  glargine-yfgn  10 Units Subcutaneous QHS   sacubitril -valsartan   1 tablet Oral Q12H    spironolactone   12.5 mg Oral Daily   Infusions:   heparin  1,400 Units/hr (03/01/24 0124)   piperacillin -tazobactam (ZOSYN )  IV 3.375 g (03/01/24 0531)   vancomycin  1,500 mg (03/01/24 0530)   Assessment: 73 yo male sent from podiatry office for gangrene on 4th and 5th toe. He has a PMH of CAD, Stroke, PVD, DM and CHF. Pharmacy consulted for heparin  dosing and monitoring for ischemic foot. Vascular consulted and planning angiogram on Friday.  Date Time HL Rate/Comment 05/08 0324 0.49 Therapeutic x1 on 1400 units/hour 05/08 1109 0.39 Therapeutic x2  Goal of Therapy:  Heparin  level 0.3-0.7 units/ml Monitor platelets by anticoagulation protocol: Yes   Plan:  ---Heparin  level remains therapeutic but trend is down and level is borderline therapeutic ---increase heparin  infusion to 1500 units/hour ---recheck  heparin  level in 8 hours after rate change ---Monitor daily heparin  levels while on heparin  infusion ---Monitor CBC and signs/symptoms of bleeding  Thank you for involving pharmacy in this patient's care.   Barney Boozer, PharmD,  BCPS Clinical Pharmacist 03/01/2024 7:25 AM

## 2024-03-01 NOTE — Progress Notes (Signed)
 Progress Note   Patient: Alexander [name] WJX:[id number] DOB: 28-Jul-1951 DOA: 02/28/2024     2 DOS: the patient was seen and examined on 03/01/2024   Brief hospital course: 73 y.o. male with medical history significant of CAD, CHF, diabetes, hypertension, stroke, peripheral vascular disease.  He presents to the ER after being sent in from the podiatry office for gangrene on 4th and 5th toe.  Patient states that he has been having some pain for a while in his feet and can hardly stand on it.  He noticed some blackness on his toe for about a week.  He had an amputation of his second toe back on March 30.  No fever but has had some chills.  Podiatry sent him for vascular evaluation.   5/8.  Patient for angiogram tomorrow.  Still having pain in toes.  Likely will need amputation and level will depend on angiogram results. 5/9.  Patient seen before angiogram.  Assessment and Plan: * Ischemic pain of right foot Vascular team to perform angiogram on 5/9.  Patient on aspirin  Plavix  and Lipitor .  Continue heparin  drip.  Gangrene (HCC) Gangrene on the lateral side 4th toe right foot and more circumferential on the fifth toe of the right foot today.  Patient also has an ulcer on the first toe.  Blood cultures negative for 2 days.  Zosyn  switched over to Rocephin .  Continue vancomycin .  Type 2 diabetes mellitus with hyperlipidemia (HCC) Hemoglobin A1c 6.4.  Placed on sliding scale.  Patient had lower dose insulin  last night because he is n.p.o. today.  Patient on sliding scale.  Overweight (BMI 25.0-29.9) BMI 29.43 with current height and weight in computer  History of CAD (coronary artery disease) On aspirin  Plavix  and Lipitor   Chronic systolic CHF (congestive heart failure) (HCC) EF 30 to 35%.  Continue Jardiance , Entresto  and Aldactone .        Subjective: Patient still has some pain on his foot.  Admitted with 4th and 5th toe gangrene.  Physical Exam: Vitals:   03/01/24 1642 03/01/24 1644  03/01/24 1646 03/01/24 1648  BP: (!) 141/76 139/74 (!) 145/75 (!) 143/72  Pulse: 75 73 74 75  Resp: 12 15 12 11   Temp:      TempSrc:      SpO2: 99% 98% 100% 100%  Weight:      Height:       Physical Exam HENT:     Head: Normocephalic.     Mouth/Throat:     Pharynx: No oropharyngeal exudate.  Eyes:     General: Lids are normal.     Conjunctiva/sclera: Conjunctivae normal.  Cardiovascular:     Rate and Rhythm: Normal rate and regular rhythm.     Heart sounds: Normal heart sounds, S1 normal and S2 normal.  Pulmonary:     Breath sounds: No decreased breath sounds, wheezing, rhonchi or rales.  Abdominal:     Palpations: Abdomen is soft.     Tenderness: There is no abdominal tenderness.  Musculoskeletal:     Right lower leg: No swelling.     Left lower leg: Swelling present.  Skin:    General: Skin is warm.     Comments: Gangrene on the lateral 4th toe right foot and circumferential gangrene fifth toe right foot..  Neurological:     Mental Status: He is alert and oriented to person, place, and time.     Data Reviewed: Blood cultures negative for 2 days, creatinine 0.66, potassium 3.6, CBC normal range  Family Communication: Spoke with wife yesterday about plan  Disposition: Status is: Inpatient Remains inpatient appropriate because: Angiogram today and likely will need amputation  Planned Discharge Destination: Home    Time spent: 28 minutes  Author: Verla Glaze, MD 03/01/2024 5:00 PM  For on call review [email or web address].

## 2024-03-01 NOTE — Plan of Care (Signed)
  Problem: Education: Goal: Ability to describe self-care measures that may prevent or decrease complications (Diabetes Survival Skills Education) will improve Outcome: Progressing Goal: Individualized Educational Video(s) Outcome: Progressing   Problem: Coping: Goal: Ability to adjust to condition or change in health will improve Outcome: Progressing   Problem: Fluid Volume: Goal: Ability to maintain a balanced intake and output will improve Outcome: Progressing   Problem: Health Behavior/Discharge Planning: Goal: Ability to identify and utilize available resources and services will improve Outcome: Progressing Goal: Ability to manage health-related needs will improve Outcome: Progressing   Problem: Metabolic: Goal: Ability to maintain appropriate glucose levels will improve Outcome: Progressing   Problem: Nutritional: Goal: Maintenance of adequate nutrition will improve Outcome: Progressing Goal: Progress toward achieving an optimal weight will improve Outcome: Progressing   Problem: Tissue Perfusion: Goal: Adequacy of tissue perfusion will improve Outcome: Progressing   Problem: Skin Integrity: Goal: Risk for impaired skin integrity will decrease Outcome: Progressing   Problem: Education: Goal: Knowledge of General Education information will improve Description: Including pain rating scale, medication(s)/side effects and non-pharmacologic comfort measures Outcome: Progressing   Problem: Health Behavior/Discharge Planning: Goal: Ability to manage health-related needs will improve Outcome: Progressing   Problem: Clinical Measurements: Goal: Ability to maintain clinical measurements within normal limits will improve Outcome: Progressing Goal: Will remain free from infection Outcome: Progressing Goal: Diagnostic test results will improve Outcome: Progressing Goal: Respiratory complications will improve Outcome: Progressing Goal: Cardiovascular complication will  be avoided Outcome: Progressing

## 2024-03-01 NOTE — Op Note (Signed)
 [facility]  Percutaneous Study/Intervention Procedural Note   Date of Surgery: 03/01/2024  Surgeon:  Devon Fogo  Pre-operative Diagnosis: Atherosclerotic occlusive disease bilateral lower extremities with right lower extremity with rest pain and ulceration.  Post-operative diagnosis:  Same  Procedure(s) Performed:             1.  Introduction catheter into right lower extremity 3rd order catheter placement               2.    Contrast injection right lower extremity for distal runoff             3.  Percutaneous transluminal angioplasty and stent placement right superficial femoral and popliteal arteries to 6 mm             4.  Percutaneous transluminal angioplasty and stent placement right anterior tibial to 3.5 mm with Esprit stents             5.  Star close closure left common femoral arteriotomy  Anesthesia: Conscious sedation was administered under my direct supervision by the interventional radiology RN. IV Versed  plus fentanyl  were utilized. Continuous ECG, pulse oximetry and blood pressure was monitored throughout the entire procedure.  Conscious sedation was for a total of 103.  Sheath: 6 [place] Rabie left common femoral retrograde  Contrast: 115 cc  Fluoroscopy Time: 20.9 minutes  Indications:  Alexander [name] presents with increasing pain of the right forefoot associated with worsening gangrenous changes of the toes.  This suggests the patient is having limb threatening ischemia. The risks and benefits are reviewed all questions answered patient agrees to proceed.  Procedure:   Alexander [name] is a 73 y.o. y.o. male who was identified and appropriate procedural time out was performed.  The patient was then placed supine on the table and prepped and draped in the usual sterile fashion.    Ultrasound was placed in the sterile sleeve and the left groin was evaluated the left common femoral artery was echolucent and pulsatile indicating patency.  Image  was recorded for the permanent record and under real-time visualization a microneedle was inserted into the common femoral artery followed by the microwire and then the micro-sheath.  A J-wire was then advanced through the micro-sheath and a  5 [place] sheath was then inserted over a J-wire. J-wire was then advanced and a 5 French pigtail catheter was positioned at the level of T12.  AP projection of the aorta was then obtained. Pigtail catheter was repositioned to above the bifurcation and a LAO view of the pelvis was obtained.  Due to the steep nature of his bifurcation much like a church staple is quite difficult to get across the bifurcation and down into the superficial femoral.  A combination of stiff angled glide wire, floppy Glidewire, advantage wire, pigtail catheter, rim catheter and Kumpe catheter as well as a 4 French glide catheter were all used.  Ultimately the floppy Glidewire followed by the glide catheter was able to get the wire down far enough that the Kumpe catheter would track and then the stiff angled Glidewire tract.  This allowed flow for placement of the Rabie sheath which was positioned with its tip in the SFA.   6000 units of heparin  was then given and allowed to circulate for several minutes.    KMP catheter and advantage Glidewire were then negotiated down into the distal popliteal. Catheter was then advanced. Hand injection contrast demonstrated the tibial anatomy in further detail.  Again given the occlusion of the anterior tibial it was difficult to get through this area but ultimately using the 125 Kumpe with a 018 command as well as the advantage wire I was able to get through and then a CXI catheter was advanced hand-injection contrast verified intraluminal placement within the mid anterior tibial and verified distal runoff.  A 0.018 wire was then advanced through the CXI catheter.  The detector was then repositioned to the distal SFA and above-knee popliteal.  There were  tandem lesions of greater than 70% noted 1 in the distal SFA and 1 in the proximal popliteal.  A 6 mm x 100 mm life stent was then deployed across the lesion postdilated with a 6 mm x 100 mm Lutonix drug-eluting balloon inflated to 10 atm for 1 minute.  Follow-up imaging demonstrated wide patency with less than 10% residual stenosis and attention was returned to the anterior tibial artery occlusion.  Working over the 018 wire a 3 mm x 150 mm Ultraverse balloon was advanced across these occluded segment inflated to 10 atm for 1 minute.  Follow-up imaging demonstrated patency but greater than 70% stenosis at multiple areas including the ostia.  A 3.5 mm x 40 mm jade balloon was then advanced across these areas and inflated to 10 atm for 30 seconds.  A 3.5 mm x 38 mm Esprit stent was deployed followed by a second 3.5 mm x 38 mm Esprit stent extending up to the ostia of the anterior tibial it actually extended approximately 2 mm into the popliteal artery (much like a renal extending slightly into the aorta).  These 2 were then postdilated with the 3.5 x 40 Jade balloon and again 2 inflations were made to 18 atm.  Follow-up imaging demonstrated wide patency of the anterior tibial artery with less than 10% residual stenosis.  There is a short segment occlusion of the dorsalis pedis overlying the proximal tarsal heads.  I attempted to advance a 1.5 mm balloon on a 150 cm shaft but the balloon did not reach the lesion and we do not have any balloons with a shaft length longer than 150.  However there are numerous collaterals and there is rapid filling of the pedal arch which is a marked improvement over the preintervention.  After review of these images the sheath is pulled into the left external iliac oblique of the common femoral is obtained and a Star close device deployed. There no immediate Complications.  Findings:  The abdominal aorta is opacified with a bolus injection contrast. Renal arteries are single and  widely patent without evidence of hemodynamically significant stenosis.  The aorta itself has diffuse disease but no hemodynamically significant lesions. The common and external iliac arteries are widely patent bilaterally.  The right common femoral is widely patent as is the profunda femoris.  The SFA does indeed have a significant stenosis 2 lesions are noted 1 in the distal SFA 1 in the above-knee popliteal and these are of 70% the popliteal is otherwise patent down to the trifurcation.  The anterior tibial is occluded in its proximal 5 cm there is a 60% smooth lesion in its midportion it is otherwise patent down to the foot filling the dorsalis pedis but there is a lesion in the distal dorsalis pedis.  The tibioperoneal trunk is patent but then occludes prior to the bifurcation of the peroneal and posterior tibial.  Peroneal and posterior tibial are occluded throughout the course and there is no significant reconstitution distally.  Following angioplasty and stent placement of the SFA and above-knee popliteal there is wide patency with less than 10% residual stenosis.  Following angioplasty and stent placement of the anterior tibial there is now wide patency of the anterior tibial all the way down to the foot and now numerous collaterals are also seen and there is rapid filling of the pedal arch.  Summary: Successful recanalization right lower extremity for limb salvage                           Disposition: Patient was taken to the recovery room in stable condition having tolerated the procedure well.  Alexander [name] 03/01/2024,4:59 PM

## 2024-03-02 ENCOUNTER — Other Ambulatory Visit: Payer: Self-pay

## 2024-03-02 ENCOUNTER — Encounter: Payer: Self-pay | Admitting: Internal Medicine

## 2024-03-02 ENCOUNTER — Inpatient Hospital Stay: Admitting: Anesthesiology

## 2024-03-02 ENCOUNTER — Encounter
Admission: EM | Disposition: A | Discharge status: Home Health | Payer: Self-pay | Source: Home / Self Care | Attending: Internal Medicine

## 2024-03-02 DIAGNOSIS — E1169 Type 2 diabetes mellitus with other specified complication: Secondary | ICD-10-CM | POA: Diagnosis not present

## 2024-03-02 DIAGNOSIS — E663 Overweight: Secondary | ICD-10-CM | POA: Diagnosis not present

## 2024-03-02 DIAGNOSIS — I96 Gangrene, not elsewhere classified: Secondary | ICD-10-CM | POA: Diagnosis not present

## 2024-03-02 DIAGNOSIS — M79671 Pain in right foot: Secondary | ICD-10-CM | POA: Diagnosis not present

## 2024-03-02 HISTORY — PX: AMPUTATION TOE: SHX6595

## 2024-03-02 LAB — BASIC METABOLIC PANEL WITH GFR
Anion gap: 7 (ref 5–15)
BUN: 18 mg/dL (ref 8–23)
CO2: 21 mmol/L — ABNORMAL LOW (ref 22–32)
Calcium: 8.6 mg/dL — ABNORMAL LOW (ref 8.9–10.3)
Chloride: 109 mmol/L (ref 98–111)
Creatinine, Ser: 0.74 mg/dL (ref 0.61–1.24)
GFR, Estimated: >60 mL/min (ref >60)
Glucose, Bld: 102 mg/dL — ABNORMAL HIGH (ref 70–99)
Potassium: 3.7 mmol/L (ref 3.5–5.1)
Sodium: 137 mmol/L (ref 135–145)

## 2024-03-02 LAB — CBC
HCT: 39.1 % (ref 39.0–52.0)
Hemoglobin: 13.5 g/dL (ref 13.0–17.0)
MCH: 32.1 pg (ref 26.0–34.0)
MCHC: 34.5 g/dL (ref 30.0–36.0)
MCV: 93.1 fL (ref 80.0–100.0)
Platelets: 226 10*3/uL (ref 150–400)
RBC: 4.2 MIL/uL — ABNORMAL LOW (ref 4.22–5.81)
RDW: 13.8 % (ref 11.5–15.5)
WBC: 7.1 10*3/uL (ref 4.0–10.5)
nRBC: 0 % (ref 0.0–0.2)

## 2024-03-02 LAB — GLUCOSE, CAPILLARY
Glucose-Capillary: 114 mg/dL — ABNORMAL HIGH (ref 70–99)
Glucose-Capillary: 88 mg/dL (ref 70–99)
Glucose-Capillary: 191 mg/dL — ABNORMAL HIGH (ref 70–99)
Glucose-Capillary: 198 mg/dL — ABNORMAL HIGH (ref 70–99)

## 2024-03-02 LAB — MRSA NEXT GEN BY PCR, NASAL: MRSA by PCR Next Gen: NOT DETECTED

## 2024-03-02 LAB — HEPARIN LEVEL (UNFRACTIONATED): Heparin Unfractionated: 0.4 [IU]/mL (ref 0.30–0.70)

## 2024-03-02 SURGERY — AMPUTATION, TOE
Anesthesia: General | Site: Toe | Laterality: Right

## 2024-03-02 MED ORDER — 0.9 % SODIUM CHLORIDE (POUR BTL) OPTIME
TOPICAL | Status: DC | PRN
  Administered 2024-03-02: 500 mL

## 2024-03-02 MED ORDER — FENTANYL CITRATE (PF) 100 MCG/2ML IJ SOLN
INTRAMUSCULAR | Status: AC
  Filled 2024-03-02: qty 2

## 2024-03-02 MED ORDER — EPHEDRINE SULFATE-NACL 50-0.9 MG/10ML-% IV SOSY
PREFILLED_SYRINGE | INTRAVENOUS | Status: DC | PRN
  Administered 2024-03-02: 15 mg via INTRAVENOUS
  Administered 2024-03-02: 10 mg via INTRAVENOUS

## 2024-03-02 MED ORDER — SUCCINYLCHOLINE CHLORIDE 200 MG/10ML IV SOSY
PREFILLED_SYRINGE | INTRAVENOUS | Status: AC
  Filled 2024-03-02: qty 10

## 2024-03-02 MED ORDER — FENTANYL CITRATE (PF) 100 MCG/2ML IJ SOLN
INTRAMUSCULAR | Status: DC | PRN
  Administered 2024-03-02 (×2): 50 ug via INTRAVENOUS

## 2024-03-02 MED ORDER — EPHEDRINE 5 MG/ML INJ
INTRAVENOUS | Status: AC
  Filled 2024-03-02: qty 5

## 2024-03-02 MED ORDER — PROPOFOL 10 MG/ML IV BOLUS
INTRAVENOUS | Status: DC | PRN
  Administered 2024-03-02: 100 mg via INTRAVENOUS

## 2024-03-02 MED ORDER — INSULIN GLARGINE-YFGN 100 UNIT/ML ~~LOC~~ SOLN
20.0000 [IU] | Freq: Every day | SUBCUTANEOUS | Status: DC
  Administered 2024-03-02: 20 [IU] via SUBCUTANEOUS
  Filled 2024-03-02 (×2): qty 0.2

## 2024-03-02 MED ORDER — GLYCOPYRROLATE 0.2 MG/ML IJ SOLN
INTRAMUSCULAR | Status: DC | PRN
Start: 2024-03-02 — End: 2024-03-02
  Administered 2024-03-02: .2 mg via INTRAVENOUS

## 2024-03-02 MED ORDER — DEXAMETHASONE SODIUM PHOSPHATE 10 MG/ML IJ SOLN
INTRAMUSCULAR | Status: DC | PRN
  Administered 2024-03-02: 10 mg via INTRAVENOUS

## 2024-03-02 MED ORDER — GLYCOPYRROLATE 0.2 MG/ML IJ SOLN
INTRAMUSCULAR | Status: AC
  Filled 2024-03-02: qty 1

## 2024-03-02 MED ORDER — MIDAZOLAM HCL 2 MG/2ML IJ SOLN
INTRAMUSCULAR | Status: AC
  Filled 2024-03-02: qty 2

## 2024-03-02 MED ORDER — LIDOCAINE HCL (CARDIAC) PF 100 MG/5ML IV SOSY
PREFILLED_SYRINGE | INTRAVENOUS | Status: DC | PRN
  Administered 2024-03-02: 50 mg via INTRAVENOUS

## 2024-03-02 MED ORDER — ONDANSETRON HCL 4 MG/2ML IJ SOLN
INTRAMUSCULAR | Status: DC | PRN
  Administered 2024-03-02: 4 mg via INTRAVENOUS

## 2024-03-02 MED ORDER — ONDANSETRON HCL 4 MG/2ML IJ SOLN
INTRAMUSCULAR | Status: AC
  Filled 2024-03-02: qty 2

## 2024-03-02 MED ORDER — DEXAMETHASONE SODIUM PHOSPHATE 10 MG/ML IJ SOLN
INTRAMUSCULAR | Status: AC
  Filled 2024-03-02: qty 1

## 2024-03-02 MED ORDER — BUPIVACAINE HCL 0.5 % IJ SOLN
INTRAMUSCULAR | Status: DC | PRN
  Administered 2024-03-02: 20 mL

## 2024-03-02 MED ORDER — MIDAZOLAM HCL 2 MG/2ML IJ SOLN
INTRAMUSCULAR | Status: DC | PRN
  Administered 2024-03-02: 2 mg via INTRAVENOUS

## 2024-03-02 MED ORDER — SUCCINYLCHOLINE CHLORIDE 200 MG/10ML IV SOSY
PREFILLED_SYRINGE | INTRAVENOUS | Status: DC | PRN
  Administered 2024-03-02: 100 mg via INTRAVENOUS

## 2024-03-02 MED ORDER — PHENYLEPHRINE 80 MCG/ML (10ML) SYRINGE FOR IV PUSH (FOR BLOOD PRESSURE SUPPORT)
PREFILLED_SYRINGE | INTRAVENOUS | Status: AC
  Filled 2024-03-02: qty 10

## 2024-03-02 MED ORDER — PHENYLEPHRINE 80 MCG/ML (10ML) SYRINGE FOR IV PUSH (FOR BLOOD PRESSURE SUPPORT)
PREFILLED_SYRINGE | INTRAVENOUS | Status: DC | PRN
  Administered 2024-03-02: 80 ug via INTRAVENOUS
  Administered 2024-03-02: 160 ug via INTRAVENOUS
  Administered 2024-03-02: 80 ug via INTRAVENOUS

## 2024-03-02 SURGICAL SUPPLY — 37 items
BLADE OSC/SAGITTAL MD 5.5X18 (BLADE) ×1 IMPLANT
BNDG ELASTIC 4X5.8 VLCR NS LF (GAUZE/BANDAGES/DRESSINGS) ×1 IMPLANT
BNDG GAUZE DERMACEA FLUFF 4 (GAUZE/BANDAGES/DRESSINGS) ×1 IMPLANT
BNDG STRETCH GAUZE 3IN X12FT (GAUZE/BANDAGES/DRESSINGS) ×2 IMPLANT
DRSG MEPILEX FLEX 3X3 (GAUZE/BANDAGES/DRESSINGS) IMPLANT
DRSG MEPILEX HEEL 8.7X9.1 (GAUZE/BANDAGES/DRESSINGS) IMPLANT
DURAPREP 26ML APPLICATOR (WOUND CARE) ×1 IMPLANT
ELECTRODE REM PT RTRN 9FT ADLT (ELECTROSURGICAL) ×1 IMPLANT
GAUZE SPONGE 4X4 12PLY STRL (GAUZE/BANDAGES/DRESSINGS) ×1 IMPLANT
GAUZE XEROFORM 1X8 LF (GAUZE/BANDAGES/DRESSINGS) ×1 IMPLANT
GLOVE BIO SURGEON STRL SZ7.5 (GLOVE) ×1 IMPLANT
GLOVE INDICATOR 8.0 STRL GRN (GLOVE) ×1 IMPLANT
GOWN STRL REUS W/ TWL XL LVL3 (GOWN DISPOSABLE) ×1 IMPLANT
GOWN STRL REUS W/TWL XL LVL4 (GOWN DISPOSABLE) ×1 IMPLANT
KIT TURNOVER KIT A (KITS) ×1 IMPLANT
LABEL OR SOLS (LABEL) ×1 IMPLANT
MANIFOLD NEPTUNE II (INSTRUMENTS) ×1 IMPLANT
NDL FILTER BLUNT 18X1 1/2 (NEEDLE) ×1 IMPLANT
NDL HYPO 25X1 1.5 SAFETY (NEEDLE) ×1 IMPLANT
NEEDLE FILTER BLUNT 18X1 1/2 (NEEDLE) ×1 IMPLANT
NEEDLE HYPO 25X1 1.5 SAFETY (NEEDLE) ×1 IMPLANT
NS IRRIG 500ML POUR BTL (IV SOLUTION) ×1 IMPLANT
PACK EXTREMITY ARMC (MISCELLANEOUS) ×1 IMPLANT
PAD ABD DERMACEA PRESS 5X9 (GAUZE/BANDAGES/DRESSINGS) ×2 IMPLANT
PENCIL SMOKE EVACUATOR (MISCELLANEOUS) ×1 IMPLANT
SHIELD FULL FACE ANTIFOG 7M (MISCELLANEOUS) ×1 IMPLANT
SOL .9 NS 3000ML IRR UROMATIC (IV SOLUTION) ×1 IMPLANT
STOCKINETTE IMPERV 14X48 (MISCELLANEOUS) ×1 IMPLANT
STOCKINETTE M/LG 89821 (MISCELLANEOUS) ×1 IMPLANT
STRAP SAFETY 5IN WIDE (MISCELLANEOUS) ×1 IMPLANT
SUT ETHILON 5-0 FS-2 18 BLK (SUTURE) ×1 IMPLANT
SUT VIC AB 3-0 SH 27X BRD (SUTURE) IMPLANT
SUTURE EHLN 3-0 FS-10 30 BLK (SUTURE) ×1 IMPLANT
SYR 10ML LL (SYRINGE) ×3 IMPLANT
TIP FAN IRRIG PULSAVAC PLUS (DISPOSABLE) ×1 IMPLANT
TRAP FLUID SMOKE EVACUATOR (MISCELLANEOUS) ×1 IMPLANT
WATER STERILE IRR 500ML POUR (IV SOLUTION) ×1 IMPLANT

## 2024-03-02 NOTE — Op Note (Signed)
 Operative note   Surgeon:Mariene Dickerman [employer] forces logistics/support/administrative officer: None    Preop diagnosis: 1.  Gangrene right fourth toe 2.  Gangrene right fifth toe and metatarsal    Postop diagnosis: Same    Procedure: 1.  Amputation right fourth toe MTPJ 2.  Amputation right fifth ray    EBL: Minimal    Anesthesia:local and general.  Local consists of a total of 20 cc of 0.5% bupivacaine  infiltrated in a local block fashion    Hemostasis: None    Specimen: 4th and 5th toe and ray for pathology and deep wound culture for wound culture    Complications: None    Operative indications:Alexander [name] is an 73 y.o. that presents today for surgical intervention.  The risks/benefits/alternatives/complications have been discussed and consent has been given.    Procedure:  Patient was brought into the OR and placed on the operating table in thesupine position. After anesthesia was obtained theright lower extremity was prepped and draped in usual sterile fashion.  Attention was directed to the right fourth fifth toes where noted gangrenous changes to the lateral fourth toe entire fifth toe and full-thickness ulcer with fluctuance of soft tissue at the fifth MTPJ.  Full-thickness flap was created along the lateral fifth metatarsal encompassing the fifth toe and further progressing around the fourth toe at the MTPJ region.  The fourth toe was disarticulated at the MTPJ.  The fifth ray was incised and dissected away to at the level of the surgical neck.  Next an osteotomy was created just proximal to the surgical neck of the fifth metatarsal.  This was then excised and removed from the surgical field in toto.  The wound was flushed with copious amounts of irrigation.  Good perfusion of the skin flaps were noted.  Mild bleeding was noted in a large bulky padded dressing was applied.    Patient tolerated the procedure and anesthesia well.  Was transported from the OR to the PACU with all vital signs stable and vascular status  intact. To be discharged per routine protocol.  Will follow up in approximately 1 week in the outpatient clinic.

## 2024-03-02 NOTE — Transfer of Care (Signed)
 Immediate Anesthesia Transfer of Care Note  Patient: Alexander [name]  Procedure(s) Performed: AMPUTATION, TOE (Right: Toe)  Patient Location: PACU  Anesthesia Type:General  Level of Consciousness: awake  Airway & Oxygen Therapy: Patient Spontanous Breathing and Patient connected to face mask oxygen  Post-op Assessment: Report given to RN and Post -op Vital signs reviewed and stable  Post vital signs: Reviewed  Last Vitals:  Vitals Value Taken Time  BP 118/66 03/02/24 1226  Temp    Pulse 89 03/02/24 1227  Resp 16 03/02/24 1227  SpO2 100 % 03/02/24 1227  Vitals shown include unfiled device data.  Last Pain:  Vitals:   03/02/24 0415  TempSrc:   PainSc: Asleep         Complications: No notable events documented.

## 2024-03-02 NOTE — Anesthesia Procedure Notes (Signed)
 Procedure Name: Intubation Date/Time: 03/02/2024 11:44 AM  Performed by: Kavin Parsley, CRNAPre-anesthesia Checklist: Patient identified, Patient being monitored, Timeout performed, Emergency Drugs available and Suction available Patient Re-evaluated:Patient Re-evaluated prior to induction Oxygen Delivery Method: Circle system utilized Preoxygenation: Pre-oxygenation with 100% oxygen Induction Type: IV induction and Rapid sequence Laryngoscope Size: McGrath and 4 Grade View: Grade I Tube type: Oral Tube size: 7.5 mm Number of attempts: 1 Airway Equipment and Method: Stylet and Video-laryngoscopy Placement Confirmation: ETT inserted through vocal cords under direct vision, positive ETCO2 and breath sounds checked- equal and bilateral Secured at: 22 cm Tube secured with: Tape Dental Injury: Teeth and Oropharynx as per pre-operative assessment

## 2024-03-02 NOTE — Anesthesia Postprocedure Evaluation (Signed)
 Anesthesia Post Note  Patient: Alexander [name]  Procedure(s) Performed: AMPUTATION, TOE (Right: Toe)  Patient location during evaluation: PACU Anesthesia Type: General Level of consciousness: awake and alert Pain management: pain level controlled Vital Signs Assessment: post-procedure vital signs reviewed and stable Respiratory status: spontaneous breathing, nonlabored ventilation, respiratory function stable and patient connected to nasal cannula oxygen Cardiovascular status: blood pressure returned to baseline and stable Postop Assessment: no apparent nausea or vomiting Anesthetic complications: no   No notable events documented.   Last Vitals:  Vitals:   03/02/24 1334 03/02/24 1400  BP: (!) 95/51 103/63  Pulse: 75 77  Resp:    Temp:  37.2 C  SpO2: 97% 99%    Last Pain:  Vitals:   03/02/24 1400  TempSrc: Oral  PainSc: 0-No pain                 Vanice Genre

## 2024-03-02 NOTE — Progress Notes (Signed)
 Progress Note   Patient: Alexander [name]:[id number] DOB: 03-12-1951 DOA: 02/28/2024     3 DOS: the patient was seen and examined on 03/02/2024   Brief hospital course: 73 y.o. male with medical history significant of CAD, CHF, diabetes, hypertension, stroke, peripheral vascular disease.  He presents to the ER after being sent in from the podiatry office for gangrene on 4th and 5th toe.  Patient states that he has been having some pain for a while in his feet and can hardly stand on it.  He noticed some blackness on his toe for about a week.  He had an amputation of his second toe back on March 30.  No fever but has had some chills.  Podiatry sent him for vascular evaluation.   5/8.  Patient for angiogram tomorrow.  Still having pain in toes.  Likely will need amputation and level will depend on angiogram results. 5/9.  Angiogram with angioplasty and stent placement of right superficial femoral and popliteal arteries and also anterior tibial artery. 5/10.  Amputation of right fourth toe and PIPJ and right fifth ray.  Assessment and Plan: * Ischemic pain of right foot Vascular team performed angiogram and had angioplasties and stents placed to get better blood supply to right foot..  Patient on aspirin  Plavix  and Lipitor .  Vascular surgery discontinued heparin  drip.  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  Pain control.  Type 2 diabetes mellitus with hyperlipidemia (HCC) Hemoglobin A1c 6.4.  Placed on sliding scale.  Will increase insulin  to 20 units at night.  Overweight (BMI 25.0-29.9) BMI 29.43 with current height and weight in computer  History of CAD (coronary artery disease) On aspirin  Plavix  and Lipitor   Chronic systolic CHF (congestive heart failure) (HCC) EF 30 to 35%.  Continue Jardiance , Entresto  and Aldactone .        Subjective: Patient seen prior to amputation today.  Feels okay.  Admitted with gangrenous  toes.  Physical Exam: Vitals:   03/02/24 1245 03/02/24 1300 03/02/24 1334 03/02/24 1400  BP: (!) 101/53 (!) 101/56 (!) 95/51 103/63  Pulse: 85 83 75 77  Resp: 12 (!) 8    Temp:      TempSrc:      SpO2: 96% 97% 97% 99%  Weight:      Height:       Physical Exam HENT:     Head: Normocephalic.     Mouth/Throat:     Pharynx: No oropharyngeal exudate.  Eyes:     General: Lids are normal.     Conjunctiva/sclera: Conjunctivae normal.  Cardiovascular:     Rate and Rhythm: Normal rate and regular rhythm.     Heart sounds: Normal heart sounds, S1 normal and S2 normal.  Pulmonary:     Breath sounds: No decreased breath sounds, wheezing, rhonchi or rales.  Abdominal:     Palpations: Abdomen is soft.     Tenderness: There is no abdominal tenderness.  Musculoskeletal:     Right lower leg: No swelling.     Left lower leg: Swelling present.  Skin:    General: Skin is warm.     Comments: Gangrene on the lateral 4th toe right foot and circumferential gangrene fifth toe right foot..  Neurological:     Mental Status: He is alert and oriented to person, place, and time.     Data Reviewed: Creatinine 0.74, hemoglobin 13.5  Family Communication: Left message for wife  Disposition:  Status is: Inpatient Remains inpatient appropriate because: Amputation 4th and 5th toes today  Planned Discharge Destination: Home with Home Health    Time spent: 28 minutes  Author: Verla Glaze, MD 03/02/2024 2:02 PM  For on call review [email or web address].

## 2024-03-02 NOTE — Plan of Care (Signed)
  Problem: Education: Goal: Ability to describe self-care measures that may prevent or decrease complications (Diabetes Survival Skills Education) will improve Outcome: Progressing Goal: Individualized Educational Video(s) Outcome: Progressing   Problem: Coping: Goal: Ability to adjust to condition or change in health will improve Outcome: Progressing   Problem: Fluid Volume: Goal: Ability to maintain a balanced intake and output will improve Outcome: Progressing   Problem: Health Behavior/Discharge Planning: Goal: Ability to identify and utilize available resources and services will improve Outcome: Progressing Goal: Ability to manage health-related needs will improve Outcome: Progressing   Problem: Metabolic: Goal: Ability to maintain appropriate glucose levels will improve Outcome: Progressing   Problem: Nutritional: Goal: Maintenance of adequate nutrition will improve Outcome: Progressing Goal: Progress toward achieving an optimal weight will improve Outcome: Progressing   Problem: Skin Integrity: Goal: Risk for impaired skin integrity will decrease Outcome: Progressing   Problem: Tissue Perfusion: Goal: Adequacy of tissue perfusion will improve Outcome: Progressing   Problem: Education: Goal: Knowledge of General Education information will improve Description: Including pain rating scale, medication(s)/side effects and non-pharmacologic comfort measures Outcome: Progressing   Problem: Health Behavior/Discharge Planning: Goal: Ability to manage health-related needs will improve Outcome: Progressing

## 2024-03-02 NOTE — Progress Notes (Signed)
 PHARMACY - ANTICOAGULATION CONSULT NOTE  Pharmacy Consult for Heparin  Infusion Indication: Ischemic Foot   No Known Allergies  Patient Measurements: Height: 6' (182.9 cm) Weight: 98.4 kg (217 lb) IBW/kg (Calculated) : 77.6 HEPARIN  DW (KG): 97.4  Vital Signs: Temp: 97.8 F (36.6 C) (05/09 1957) Temp Source: Oral (05/09 1957) BP: 100/70 (05/09 1957) Pulse Rate: 66 (05/09 1957)  Labs: Recent Labs    02/28/24 1301 02/28/24 1831 02/29/24 0324 02/29/24 1109 03/01/24 0550 03/02/24 0204  HGB  --   --  12.9*  --  13.8 13.5  HCT  --   --  39.3  --  40.3 39.1  PLT  --   --  258  --  237 226  APTT  --  33  --   --   --   --   LABPROT  --  13.8  --   --   --   --   INR  --  1.0  --   --   --   --   HEPARINUNFRC   < >  --  0.49 0.39 0.31 0.40  CREATININE  --   --  0.76  --  0.66 0.74   < > = values in this interval not displayed.    Estimated Creatinine Clearance: 101.4 mL/min (by C-G formula based on SCr of 0.74 mg/dL).   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)    Pneumonia 07/05/2023   admitted to [facility]   Polyneuropathy    S/P CABG x 5 2017   Sepsis (HCC) 07/05/2023   Severe nonproliferative diabetic retinopathy of left eye (HCC)    Stroke (HCC) 2014   mini, tingling right hand   Medications:  Scheduled:   aspirin  EC  81 mg Oral Daily   atorvastatin   80 mg Oral Daily   clopidogrel   75 mg Oral Daily   empagliflozin   25 mg Oral Daily   insulin  aspart  0-5 Units Subcutaneous QHS    insulin  aspart  0-9 Units Subcutaneous TID WC   insulin  glargine-yfgn  10 Units Subcutaneous QHS   povidone-iodine   2 Application Topical Once   sacubitril -valsartan   1 tablet Oral Q12H   sodium chloride  flush  3 mL Intravenous Q12H   spironolactone   12.5 mg Oral Daily   Infusions:   sodium chloride      cefTRIAXone  (ROCEPHIN )  IV     heparin  1,500 Units/hr (03/02/24 0032)   vancomycin  1,500 mg (03/01/24 1812)   Assessment: 73 yo male sent from podiatry office for gangrene on 4th and 5th toe. He has a PMH of CAD, Stroke, PVD, DM and CHF. Pharmacy consulted for heparin  dosing and monitoring for ischemic foot. Vascular consulted and planning angiogram on Friday.  Date Time HL Rate/Comment 05/08 0324 0.49 Therapeutic x1 on 1400 units/hour 05/08 1109 0.39 Therapeutic x2 05/09   0550    0.31    Therapeutic X 3  05/10   0204    0.40    Therapeutic X 4   Goal of Therapy:  Heparin  level 0.3-0.7 units/ml Monitor platelets by anticoagulation protocol: Yes   Plan:  5/10:  HL @ 0204 = 0.40, therapeutic X 4 - Will continue heparin  at current rate and recheck HL on 5/11 with AM labs ---Monitor CBC and signs/symptoms of bleeding  Thank you for involving pharmacy in this patient's care.   Akiah Bauch D Clinical Pharmacist 03/02/2024 2:42 AM

## 2024-03-02 NOTE — Anesthesia Preprocedure Evaluation (Signed)
 Anesthesia Evaluation  Patient identified by MRN, date of birth, ID band Patient awake    Reviewed: Allergy & Precautions, NPO status , Patient's Chart, lab work & pertinent test results  History of Anesthesia Complications Negative for: history of anesthetic complications  Airway Mallampati: III  TM Distance: >3 FB Neck ROM: full    Dental  (+) Chipped, Poor Dentition, Dental Advidsory Given   Pulmonary neg pulmonary ROS   Pulmonary exam normal        Cardiovascular hypertension, (-) angina + CAD, + Past MI, + CABG, + Peripheral Vascular Disease and +CHF  (-) Cardiac Stents and (-) DOE Normal cardiovascular exam(-) dysrhythmias (-) Valvular Problems/Murmurs  Echo 08/2023  IMPRESSIONS     1. Left ventricular ejection fraction, by estimation, is 20 to 25%. The  left ventricle has severely decreased function. The left ventricle  demonstrates global hypokinesis. Left ventricular diastolic parameters are  consistent with Grade II diastolic  dysfunction (pseudonormalization).   2. Right ventricular systolic function is moderately reduced. The right  ventricular size is mildly enlarged.   3. Left atrial size was mildly dilated.   4. The mitral valve is normal in structure. Mild mitral valve  regurgitation.   5. The aortic valve is normal in structure. Aortic valve regurgitation is  trivial. Aortic valve sclerosis is present, with no evidence of aortic  valve stenosis.     Neuro/Psych neg Seizures  Neuromuscular disease CVA, Residual Symptoms  negative psych ROS   GI/Hepatic Neg liver ROS,GERD  ,,  Endo/Other  diabetes, Poorly Controlled, Type 2    Renal/GU      Musculoskeletal   Abdominal   Peds  Hematology negative hematology ROS (+)   Anesthesia Other Findings Past Medical History: No date: Acute cholecystitis No date: Anginal pain (HCC) No date: CHF (congestive heart failure) (HCC) No date: Chronic lower  back pain No date: Coronary artery disease No date: DM (diabetes mellitus), type 2 (HCC) No date: Elevated LFTs No date: Facial cellulitis No date: Frequent falls No date: GERD (gastroesophageal reflux disease) No date: History of concussion No date: History of kidney stones No date: Hypertension No date: Ischemic cardiomyopathy No date: Metabolic acidosis No date: 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  (HCC) 07/05/2023: Pneumonia     Comment:  admitted to [facility] No date: Polyneuropathy 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 07/05/2023: Sepsis (HCC) No date: Severe nonproliferative diabetic retinopathy of left eye  (HCC) 2014: Stroke [facility])     Comment:  mini, tingling right hand  Past Surgical History: 08/09/2016: CARDIAC CATHETERIZATION; Left     Comment:  Procedure: Left Heart Cath and Coronary Angiography;                Surgeon: Ronney Cola, MD;  Location: [facility] INVASIVE CV               LAB;  Service: Cardiovascular;  Laterality: Left; 09/28/2023: CAROTID PTA/STENT INTERVENTION; Left     Comment:  Procedure: CAROTID PTA/STENT INTERVENTION;  Surgeon:               Jackquelyn Mass, MD;  Location: [facility] INVASIVE CV LAB;               Service: Cardiovascular;  Laterality: Left; 02/06/2023: CATARACT EXTRACTION W/PHACO; Right     Comment:  Procedure: CATARACT EXTRACTION PHACO AND INTRAOCULAR               LENS PLACEMENT (IOC) AHMED TUBE SHUNT W/ TUTOPLAST RIGHT  DIABETIC  3.94   00:42.6;  Surgeon: Rosa College,               MD;  Location: [facility];  Service:               Ophthalmology;  Laterality: Right; 02/02/2021: COLONOSCOPY WITH PROPOFOL ; N/A     Comment:  Procedure: COLONOSCOPY WITH PROPOFOL ;  Surgeon:               Shane Darling, MD;  Location: [facility] ENDOSCOPY;                Service: Endoscopy;  Laterality: N/A; 2017: CORONARY ARTERY BYPASS GRAFT     Comment:  x5 12/11/2023: IR CHOLANGIOGRAM EXISTING TUBE 09/01/2023: IR  EXCHANGE BILIARY DRAIN 11/17/2023: IR EXCHANGE BILIARY DRAIN 07/28/2023: IR PERC CHOLECYSTOSTOMY 11/17/2023: IR RADIOLOGIST EVAL & MGMT  BMI    Body Mass Index: 30.15 kg/m      Reproductive/Obstetrics negative OB ROS                             Anesthesia Physical Anesthesia Plan  ASA: 3  Anesthesia Plan: General   Post-op Pain Management: Ofirmev  IV (intra-op)* and Toradol  IV (intra-op)*   Induction: Intravenous, Rapid sequence and Cricoid pressure planned  PONV Risk Score and Plan: Dexamethasone , Ondansetron  and Treatment may vary due to age or medical condition  Airway Management Planned: Oral ETT  Additional Equipment:   Intra-op Plan:   Post-operative Plan: Extubation in OR  Informed Consent: I have reviewed the patients History and Physical, chart, labs and discussed the procedure including the risks, benefits and alternatives for the proposed anesthesia with the patient or authorized representative who has indicated his/her understanding and acceptance.     Dental Advisory Given  Plan Discussed with: Anesthesiologist, CRNA and Surgeon  Anesthesia Plan Comments: (Patient consented for risks of anesthesia including but not limited to:  - adverse reactions to medications - damage to eyes, teeth, lips or other oral mucosa - nerve damage due to positioning  - sore throat or hoarseness - Damage to heart, brain, nerves, lungs, other parts of body or loss of life  Patient voiced understanding and assent.)        Anesthesia Quick Evaluation

## 2024-03-02 NOTE — Progress Notes (Signed)
      Daily Progress Note   Assessment/Planning:   POD #1 s/p PTA+S R SFA and pop, bioabs stenting R ATA  No access site complicaiton Good skin flap perfusion was noted intra-op by Podiatry Can discontinue Heparin  drip.  Continue anti-platelet regimen   Subjective  - * Day of Surgery *   Just came back from OR with Podiatry, Right foot feels better   Objective   Vitals:   03/02/24 1226 03/02/24 1230 03/02/24 1245 03/02/24 1300  BP: 118/66 108/63 (!) 101/53 (!) 101/56  Pulse: 96 87 85 83  Resp: (!) 9 17 12  (!) 8  Temp: 97.6 F (36.4 C)     TempSrc:      SpO2: 97% 100% 96% 97%  Weight:      Height:         Intake/Output Summary (Last 24 hours) at 03/02/2024 1327 Last data filed at 03/02/2024 1130 Gross per 24 hour  Intake 2723.88 ml  Output 300 ml  Net 2423.88 ml    VASC L groin: no hematoma, no echymosis, no bruit R foot: bandaged, just back from OR    Laboratory   CBC    Latest Ref Rng & Units 03/02/2024    2:04 AM 03/01/2024    5:50 AM 02/29/2024    3:24 AM  CBC  WBC 4.0 - 10.5 K/uL 7.1  6.3  8.8   Hemoglobin 13.0 - 17.0 g/dL 16.1  09.6  04.5   Hematocrit 39.0 - 52.0 % 39.1  40.3  39.3   Platelets 150 - 400 K/uL 226  237  258     BMET    Component Value Date/Time   NA 137 03/02/2024 0204   K 3.7 03/02/2024 0204   CL 109 03/02/2024 0204   CO2 21 (L) 03/02/2024 0204   GLUCOSE 102 (H) 03/02/2024 0204   BUN 18 03/02/2024 0204   CREATININE 0.74 03/02/2024 0204   CALCIUM  8.6 (L) 03/02/2024 0204   GFRNONAA >60 03/02/2024 0204   GFRAA >60 03/17/2019 1306     Roxy Cordial, MD, FACS, FSVS Covering for [facility]: [phone]  03/02/2024, 1:27 PM

## 2024-03-03 ENCOUNTER — Encounter: Payer: Self-pay | Admitting: Podiatry

## 2024-03-03 DIAGNOSIS — M79671 Pain in right foot: Secondary | ICD-10-CM | POA: Diagnosis not present

## 2024-03-03 DIAGNOSIS — E663 Overweight: Secondary | ICD-10-CM | POA: Diagnosis not present

## 2024-03-03 DIAGNOSIS — E1169 Type 2 diabetes mellitus with other specified complication: Secondary | ICD-10-CM | POA: Diagnosis not present

## 2024-03-03 DIAGNOSIS — I96 Gangrene, not elsewhere classified: Secondary | ICD-10-CM | POA: Diagnosis not present

## 2024-03-03 LAB — GLUCOSE, CAPILLARY
Glucose-Capillary: 136 mg/dL — ABNORMAL HIGH (ref 70–99)
Glucose-Capillary: 150 mg/dL — ABNORMAL HIGH (ref 70–99)

## 2024-03-03 MED ORDER — CEFADROXIL 500 MG PO CAPS: 500.0000 mg | ORAL_CAPSULE | Freq: Two times a day (BID) | ORAL | 0 refills | Status: AC

## 2024-03-03 MED ORDER — CEFADROXIL 500 MG PO CAPS
500.0000 mg | ORAL_CAPSULE | Freq: Two times a day (BID) | ORAL | Status: DC
  Administered 2024-03-03: 500 mg via ORAL
  Filled 2024-03-03: qty 1

## 2024-03-03 MED ORDER — LANTUS SOLOSTAR 100 UNIT/ML ~~LOC~~ SOPN: 25.0000 [IU] | PEN_INJECTOR | Freq: Every day | SUBCUTANEOUS | Status: AC

## 2024-03-03 MED ORDER — OXYCODONE HCL 5 MG PO TABS: 5.0000 mg | ORAL_TABLET | Freq: Four times a day (QID) | ORAL | 0 refills | Status: AC | PRN

## 2024-03-03 NOTE — TOC Transition Note (Signed)
 Transition of Care [facility]) - Discharge Note   Patient Details  Name: Alexander [name] MRN: [id number] Date of Birth: 03/17/1951  Transition of Care [facility]) CM/SW Contact:  [place] L Felisha Claytor, LCSW Phone Number: 03/03/2024, 12:05 PM   Clinical Narrative:     CSW left messages for [facility] and [facility] to schedule HH. F/U needed 5.11.2025. Attending MD agreed to move forward with discharge as pt expressed he is ready to return home.         Patient Goals and CMS Choice            Discharge Placement                       Discharge Plan and Services Additional resources added to the After Visit Summary for                                       Social Drivers of Health (SDOH) Interventions SDOH Screenings   Food Insecurity: No Food Insecurity (02/28/2024)  Housing: Low Risk  (02/28/2024)  Transportation Needs: No Transportation Needs (02/28/2024)  Utilities: Not At Risk (02/28/2024)  Alcohol Screen: Low Risk  (09/29/2023)  Financial Resource Strain: Low Risk  (02/28/2024)   Received from [facility]  Social Connections: Socially Integrated (02/28/2024)  Tobacco Use: Low Risk  (03/02/2024)     Readmission Risk Interventions     No data to display

## 2024-03-03 NOTE — Plan of Care (Signed)
  Problem: Education: Goal: Ability to describe self-care measures that may prevent or decrease complications (Diabetes Survival Skills Education) will improve Outcome: Adequate for Discharge Goal: Individualized Educational Video(s) Outcome: Adequate for Discharge   Problem: Coping: Goal: Ability to adjust to condition or change in health will improve Outcome: Adequate for Discharge   Problem: Fluid Volume: Goal: Ability to maintain a balanced intake and output will improve Outcome: Adequate for Discharge   Problem: Health Behavior/Discharge Planning: Goal: Ability to identify and utilize available resources and services will improve Outcome: Adequate for Discharge Goal: Ability to manage health-related needs will improve Outcome: Adequate for Discharge   Problem: Metabolic: Goal: Ability to maintain appropriate glucose levels will improve Outcome: Adequate for Discharge   Problem: Nutritional: Goal: Maintenance of adequate nutrition will improve Outcome: Adequate for Discharge Goal: Progress toward achieving an optimal weight will improve Outcome: Adequate for Discharge   Problem: Skin Integrity: Goal: Risk for impaired skin integrity will decrease Outcome: Adequate for Discharge   Problem: Tissue Perfusion: Goal: Adequacy of tissue perfusion will improve Outcome: Adequate for Discharge   Problem: Education: Goal: Knowledge of General Education information will improve Description: Including pain rating scale, medication(s)/side effects and non-pharmacologic comfort measures Outcome: Adequate for Discharge   Problem: Health Behavior/Discharge Planning: Goal: Ability to manage health-related needs will improve Outcome: Adequate for Discharge   Problem: Clinical Measurements: Goal: Ability to maintain clinical measurements within normal limits will improve Outcome: Adequate for Discharge Goal: Will remain free from infection Outcome: Adequate for Discharge Goal:  Diagnostic test results will improve Outcome: Adequate for Discharge Goal: Respiratory complications will improve Outcome: Adequate for Discharge Goal: Cardiovascular complication will be avoided Outcome: Adequate for Discharge   Problem: Activity: Goal: Risk for activity intolerance will decrease Outcome: Adequate for Discharge   Problem: Nutrition: Goal: Adequate nutrition will be maintained Outcome: Adequate for Discharge   Problem: Coping: Goal: Level of anxiety will decrease Outcome: Adequate for Discharge   Problem: Elimination: Goal: Will not experience complications related to bowel motility Outcome: Adequate for Discharge Goal: Will not experience complications related to urinary retention Outcome: Adequate for Discharge   Problem: Pain Managment: Goal: General experience of comfort will improve and/or be controlled Outcome: Adequate for Discharge   Problem: Safety: Goal: Ability to remain free from injury will improve Outcome: Adequate for Discharge   Problem: Skin Integrity: Goal: Risk for impaired skin integrity will decrease Outcome: Adequate for Discharge   Problem: Education: Goal: Understanding of CV disease, CV risk reduction, and recovery process will improve Outcome: Adequate for Discharge Goal: Individualized Educational Video(s) Outcome: Adequate for Discharge   Problem: Activity: Goal: Ability to return to baseline activity level will improve Outcome: Adequate for Discharge   Problem: Cardiovascular: Goal: Ability to achieve and maintain adequate cardiovascular perfusion will improve Outcome: Adequate for Discharge Goal: Vascular access site(s) Level 0-1 will be maintained Outcome: Adequate for Discharge   Problem: Health Behavior/Discharge Planning: Goal: Ability to safely manage health-related needs after discharge will improve Outcome: Adequate for Discharge

## 2024-03-03 NOTE — Evaluation (Signed)
 Physical Therapy Evaluation Patient Details Name: Alexander [name] MRN: [id number] DOB: 1951-09-08 Today's Date: 03/03/2024  History of Present Illness  Alexander [name] is a 73 y.o. male with medical history significant of CAD, CHF, diabetes, hypertension, stroke, peripheral vascular disease.  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 NWB on R, heel weight bearing for transfers with post op shoe.   Clinical Impression  Patient received coming out of bathroom pushing IV pole. Educated patient that he should be NWB on R LE, or heel weight bearing. Minimal mobility in order for foot to heal. Patient verbalized understanding. He was not aware of WB status and that he should be using walker. Patient has a walker at home. He is able to hop short distances. Patient will benefit from having wheelchair for longer distances. He will continue to benefit from skilled PT to ensure safety with mobility and WB adherence.         If plan is discharge home, recommend the following: A little help with walking and/or transfers;Help with stairs or ramp for entrance;Assist for transportation   Can travel by private vehicle    yes    Equipment Recommendations Wheelchair (measurements PT)  Recommendations for Other Services       Functional Status Assessment Patient has had a recent decline in their functional status and demonstrates the ability to make significant improvements in function in a reasonable and predictable amount of time.     Precautions / Restrictions Precautions Precautions: Fall Recall of Precautions/Restrictions: Impaired Precaution/Restrictions Comments: post op shoe Restrictions Weight Bearing Restrictions Per Provider Order: Yes RLE Weight Bearing Per Provider Order: Non weight bearing Other Position/Activity Restrictions: can weight bear on heel for  transfers with post op shoe      Mobility  Bed Mobility Overal bed mobility: Independent                  Transfers Overall transfer level: Needs assistance Equipment used: Rolling walker (2 wheels) Transfers: Sit to/from Stand Sit to Stand: Contact guard assist                Ambulation/Gait Ambulation/Gait assistance: Min assist Gait Distance (Feet): 5 Feet Assistive device: Rolling walker (2 wheels) Gait Pattern/deviations: Step-to pattern Gait velocity: decr     General Gait Details: patient is able to hop short distance. Requires min A for safety  Stairs            Wheelchair Mobility     Tilt Bed    Modified Rankin (Stroke Patients Only)       Balance Overall balance assessment: Needs assistance Sitting-balance support: Feet supported Sitting balance-Leahy Scale: Normal     Standing balance support: Bilateral upper extremity supported, During functional activity, Reliant on assistive device for balance Standing balance-Leahy Scale: Poor                               Pertinent Vitals/Pain Pain Assessment Pain Assessment: No/denies pain    Home Living Family/patient expects to be discharged to:: Private residence Living Arrangements: Spouse/significant other Available Help at Discharge: Family;Available 24 hours/day Type of Home: House Home Access: Stairs to enter Entrance Stairs-Rails: Left Entrance Stairs-Number of Steps: 8   Home Layout: Two level Home Equipment: Agricultural consultant (2 wheels);Shower seat  Prior Function Prior Level of Function : Working/employed;Independent/Modified Independent;Driving             Mobility Comments: Pt reports IND prior to admission ADLs Comments: Pt reports IND prior to admission.     Extremity/Trunk Assessment        Lower Extremity Assessment Lower Extremity Assessment: RLE deficits/detail RLE Deficits / Details: patient up walking into bathroom pushing IV  pole. Supposed to be NWB on R LE RLE Coordination: decreased gross motor    Cervical / Trunk Assessment Cervical / Trunk Assessment: Normal  Communication   Communication Communication: No apparent difficulties    Cognition Arousal: Alert Behavior During Therapy: WFL for tasks assessed/performed   PT - Cognitive impairments: Problem solving, Safety/Judgement, Awareness                         Following commands: Intact       Cueing Cueing Techniques: Verbal cues     General Comments      Exercises     Assessment/Plan    PT Assessment Patient needs continued PT services  PT Problem List Decreased balance;Decreased mobility;Decreased safety awareness;Decreased knowledge of precautions;Decreased knowledge of use of DME;Decreased skin integrity       PT Treatment Interventions DME instruction;Gait training;Stair training;Functional mobility training;Therapeutic activities;Therapeutic exercise;Balance training;Patient/family education    PT Goals (Current goals can be found in the Care Plan section)  Acute Rehab PT Goals Patient Stated Goal: return home PT Goal Formulation: With patient Time For Goal Achievement: 03/10/24 Potential to Achieve Goals: Good    Frequency Min 3X/week     Co-evaluation               AM-PAC PT "6 Clicks" Mobility  Outcome Measure Help needed turning from your back to your side while in a flat bed without using bedrails?: None Help needed moving from lying on your back to sitting on the side of a flat bed without using bedrails?: None Help needed moving to and from a bed to a chair (including a wheelchair)?: A Little Help needed standing up from a chair using your arms (e.g., wheelchair or bedside chair)?: A Little Help needed to walk in hospital room?: A Lot Help needed climbing 3-5 steps with a railing? : A Lot 6 Click Score: 18    End of Session   Activity Tolerance: Patient tolerated treatment well Patient left:  in bed;with call bell/phone within reach Nurse Communication: Mobility status;Precautions;Weight bearing status PT Visit Diagnosis: Unsteadiness on feet (R26.81);Other abnormalities of gait and mobility (R26.89);Muscle weakness (generalized) (M62.81);Difficulty in walking, not elsewhere classified (R26.2)    Time: 1015-1030 PT Time Calculation (min) (ACUTE ONLY): 15 min   Charges:   PT Evaluation $PT Eval Moderate Complexity: 1 Mod   PT General Charges $$ ACUTE PT VISIT: 1 Visit         Shakti Fleer, PT, GCS 03/03/24,11:26 AM

## 2024-03-03 NOTE — Discharge Summary (Signed)
 Physician Discharge Summary   Patient: Alexander [name] MRN: [id number] DOB: [birth year]  Admit date:     02/28/2024  Discharge date: 03/03/24  Discharge Physician: Verla Glaze   PCP: Deliah Fells, NP   Recommendations at discharge:   Follow-up PCP 5 days Follow-up with Dr. Althea Atkinson on Friday  Discharge Diagnoses: Principal Problem:   Ischemic pain of right foot Active Problems:   Gangrene (HCC)   Type 2 diabetes mellitus with hyperlipidemia (HCC)   Chronic systolic CHF (congestive heart failure) (HCC)   History of CAD (coronary artery disease)   Overweight (BMI 25.0-29.9)    Hospital Course: 73 y.o. male with medical history significant of CAD, CHF, diabetes, hypertension, stroke, peripheral vascular disease.  He presents to the ER after being sent in from the podiatry office for gangrene on 4th and 5th toe.  Patient states that he has been having some pain for a while in his feet and can hardly stand on it.  He noticed some blackness on his toe for about a week.  He had an amputation of his second toe back on March 30.  No fever but has had some chills.  Podiatry sent him for vascular evaluation.   5/8.  Patient for angiogram tomorrow.  Still having pain in toes.  Likely will need amputation and level will depend on angiogram results. 5/9.  Angiogram with angioplasty and stent placement of right superficial femoral and popliteal arteries and also anterior tibial artery. 5/10.  Amputation of right fourth toe and PIPJ and right fifth ray.  Assessment and Plan: * Ischemic pain of right foot Vascular team performed angiogram on 5/9 and had angioplasties and stents placed to get better blood supply to right foot.  Patient on aspirin  Plavix  and Lipitor .    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  Pain control.  Seen by podiatry and cleared to go home on Duricef for 5 days.  Patient will follow up on Friday with Dr. Althea Atkinson for  appointment.  Type 2 diabetes mellitus with hyperlipidemia (HCC) Hemoglobin A1c 6.4.  Placed on sliding scale.  Sugars on the lower side, I am not sure he needs the amount of insulin  he was taking at home will give 25 units at night.  Overweight (BMI 25.0-29.9) BMI 29.43 with current height and weight in computer  History of CAD (coronary artery disease) On aspirin  Plavix  and Lipitor   Chronic systolic CHF (congestive heart failure) (HCC) EF 30 to 35%.  Continue Jardiance , Entresto  and Aldactone .  No signs of heart failure while here in the hospital.         Consultants: Vascular surgery, podiatry Procedures performed: Angiogram with angioplasties and stents, amputation of 4th and 5th toes. Disposition: Home health Diet recommendation:  Cardiac and Carb modified diet DISCHARGE MEDICATION: Allergies as of 03/03/2024   No Known Allergies      Medication List     TAKE these medications    acetaminophen  500 MG tablet Commonly known as: TYLENOL  Take 1,000 mg by mouth every 6 (six) hours as needed for mild pain or headache.   aspirin  EC 81 MG tablet Take 1 tablet (81 mg total) by mouth daily.   atorvastatin  80 MG tablet Commonly known as: LIPITOR  Take 1 tablet (80 mg total) by mouth daily. Hold until you see your doctor and have your liver enzymes repeated   cefadroxil 500 MG capsule Commonly known as: DURICEF Take 1 capsule (500 mg  total) by mouth 2 (two) times daily for 5 days.   clopidogrel  75 MG tablet Commonly known as: PLAVIX  Take 1 tablet (75 mg total) by mouth daily.   Jardiance  25 MG Tabs tablet Generic drug: empagliflozin  Take 25 mg by mouth daily.   Lantus  SoloStar 100 UNIT/ML Solostar Pen Generic drug: insulin  glargine Inject 25 Units into the skin at bedtime. What changed: how much to take   oxyCODONE  5 MG immediate release tablet Commonly known as: Oxy IR/ROXICODONE  Take 1 tablet (5 mg total) by mouth every 6 (six) hours as needed for severe  pain (pain score 7-10).   Ozempic (2 MG/DOSE) 8 MG/3ML Sopn Generic drug: Semaglutide (2 MG/DOSE) Inject 2 mg into the skin every 7 (seven) days. Sundays   sacubitril -valsartan  24-26 MG Commonly known as: ENTRESTO  Take 1 tablet by mouth every 12 (twelve) hours.   spironolactone  25 MG tablet Commonly known as: ALDACTONE  Take 12.5 mg by mouth daily.               Durable Medical Equipment  (From admission, onward)           Start     Ordered   03/03/24 1120  For home use only DME standard manual wheelchair with seat cushion  Once       Comments: Patient suffers from toe amputation which impairs their ability to perform daily activities like bathing in the home.  A walker will not resolve issue with performing activities of daily living. A wheelchair will allow patient to safely perform daily activities. Patient can safely propel the wheelchair in the home or has a caregiver who can provide assistance. Length of need 6 months . Accessories: elevating leg rests (ELRs), wheel locks, extensions and anti-tippers.   03/03/24 1119            Discharge Exam: Filed Weights   02/28/24 1256  Weight: 98.4 kg   Physical Exam HENT:     Head: Normocephalic.     Mouth/Throat:     Pharynx: No oropharyngeal exudate.  Eyes:     General: Lids are normal.     Conjunctiva/sclera: Conjunctivae normal.  Cardiovascular:     Rate and Rhythm: Normal rate and regular rhythm.     Heart sounds: Normal heart sounds, S1 normal and S2 normal.  Pulmonary:     Breath sounds: No decreased breath sounds, wheezing, rhonchi or rales.  Abdominal:     Palpations: Abdomen is soft.     Tenderness: There is no abdominal tenderness.  Musculoskeletal:     Right lower leg: No swelling.     Left lower leg: No swelling.  Skin:    General: Skin is warm.     Comments: Right foot covered  Neurological:     Mental Status: He is alert and oriented to person, place, and time.      Condition at  discharge: stable  The results of significant diagnostics from this hospitalization (including imaging, microbiology, ancillary and laboratory) are listed below for reference.   Imaging Studies: PERIPHERAL VASCULAR CATHETERIZATION Result Date: 03/01/2024 See surgical note for result.  DG Foot Complete Right Result Date: 02/28/2024 CLINICAL DATA:  Toe pain EXAM: RIGHT FOOT COMPLETE - 3+ VIEW COMPARISON:  01/17/2024 FINDINGS: Interval amputation of the second toe at the MTP joint level. Bandaging along the fourth and fifth toes and separately along the great toe. No bony destructive findings characteristic of osteomyelitis. Atheromatous vascular calcifications. Plantar calcaneal spur. Dorsal subcutaneous edema along the forefoot. IMPRESSION: 1.  Interval amputation of the second toe at the MTP joint level. 2. No bony destructive findings characteristic of osteomyelitis. 3. Dorsal subcutaneous edema along the forefoot. 4. Atheromatous vascular calcifications. 5. Plantar calcaneal spur. Electronically Signed   By: Freida Jes M.D.   On: 02/28/2024 14:42   VAS US  ABI WITH/WO TBI Result Date: 02/19/2024  LOWER EXTREMITY DOPPLER STUDY Patient Name:  Alexander [name]  Date of Exam:   02/15/2024 Medical Rec #: [id number]      Accession #:    [id number] Date of Birth: 05-04-1951      Patient Gender: M Patient Age:   45 years Exam Location:  [facility] Procedure:      VAS US  ABI WITH/WO TBI Referring Phys: [facility] --------------------------------------------------------------------------------  Indications: Ulceration.  Vascular Interventions: 01/19/2024: PTA Right Popliteal Artery to 5 mm.                         Percutaneous laser atherectomy with transluminal                         Angioplasty of the Rt Anterior tibial Artery. Performing Technologist: Tonie Franks RVS  Examination Guidelines: A complete evaluation includes at minimum, Doppler waveform signals and systolic blood pressure  reading at the level of bilateral brachial, anterior tibial, and posterior tibial arteries, when vessel segments are accessible. Bilateral testing is considered an integral part of a complete examination. Photoelectric Plethysmograph (PPG) waveforms and toe systolic pressure readings are included as required and additional duplex testing as needed. Limited examinations for reoccurring indications may be performed as noted.  ABI Findings: +---------+------------------+-----+----------+--------+ Right    Rt Pressure (mmHg)IndexWaveform  Comment  +---------+------------------+-----+----------+--------+ Brachial 138                                       +---------+------------------+-----+----------+--------+ ATA      223               1.62 monophasic         +---------+------------------+-----+----------+--------+ PTA      94                0.68 monophasic         +---------+------------------+-----+----------+--------+ PERO     112               0.81 monophasic         +---------+------------------+-----+----------+--------+ Great Toe92                0.67 Abnormal           +---------+------------------+-----+----------+--------+ +---------+------------------+-----+----------+-------+ Left     Lt Pressure (mmHg)IndexWaveform  Comment +---------+------------------+-----+----------+-------+ Brachial 134                                      +---------+------------------+-----+----------+-------+ ATA      144               1.04 biphasic          +---------+------------------+-----+----------+-------+ PTA      0                 0.00 absent            +---------+------------------+-----+----------+-------+ PERO     250  1.81 monophasic        +---------+------------------+-----+----------+-------+ Great Toe124               0.90 Normal            +---------+------------------+-----+----------+-------+  +-------+-----------+-----------+------------+------------+ ABI/TBIToday's ABIToday's TBIPrevious ABIPrevious TBI +-------+-----------+-----------+------------+------------+ Right  1.62       .67                                 +-------+-----------+-----------+------------+------------+ Left   >1.0 [place]    .90                                 +-------+-----------+-----------+------------+------------+   Summary: Right: Resting right ankle-brachial index is within normal range. The right toe-brachial index is abnormal. Imaging and Waveforms obtained of the Right Posterior Tibial Artery, Anterior Tibial Artery and Peroneal Artery. Monophasic Waveforms seen in all vessels at the level of the ankle. Left: Resting left ankle-brachial index indicates noncompressible left lower extremity arteries. The left toe-brachial index is normal. Imaging and Waveforms obtained of the Left Posterior Tibial Artery, Anterior Tibial Artery and Peroneal Artery. No flow seen in the Left PTA; ATA displays Biphasic Waveforms at the level of the Ankle.  *See table(s) above for measurements and observations.  Electronically signed by Devon Fogo MD on 02/19/2024 at 11:18:59 AM.    Final     Microbiology: Results for orders placed or performed during the hospital encounter of 02/28/24  Blood culture (routine x 2)     Status: None (Preliminary result)   Collection Time: 02/28/24  1:01 PM   Specimen: BLOOD  Result Value Ref Range Status   Specimen Description BLOOD BLOOD LEFT WRIST  Final   Special Requests   Final    BOTTLES DRAWN AEROBIC AND ANAEROBIC Blood Culture results may not be optimal due to an inadequate volume of blood received in culture bottles   Culture   Final    NO GROWTH 4 DAYS Performed at [facility], [address] [place], [place] [address]    Report Status PENDING  Incomplete  Blood culture (routine x 2)     Status: None (Preliminary result)   Collection Time: 02/28/24  5:04 PM    Specimen: BLOOD  Result Value Ref Range Status   Specimen Description BLOOD BLOOD LEFT ARM  Final   Special Requests   Final    BOTTLES DRAWN AEROBIC AND ANAEROBIC Blood Culture results may not be optimal due to an inadequate volume of blood received in culture bottles   Culture   Final    NO GROWTH 4 DAYS Performed at [facility], [address] [place], [place] [address]    Report Status PENDING  Incomplete  MRSA Next Gen by PCR, Nasal     Status: None   Collection Time: 03/01/24  9:28 PM   Specimen: Nasal Mucosa; Nasal Swab  Result Value Ref Range Status   MRSA by PCR Next Gen NOT DETECTED NOT DETECTED Final    Comment: (NOTE) The GeneXpert MRSA Assay (FDA approved for NASAL specimens only), is one component of a comprehensive MRSA colonization surveillance program. It is not intended to diagnose MRSA infection nor to guide or monitor treatment for MRSA infections. Test performance is not FDA approved in patients less than 61 years old. Performed at [facility], [address], [place], [place] [address]  Aerobic/Anaerobic Culture w Gram Stain (surgical/deep wound)     Status: None (Preliminary result)   Collection Time: 03/02/24 11:50 AM   Specimen: Foot, Right; Wound  Result Value Ref Range Status   Specimen Description   Final    WOUND Performed at [facility], [address] [place], [place] [address]    Special Requests   Final    RF Performed at [facility], [address] [place], [place] [address]    Gram Stain   Final    RARE WBC PRESENT, PREDOMINANTLY PMN NO ORGANISMS SEEN    Culture   Final    NO GROWTH < 12 HOURS Performed at [facility], [address] [place], [place] [address]    Report Status PENDING  Incomplete    Labs: CBC: Recent Labs  Lab 02/28/24 1301 02/29/24 0324 03/01/24 0550 03/02/24 0204  WBC 7.7 8.8 6.3 7.1  NEUTROABS 5.0  --   --   --   HGB 14.6 12.9* 13.8 13.5  HCT 43.8  39.3 40.3 39.1  MCV 95.6 96.3 92.6 93.1  PLT 255 258 237 226   Basic Metabolic Panel: Recent Labs  Lab 02/28/24 1301 02/29/24 0324 03/01/24 0550 03/02/24 0204  NA 136 137 137 137  K 4.1 3.4* 3.6 3.7  CL 106 106 104 109  CO2 20* 20* 20* 21*  GLUCOSE 131* 89 104* 102*  BUN 27* 23 24* 18  CREATININE 0.77 0.76 0.66 0.74  CALCIUM  9.2 8.7* 8.6* 8.6*   Liver Function Tests: Recent Labs  Lab 02/28/24 1301  AST 26  ALT 45*  ALKPHOS 81  BILITOT 0.9  PROT 7.2  ALBUMIN 3.7   CBG: Recent Labs  Lab 03/02/24 1226 03/02/24 1650 03/02/24 2055 03/03/24 0848 03/03/24 1158  GLUCAP 88 191* 198* 136* 150*    Discharge time spent: greater than 30 minutes.  Signed: Verla Glaze, MD [facility] 03/03/2024

## 2024-03-03 NOTE — Progress Notes (Signed)
 Daily Progress Note   Subjective  - 1 Day Post-Op  Status post fourth toe amputation and partial fifth ray amputation right foot.  Doing well.  No complaints.  States the pain is medically improved from yesterday.  Objective Vitals:   03/02/24 1611 03/03/24 0010 03/03/24 0410 03/03/24 0845  BP: 105/67 98/62 (!) 103/57 114/62  Pulse: 87 79 81 (!) 51  Resp: 17 16 16 17   Temp:  97.6 F (36.4 C) (!) 97.4 F (36.3 C)   TempSrc:   Oral   SpO2: 100% 97% 97% (!) 81%  Weight:      Height:        Physical Exam: Incisions coapted.  Erythema has diminished.  Ulcer to the medial aspect of the great toe is stable.  Culture is pending.       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 Diabetes with peripheral vascular disease  At this point recommend minimal ambulation with postoperative shoe.  Should use walker.  Try to weight-bear on the heel. Cannot discuss dressings to leave this intact until Wednesday and can put in change with dry sterile gauze. He will follow-up with me this Friday. Recommend p.o. Duricef for 5 days.  Will follow cultures outpatient. From podiatry standpoint patient is stable for discharge.  Anell Baptist A  03/03/2024, 11:13 AM

## 2024-03-03 NOTE — Discharge Instructions (Signed)
 Try to minimize ambulation with postoperative shoe and walker.  Should use a wheelchair for longer distances.  Keep weight on heel when ambulatory.  Keep dressing clean dry and intact until Wednesday.  Can then change with dry gauze dressing.  Follow-up with me on Friday.  Take your antibiotics until the prescription has ended.

## 2024-03-03 NOTE — TOC Transition Note (Signed)
 Transition of Care [facility]) - Discharge Note   Patient Details  Name: Alexander [name] MRN: [id number] Date of Birth: June 09, 1951  [facility] CM/SW Contact:  [place] L Dunseith Ducre, LCSW Phone Number: 03/03/2024, 11:48 AM   Clinical Narrative:     CSW spoke with relative, Ms. Stengel, who advised that husband is scheduled for discharge today. HH discussed with spouse who advised of no preferences. Spouse also advised of DME order for wheelchair with cushion.   [employer] contacted. DME order scheduled for home delivery by 03/04/2024.         Patient Goals and CMS Choice            Discharge Placement                       Discharge Plan and Services Additional resources added to the After Visit Summary for                                       Social Drivers of Health (SDOH) Interventions SDOH Screenings   Food Insecurity: No Food Insecurity (02/28/2024)  Housing: Low Risk  (02/28/2024)  Transportation Needs: No Transportation Needs (02/28/2024)  Utilities: Not At Risk (02/28/2024)  Alcohol Screen: Low Risk  (09/29/2023)  Financial Resource Strain: Low Risk  (02/28/2024)   Received from [facility]  Social Connections: Socially Integrated (02/28/2024)  Tobacco Use: Low Risk  (03/02/2024)     Readmission Risk Interventions     No data to display

## 2024-03-04 ENCOUNTER — Encounter: Payer: Self-pay | Admitting: Vascular Surgery

## 2024-03-04 LAB — CULTURE, BLOOD (ROUTINE X 2)
Culture: NO GROWTH
Culture: NO GROWTH

## 2024-03-05 LAB — SURGICAL PATHOLOGY

## 2024-03-07 LAB — AEROBIC/ANAEROBIC CULTURE W GRAM STAIN (SURGICAL/DEEP WOUND)

## 2024-03-12 ENCOUNTER — Encounter (INDEPENDENT_AMBULATORY_CARE_PROVIDER_SITE_OTHER): Payer: Self-pay

## 2024-05-16 ENCOUNTER — Ambulatory Visit (INDEPENDENT_AMBULATORY_CARE_PROVIDER_SITE_OTHER): Admitting: Vascular Surgery

## 2024-05-16 ENCOUNTER — Encounter (INDEPENDENT_AMBULATORY_CARE_PROVIDER_SITE_OTHER)

## 2024-06-05 ENCOUNTER — Ambulatory Visit (INDEPENDENT_AMBULATORY_CARE_PROVIDER_SITE_OTHER): Admitting: Nurse Practitioner

## 2024-06-05 ENCOUNTER — Encounter (INDEPENDENT_AMBULATORY_CARE_PROVIDER_SITE_OTHER)

## 2024-07-02 ENCOUNTER — Other Ambulatory Visit: Payer: Self-pay | Admitting: Cardiology

## 2024-07-02 DIAGNOSIS — I5022 Chronic systolic (congestive) heart failure: Secondary | ICD-10-CM

## 2024-07-02 DIAGNOSIS — G459 Transient cerebral ischemic attack, unspecified: Secondary | ICD-10-CM

## 2024-07-03 ENCOUNTER — Other Ambulatory Visit (INDEPENDENT_AMBULATORY_CARE_PROVIDER_SITE_OTHER): Payer: Self-pay | Admitting: Nurse Practitioner

## 2024-07-03 DIAGNOSIS — I739 Peripheral vascular disease, unspecified: Secondary | ICD-10-CM

## 2024-07-08 ENCOUNTER — Encounter (INDEPENDENT_AMBULATORY_CARE_PROVIDER_SITE_OTHER)

## 2024-07-08 ENCOUNTER — Ambulatory Visit (INDEPENDENT_AMBULATORY_CARE_PROVIDER_SITE_OTHER): Admitting: Nurse Practitioner

## 2024-08-13 ENCOUNTER — Ambulatory Visit (INDEPENDENT_AMBULATORY_CARE_PROVIDER_SITE_OTHER)

## 2024-08-13 ENCOUNTER — Encounter (INDEPENDENT_AMBULATORY_CARE_PROVIDER_SITE_OTHER): Payer: Self-pay | Admitting: Nurse Practitioner

## 2024-08-13 ENCOUNTER — Ambulatory Visit (INDEPENDENT_AMBULATORY_CARE_PROVIDER_SITE_OTHER): Admitting: Nurse Practitioner

## 2024-08-13 VITALS — BP 117/61 | HR 91 | Resp 18 | Ht 72.0 in | Wt 230.6 lb

## 2024-08-13 DIAGNOSIS — Z9889 Other specified postprocedural states: Secondary | ICD-10-CM | POA: Diagnosis not present

## 2024-08-13 DIAGNOSIS — I739 Peripheral vascular disease, unspecified: Secondary | ICD-10-CM

## 2024-08-13 DIAGNOSIS — I10 Essential (primary) hypertension: Secondary | ICD-10-CM | POA: Diagnosis not present

## 2024-08-13 DIAGNOSIS — E785 Hyperlipidemia, unspecified: Secondary | ICD-10-CM | POA: Diagnosis not present

## 2024-08-13 NOTE — Progress Notes (Signed)
 Subjective:    Patient ID: Alexander [name], male    DOB: 17-Jan-1951, 73 y.o.   MRN: [id number] Chief Complaint  Patient presents with   Follow-up    3 month follow up ABI + R LEA    The patient returns to the office for followup and review status post angiogram with intervention on 03/01/2024.   Procedure: Procedure(s) Performed:             1.  Introduction catheter into right lower extremity 3rd order catheter placement               2.    Contrast injection right lower extremity for distal runoff             3.  Percutaneous transluminal angioplasty and stent placement right superficial femoral and popliteal arteries to 6 mm             4.  Percutaneous transluminal angioplasty and stent placement right anterior tibial to 3.5 mm with Esprit stents             5.  Star close closure left common femoral arteriotomy   The patient notes improvement in the lower extremity symptoms. No interval shortening of the patient's claudication distance or rest pain symptoms. No new ulcers or wounds have occurred since the last visit.  He is currently working with podiatry regarding the wound on his right foot and he notes that it is healing well and Dr. Ashley notes he has been making notable improvement.  There have been no significant changes to the patient's overall health care.  No documented history of amaurosis fugax or recent TIA symptoms. There are no recent neurological changes noted. No documented history of DVT, PE or superficial thrombophlebitis. The patient denies recent episodes of angina or shortness of breath.   PIs are noncompressible bilaterally TBI's Rt=0.95 and Lt=0.92  (previous ABI's Rt=0.67 and Lt=0.90) Duplex US  of the lower extremity shows triphasic waveforms throughout with biphasic waveforms in the anterior tibial arteries.  Stents are widely patent.    Review of Systems  Skin:  Positive for wound.  All other systems reviewed and are negative.      Objective:    Physical Exam Vitals reviewed.  HENT:     Head: Normocephalic.  Cardiovascular:     Rate and Rhythm: Normal rate.     Pulses:          Dorsalis pedis pulses are detected w/ Doppler on the right side and detected w/ Doppler on the left side.       Posterior tibial pulses are detected w/ Doppler on the right side and detected w/ Doppler on the left side.  Pulmonary:     Effort: Pulmonary effort is normal.  Neurological:     Mental Status: He is alert.     BP 117/61 (BP Location: Right Arm, Patient Position: Sitting, Cuff Size: Normal)   Pulse 91   Resp 18   Ht 6' (1.829 m)   Wt 230 lb 9.6 oz (104.6 kg)   BMI 31.27 kg/m   Past Medical History:  Diagnosis Date   Acute cholecystitis    Anginal pain    CHF (congestive heart failure) (HCC)    Chronic lower back pain    Coronary artery disease    DM (diabetes mellitus), type 2 (HCC)    Elevated LFTs    Facial cellulitis    Frequent falls    GERD (gastroesophageal reflux disease)  History of concussion    History of kidney stones    Hypertension    Ischemic cardiomyopathy    Metabolic acidosis    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)    Pneumonia 07/05/2023   admitted to [facility]   Polyneuropathy    S/P CABG x 5 2017   Sepsis (HCC) 07/05/2023   Severe nonproliferative diabetic retinopathy of left eye (HCC)    Stroke (HCC) 2014   mini, tingling right hand    Social History   Socioeconomic History   Marital status: Married    Spouse name: Not on file   Number of children: Not on file   Years of education: Not on file   Highest education level: Not on file  Occupational History   Not on file  Tobacco Use   Smoking status: Never    Passive exposure: Never   Smokeless tobacco: Never  Vaping Use   Vaping status: Never Used  Substance and Sexual Activity   Alcohol use: No   Drug use: Never   Sexual activity: Yes    Partners: Female  Other Topics Concern   Not on file  Social History  Narrative   Not on file   Social Drivers of Health   Financial Resource Strain: Low Risk  (02/28/2024)   Received from [facility]   Overall Financial Resource Strain (CARDIA)    Difficulty of Paying Living Expenses: Not hard at all  Food Insecurity: No Food Insecurity (02/28/2024)   Hunger Vital Sign    Worried About Running Out of Food in the Last Year: Never true    Ran Out of Food in the Last Year: Never true  Transportation Needs: No Transportation Needs (02/28/2024)   PRAPARE - Administrator, Civil Service (Medical): No    Lack of Transportation (Non-Medical): No  Physical Activity: Not on file  Stress: Not on file  Social Connections: Socially Integrated (02/28/2024)   Social Connection and Isolation Panel    Frequency of Communication with Friends and Family: Three times a week    Frequency of Social Gatherings with Friends and Family: Twice a week    Attends Religious Services: 1 to 4 times per year    Active Member of [employer] or Organizations: Yes    Attends Banker Meetings: 1 to 4 times per year    Marital Status: Married  Catering manager Violence: Not At Risk (02/28/2024)   Humiliation, Afraid, Rape, and Kick questionnaire    Fear of Current or Ex-Partner: No    Emotionally Abused: No    Physically Abused: No    Sexually Abused: No    Past Surgical History:  Procedure Laterality Date   AMPUTATION TOE Right 01/21/2024   Procedure: AMPUTATION, TOE;  Surgeon: Ashley Soulier, DPM;  Location: [facility];  Service: Orthopedics/Podiatry;  Laterality: Right;   AMPUTATION TOE Right 03/02/2024   Procedure: AMPUTATION, TOE;  Surgeon: Ashley Soulier, DPM;  Location: [facility];  Service: Orthopedics/Podiatry;  Laterality: Right;  4th and 5th toes   CARDIAC CATHETERIZATION Left 08/09/2016   Procedure: Left Heart Cath and Coronary Angiography;  Surgeon: Vinie DELENA Jude, MD;  Location: [facility];  Service: Cardiovascular;  Laterality: Left;    CAROTID PTA/STENT INTERVENTION Left 09/28/2023   Procedure: CAROTID PTA/STENT INTERVENTION;  Surgeon: Jama Cordella MATSU, MD;  Location: [facility];  Service: Cardiovascular;  Laterality: Left;   CATARACT EXTRACTION W/PHACO Right 02/06/2023   Procedure: CATARACT EXTRACTION  PHACO AND INTRAOCULAR LENS PLACEMENT (IOC) AHMED TUBE SHUNT W/ TUTOPLAST RIGHT DIABETIC  3.94   00:42.6;  Surgeon: Myrna Adine Anes, MD;  Location: [facility];  Service: Ophthalmology;  Laterality: Right;   COLONOSCOPY WITH PROPOFOL  N/A 02/02/2021   Procedure: COLONOSCOPY WITH PROPOFOL ;  Surgeon: Maryruth Ole DASEN, MD;  Location: [facility];  Service: Endoscopy;  Laterality: N/A;   CORONARY ARTERY BYPASS GRAFT  2017   x5   IR CHOLANGIOGRAM EXISTING TUBE  12/11/2023   IR EXCHANGE BILIARY DRAIN  09/01/2023   IR EXCHANGE BILIARY DRAIN  11/17/2023   IR PERC CHOLECYSTOSTOMY  07/28/2023   IR RADIOLOGIST EVAL & MGMT  11/17/2023   IRRIGATION AND DEBRIDEMENT FOOT Right 01/21/2024   Procedure: IRRIGATION AND DEBRIDEMENT FOOT;  Surgeon: Ashley Soulier, DPM;  Location: [facility];  Service: Orthopedics/Podiatry;  Laterality: Right;   LOWER EXTREMITY ANGIOGRAPHY Right 01/19/2024   Procedure: Lower Extremity Angiography;  Surgeon: Jama Cordella MATSU, MD;  Location: [facility];  Service: Cardiovascular;  Laterality: Right;   LOWER EXTREMITY ANGIOGRAPHY Right 03/01/2024   Procedure: LOWER EXTREMITY ANGIOGRAPHY;  Surgeon: Jama Cordella MATSU, MD;  Location: [facility];  Service: Cardiovascular;  Laterality: Right;    Family History  Problem Relation Age of Onset   Hypertension Mother    Diabetes Mother    Cirrhosis Father    Anuerysm Sister    CAD Brother    Cancer Brother     No Known Allergies     Latest Ref Rng & Units 03/02/2024    2:04 AM 03/01/2024    5:50 AM 02/29/2024    3:24 AM  CBC  WBC 4.0 - 10.5 K/uL 7.1  6.3  8.8   Hemoglobin 13.0 - 17.0 g/dL 86.4  86.1  87.0   Hematocrit 39.0  - 52.0 % 39.1  40.3  39.3   Platelets 150 - 400 K/uL 226  237  258       CMP     Component Value Date/Time   NA 137 03/02/2024 0204   K 3.7 03/02/2024 0204   CL 109 03/02/2024 0204   CO2 21 (L) 03/02/2024 0204   GLUCOSE 102 (H) 03/02/2024 0204   BUN 18 03/02/2024 0204   CREATININE 0.74 03/02/2024 0204   CALCIUM  8.6 (L) 03/02/2024 0204   PROT 7.2 02/28/2024 1301   ALBUMIN 3.7 02/28/2024 1301   AST 26 02/28/2024 1301   ALT 45 (H) 02/28/2024 1301   ALKPHOS 81 02/28/2024 1301   BILITOT 0.9 02/28/2024 1301   GFRNONAA >60 03/02/2024 0204     No results found.     Assessment & Plan:   1. Peripheral arterial disease with history of revascularization (Primary) Recommend:  The patient is status post successful angiogram with intervention.  The patient reports that the claudication symptoms and leg pain has improved.   The patient denies lifestyle limiting changes at this point in time.  No further invasive studies, angiography or surgery at this time. The patient should continue walking and begin a more formal exercise program.  The patient should continue antiplatelet therapy and aggressive treatment of the lipid abnormalities  Continued surveillance is indicated as atherosclerosis is likely to progress with time.    Patient should undergo noninvasive studies as ordered. The patient will follow up with me to review the studies.   2. Primary hypertension Continue antihypertensive medications as already ordered, these medications have been reviewed and there are no changes at this time.  3. Hyperlipidemia, unspecified  hyperlipidemia type Continue statin as ordered and reviewed, no changes at this time   Current Outpatient Medications on File Prior to Visit  Medication Sig Dispense Refill   acetaminophen  (TYLENOL ) 500 MG tablet Take 1,000 mg by mouth every 6 (six) hours as needed for mild pain or headache.     aspirin  EC 81 MG tablet Take 1 tablet (81 mg total) by mouth  daily. 30 tablet 11   atorvastatin  (LIPITOR ) 80 MG tablet Take 1 tablet (80 mg total) by mouth daily. Hold until you see your doctor and have your liver enzymes repeated     LANTUS  SOLOSTAR 100 UNIT/ML Solostar Pen Inject 25 Units into the skin at bedtime. (Patient taking differently: Inject 36 Units into the skin at bedtime.)     oxyCODONE  (OXY IR/ROXICODONE ) 5 MG immediate release tablet Take 1 tablet (5 mg total) by mouth every 6 (six) hours as needed for severe pain (pain score 7-10). 16 tablet 0   OZEMPIC, 2 MG/DOSE, 8 MG/3ML SOPN Inject 2 mg into the skin every 7 (seven) days. Sundays     sacubitril -valsartan  (ENTRESTO ) 24-26 MG Take 1 tablet by mouth every 12 (twelve) hours. 60 tablet 11   spironolactone  (ALDACTONE ) 25 MG tablet Take 12.5 mg by mouth daily.     JARDIANCE  25 MG TABS tablet Take 25 mg by mouth daily. (Patient not taking: Reported on 08/13/2024)     No current facility-administered medications on file prior to visit.    There are no Patient Instructions on file for this visit. No follow-ups on file.   Myeshia Fojtik E Hamad Whyte, NP

## 2024-08-14 LAB — VAS US ABI WITH/WO TBI
Left ABI: >1
Right ABI: >1

## 2025-02-11 ENCOUNTER — Encounter (INDEPENDENT_AMBULATORY_CARE_PROVIDER_SITE_OTHER)

## 2025-02-11 ENCOUNTER — Ambulatory Visit (INDEPENDENT_AMBULATORY_CARE_PROVIDER_SITE_OTHER): Admitting: Nurse Practitioner
